# Patient Record
Sex: Female | Born: 1995 | Race: White | Hispanic: No | Marital: Single | Smoking: Former smoker
Health system: Southern US, Community
[De-identification: ages and names within clinical notes are randomized; demographics above are authoritative.]

## PROBLEM LIST (undated history)

## (undated) DIAGNOSIS — Z8679 Personal history of other diseases of the circulatory system: Secondary | ICD-10-CM

## (undated) DIAGNOSIS — T7840XA Allergy, unspecified, initial encounter: Secondary | ICD-10-CM

## (undated) DIAGNOSIS — Z86711 Personal history of pulmonary embolism: Secondary | ICD-10-CM

## (undated) DIAGNOSIS — F909 Attention-deficit hyperactivity disorder, unspecified type: Secondary | ICD-10-CM

## (undated) HISTORY — DX: Attention-deficit hyperactivity disorder, unspecified type: F90.9

## (undated) HISTORY — PX: NO PAST SURGERIES: SHX2092

## (undated) HISTORY — DX: Personal history of other diseases of the circulatory system: Z86.79

## (undated) HISTORY — DX: Allergy, unspecified, initial encounter: T78.40XA

## (undated) HISTORY — DX: Personal history of pulmonary embolism: Z86.711

---
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
## 2013-02-05 ENCOUNTER — Institutional Professional Consult (permissible substitution): Payer: BC Managed Care – PPO | Admitting: Pediatrics

## 2013-04-21 ENCOUNTER — Institutional Professional Consult (permissible substitution) (INDEPENDENT_AMBULATORY_CARE_PROVIDER_SITE_OTHER): Payer: BC Managed Care – PPO | Admitting: Family

## 2013-04-21 DIAGNOSIS — F909 Attention-deficit hyperactivity disorder, unspecified type: Secondary | ICD-10-CM

## 2013-09-01 ENCOUNTER — Institutional Professional Consult (permissible substitution) (INDEPENDENT_AMBULATORY_CARE_PROVIDER_SITE_OTHER): Payer: BC Managed Care – PPO | Admitting: Family

## 2013-09-01 DIAGNOSIS — F909 Attention-deficit hyperactivity disorder, unspecified type: Secondary | ICD-10-CM

## 2014-01-20 ENCOUNTER — Institutional Professional Consult (permissible substitution) (INDEPENDENT_AMBULATORY_CARE_PROVIDER_SITE_OTHER): Payer: BC Managed Care – PPO | Admitting: Family

## 2014-01-20 DIAGNOSIS — F902 Attention-deficit hyperactivity disorder, combined type: Secondary | ICD-10-CM

## 2014-01-20 DIAGNOSIS — F8181 Disorder of written expression: Secondary | ICD-10-CM

## 2014-04-16 ENCOUNTER — Institutional Professional Consult (permissible substitution) (INDEPENDENT_AMBULATORY_CARE_PROVIDER_SITE_OTHER): Payer: BLUE CROSS/BLUE SHIELD | Admitting: Family

## 2014-04-16 DIAGNOSIS — F8181 Disorder of written expression: Secondary | ICD-10-CM | POA: Diagnosis not present

## 2014-04-16 DIAGNOSIS — F902 Attention-deficit hyperactivity disorder, combined type: Secondary | ICD-10-CM | POA: Diagnosis not present

## 2014-08-20 ENCOUNTER — Institutional Professional Consult (permissible substitution) (INDEPENDENT_AMBULATORY_CARE_PROVIDER_SITE_OTHER): Payer: BLUE CROSS/BLUE SHIELD | Admitting: Family

## 2014-08-20 DIAGNOSIS — F900 Attention-deficit hyperactivity disorder, predominantly inattentive type: Secondary | ICD-10-CM | POA: Diagnosis not present

## 2014-08-20 DIAGNOSIS — F8181 Disorder of written expression: Secondary | ICD-10-CM | POA: Diagnosis not present

## 2014-12-08 ENCOUNTER — Institutional Professional Consult (permissible substitution) (INDEPENDENT_AMBULATORY_CARE_PROVIDER_SITE_OTHER): Payer: BLUE CROSS/BLUE SHIELD | Admitting: Family

## 2014-12-08 DIAGNOSIS — F8181 Disorder of written expression: Secondary | ICD-10-CM | POA: Diagnosis not present

## 2014-12-08 DIAGNOSIS — F900 Attention-deficit hyperactivity disorder, predominantly inattentive type: Secondary | ICD-10-CM | POA: Diagnosis not present

## 2015-03-09 ENCOUNTER — Institutional Professional Consult (permissible substitution) (INDEPENDENT_AMBULATORY_CARE_PROVIDER_SITE_OTHER): Payer: BLUE CROSS/BLUE SHIELD | Admitting: Family

## 2015-03-09 DIAGNOSIS — F8181 Disorder of written expression: Secondary | ICD-10-CM

## 2015-03-09 DIAGNOSIS — F902 Attention-deficit hyperactivity disorder, combined type: Secondary | ICD-10-CM | POA: Diagnosis not present

## 2015-06-23 ENCOUNTER — Institutional Professional Consult (permissible substitution): Payer: Self-pay | Admitting: Family

## 2015-06-23 ENCOUNTER — Telehealth: Payer: Self-pay | Admitting: Family

## 2015-06-23 NOTE — Telephone Encounter (Signed)
Mom called and canceled due to family  emergency .

## 2015-06-28 ENCOUNTER — Encounter: Payer: Self-pay | Admitting: Family

## 2015-06-28 ENCOUNTER — Ambulatory Visit (INDEPENDENT_AMBULATORY_CARE_PROVIDER_SITE_OTHER): Payer: BLUE CROSS/BLUE SHIELD | Admitting: Family

## 2015-06-28 VITALS — BP 122/70 | HR 78 | Resp 20 | Ht 67.75 in | Wt 151.2 lb

## 2015-06-28 DIAGNOSIS — R278 Other lack of coordination: Secondary | ICD-10-CM | POA: Diagnosis not present

## 2015-06-28 DIAGNOSIS — F902 Attention-deficit hyperactivity disorder, combined type: Secondary | ICD-10-CM

## 2015-06-28 MED ORDER — BUPROPION HCL 75 MG PO TABS: 75.0000 mg | ORAL_TABLET | Freq: Two times a day (BID) | ORAL | Status: DC

## 2015-06-28 MED ORDER — LISDEXAMFETAMINE DIMESYLATE 50 MG PO CAPS: 50.0000 mg | ORAL_CAPSULE | Freq: Every day | ORAL | Status: DC

## 2015-06-28 NOTE — Progress Notes (Signed)
[facility] [address] [place] [address] Dept: [phone] Dept Fax: [phone] Loc: [phone] Loc Fax: [phone]  Medical Follow-up  Patient ID: Phyllis Goodman, female  DOB: 03-23-1995, 20 y.o.  MRN: [id number]  Date of Evaluation: 06/28/15   PCP: Mickie HillierLITTLE,KEVIN LORNE, MD  Accompanied by: Self and mother Patient Lives with: parents  HISTORY/CURRENT STATUS:  HPI  Patient here for routine follow up related to ADHD and medication management. Polite and cooperative at today's visit. Patient taking medication-Vyvanse 50 mg 2 daily most work/school days without side effects for adverse effects.   EDUCATION: School: [employer] Year/Grade: Freshman Homework Time: Increased time depending on class demands Performance/Grades: below average Services: IEP/504 Plan and Other: tutoring Activities/Exercise: intermittently Current Exercise Habits: Home exercise routine, Type of exercise: walking, Time (Minutes): 30, Frequency (Times/Week): 3, Weekly Exercise (Minutes/Week): 90, Intensity: Mild Exercise limited by: None identified  MEDICAL HISTORY: Appetite: Ok MVI/Other: None Fruits/Vegs:Some Calcium: Some Iron:Some  Sleep: Bedtime: 10-Midnight Awakens: 7-8;30 am Sleep Concerns: Initiation/Maintenance/Other: No problems  Individual Medical History/Review of System Changes? No  Allergies: Review of patient's allergies indicates no known allergies.  Current Medications:  Current outpatient prescriptions:  .  lisdexamfetamine (VYVANSE) 50 MG capsule, Take 50 mg by mouth daily. Take 2 tablets by mouth daily, Disp: , Rfl:  Medication Side Effects: None  Family Medical/Social History Changes?: No  MENTAL HEALTH: Mental Health Issues: None reported  Depression screen [facility] 2/9 06/28/2015 06/28/2015  Decreased Interest - 0  Down, Depressed, Hopeless  1 0  PHQ - 2 Score 1 0     PHYSICAL EXAM: Vitals:  Today's Vitals   06/28/15 1103  Height: 5' 7.75" (1.721 m)  Weight: 151 lb 3.2 oz (68.584 kg)  , Facility age limit for growth percentiles is 20 years.  General Exam: Physical Exam  Constitutional: She is oriented to person, place, and time. She appears well-developed and well-nourished.  HENT:  Head: Normocephalic and atraumatic.  Right Ear: External ear normal.  Left Ear: External ear normal.  Nose: Nose normal.  Mouth/Throat: Oropharynx is clear and moist.  Eyes: Conjunctivae and EOM are normal. Pupils are equal, round, and reactive to light.  Neck: Normal range of motion. Neck supple.  Cardiovascular: Normal rate, regular rhythm, normal heart sounds and intact distal pulses.   Pulmonary/Chest: Effort normal and breath sounds normal.  Abdominal: Soft. Bowel sounds are normal.  Musculoskeletal: Normal range of motion.  Neurological: She is alert and oriented to person, place, and time. She has normal reflexes.  Skin: Skin is warm and dry.  Psychiatric: She has a normal mood and affect. Her behavior is normal. Judgment and thought content normal.  Vitals reviewed.   Neurological: oriented to time, place, and person Cranial Nerves: normal  Neuromuscular:  Motor Mass: Normal Tone: Normal Strength: Normal DTRs: 2+ and symmetric Overflow: None Reflexes: no tremors noted Sensory Exam: Vibratory: Intact  Fine Touch: Intact  Testing/Developmental Screens: ASRS-15/10 mins    DIAGNOSES:    ICD-9-CM ICD-10-CM   1. ADHD (attention deficit hyperactivity disorder), combined type 314.01 F90.2   2. Dysgraphia 781.3 R27.8     RECOMMENDATIONS: 3 month follow up visit and continuation with medication.  Patient Instructions  Patient to start with Wellbutrin 75 mg 1 daily in the am for 7-10 days and can increase to 1 tablet twice daily. Reviewed use, dose, and side effects of medication. Will continue with Vyvanse 50 mg  2  capsules daily, as previous. To return in about 8 weeks for medication management of Wellbutrin. To call prior to the appointment if concerns with side effects or adverse effects of medication.    Continuation of daily oral hygiene to include flossing and brushing daily, using antimicrobial toothpaste, as well as routine dental exams and twice yearly cleaning.  Recommend supplementation with a  multivitamin and omega-3 fatty acids daily.  Maintain adequate intake of Calcium and Vitamin D.  NEXT APPOINTMENT: No Follow-up on file.  More than 50% of the appointment was spent counseling and discussing diagnosis and management of symptoms with the patient and family.   Carron Curie, NP Counseling Time: 30 mins Total Contact Time: 40 mins

## 2015-06-28 NOTE — Patient Instructions (Addendum)
Patient to start with Wellbutrin 75 mg 1 daily in the am for 7-10 days and can increase to 1 tablet twice daily. Reviewed use, dose, and side effects of medication. Will continue with Vyvanse 50 mg 2 capsules daily, as previous. To return in about 8 weeks for medication management of Wellbutrin. To call prior to the appointment if concerns with side effects or adverse effects of medication.

## 2015-07-14 ENCOUNTER — Ambulatory Visit: Payer: Self-pay | Admitting: Women's Health

## 2015-07-24 DIAGNOSIS — F4323 Adjustment disorder with mixed anxiety and depressed mood: Secondary | ICD-10-CM | POA: Diagnosis not present

## 2015-07-28 ENCOUNTER — Ambulatory Visit: Payer: Self-pay | Admitting: Women's Health

## 2015-07-28 ENCOUNTER — Encounter: Payer: Self-pay | Admitting: Women's Health

## 2015-07-28 ENCOUNTER — Telehealth: Payer: Self-pay

## 2015-07-28 ENCOUNTER — Ambulatory Visit (INDEPENDENT_AMBULATORY_CARE_PROVIDER_SITE_OTHER): Payer: BLUE CROSS/BLUE SHIELD | Admitting: Women's Health

## 2015-07-28 VITALS — BP 115/69 | Ht 67.0 in | Wt 154.4 lb

## 2015-07-28 DIAGNOSIS — Z01419 Encounter for gynecological examination (general) (routine) without abnormal findings: Secondary | ICD-10-CM | POA: Diagnosis not present

## 2015-07-28 DIAGNOSIS — F1911 Other psychoactive substance abuse, in remission: Secondary | ICD-10-CM | POA: Insufficient documentation

## 2015-07-28 DIAGNOSIS — Z30011 Encounter for initial prescription of contraceptive pills: Secondary | ICD-10-CM | POA: Diagnosis not present

## 2015-07-28 DIAGNOSIS — Z113 Encounter for screening for infections with a predominantly sexual mode of transmission: Secondary | ICD-10-CM

## 2015-07-28 DIAGNOSIS — F1990 Other psychoactive substance use, unspecified, uncomplicated: Secondary | ICD-10-CM

## 2015-07-28 DIAGNOSIS — R87610 Atypical squamous cells of undetermined significance on cytologic smear of cervix (ASC-US): Secondary | ICD-10-CM | POA: Diagnosis not present

## 2015-07-28 LAB — CBC WITH DIFFERENTIAL/PLATELET
BASOS ABS: 0 {cells}/uL (ref 0–200)
Basophils Relative: 0 %
Eosinophils Absolute: 124 cells/uL (ref 15–500)
Eosinophils Relative: 2 %
HEMATOCRIT: 41.8 % (ref 35.0–45.0)
HEMOGLOBIN: 13.9 g/dL (ref 11.7–15.5)
LYMPHS ABS: 1860 {cells}/uL (ref 850–3900)
Lymphocytes Relative: 30 %
MCH: 31.3 pg (ref 27.0–33.0)
MCHC: 33.3 g/dL (ref 32.0–36.0)
MCV: 94.1 fL (ref 80.0–100.0)
MONO ABS: 558 {cells}/uL (ref 200–950)
MPV: 10 fL (ref 7.5–12.5)
Monocytes Relative: 9 %
NEUTROS ABS: 3658 {cells}/uL (ref 1500–7800)
NEUTROS PCT: 59 %
Platelets: 316 10*3/uL (ref 140–400)
RBC: 4.44 MIL/uL (ref 3.80–5.10)
RDW: 13.3 % (ref 11.0–15.0)
WBC: 6.2 10*3/uL (ref 3.8–10.8)

## 2015-07-28 MED ORDER — NORETHIN ACE-ETH ESTRAD-FE 1-20 MG-MCG PO TABS: 1.0000 | ORAL_TABLET | Freq: Every day | ORAL | Status: DC

## 2015-07-28 NOTE — Telephone Encounter (Signed)
Mom called. 20 yo patient has her first gyn visit with you at 2pm today.  Mom wanted to make you aware of some things. She said patient is using drugs. "Drugs like heroin".  We are trying to help her and she is seeing a Veterinary surgeoncounselor.  She said that patient does not tell the truth all the time and is very good at convincing.  She said that patient needs birth control but is very forgetful.  She is on Vyvanse (sp?) every day and cannot remember to take that daily. She hopes that birth control method will be something that will not require her to remember to take it.   You do not have to call Mom. She just wanted you to be aware.

## 2015-07-28 NOTE — Patient Instructions (Signed)
Health Maintenance, Female Adopting a healthy lifestyle and getting preventive care can go a long way to promote health and wellness. Talk with your health care provider about what schedule of regular examinations is right for you. This is a good chance for you to check in with your provider about disease prevention and staying healthy. In between checkups, there are plenty of things you can do on your own. Experts have done a lot of research about which lifestyle changes and preventive measures are most likely to keep you healthy. Ask your health care provider for more information. WEIGHT AND DIET  Eat a healthy diet  Be sure to include plenty of vegetables, fruits, low-fat dairy products, and lean protein.  Do not eat a lot of foods high in solid fats, added sugars, or salt.  Get regular exercise. This is one of the most important things you can do for your health.  Most adults should exercise for at least 150 minutes each week. The exercise should increase your heart rate and make you sweat (moderate-intensity exercise).  Most adults should also do strengthening exercises at least twice a week. This is in addition to the moderate-intensity exercise.  Maintain a healthy weight  Body mass index (BMI) is a measurement that can be used to identify possible weight problems. It estimates body fat based on height and weight. Your health care provider can help determine your BMI and help you achieve or maintain a healthy weight.  For females 20 years of age and older:   A BMI below 18.5 is considered underweight.  A BMI of 18.5 to 24.9 is normal.  A BMI of 25 to 29.9 is considered overweight.  A BMI of 30 and above is considered obese.  Watch levels of cholesterol and blood lipids  You should start having your blood tested for lipids and cholesterol at 22 years of age, then have this test every 5 years.  You may need to have your cholesterol levels checked more often if:  Your lipid  or cholesterol levels are high.  You are older than 22 years of age.  You are at high risk for heart disease.  CANCER SCREENING   Lung Cancer  Lung cancer screening is recommended for adults 55-80 years old who are at high risk for lung cancer because of a history of smoking.  A yearly low-dose CT scan of the lungs is recommended for people who:  Currently smoke.  Have quit within the past 15 years.  Have at least a 30-pack-year history of smoking. A pack year is smoking an average of one pack of cigarettes a day for 1 year.  Yearly screening should continue until it has been 15 years since you quit.  Yearly screening should stop if you develop a health problem that would prevent you from having lung cancer treatment.  Breast Cancer  Practice breast self-awareness. This means understanding how your breasts normally appear and feel.  It also means doing regular breast self-exams. Let your health care provider know about any changes, no matter how small.  If you are in your 20s or 30s, you should have a clinical breast exam (CBE) by a health care provider every 1-3 years as part of a regular health exam.  If you are 40 or older, have a CBE every year. Also consider having a breast X-ray (mammogram) every year.  If you have a family history of breast cancer, talk to your health care provider about genetic screening.  If you   are at high risk for breast cancer, talk to your health care provider about having an MRI and a mammogram every year.  Breast cancer gene (BRCA) assessment is recommended for women who have family members with BRCA-related cancers. BRCA-related cancers include:  Breast.  Ovarian.  Tubal.  Peritoneal cancers.  Results of the assessment will determine the need for genetic counseling and BRCA1 and BRCA2 testing. Cervical Cancer Your health care provider may recommend that you be screened regularly for cancer of the pelvic organs (ovaries, uterus, and  vagina). This screening involves a pelvic examination, including checking for microscopic changes to the surface of your cervix (Pap test). You may be encouraged to have this screening done every 3 years, beginning at age 21.  For women ages 30-65, health care providers may recommend pelvic exams and Pap testing every 3 years, or they may recommend the Pap and pelvic exam, combined with testing for human papilloma virus (HPV), every 5 years. Some types of HPV increase your risk of cervical cancer. Testing for HPV may also be done on women of any age with unclear Pap test results.  Other health care providers may not recommend any screening for nonpregnant women who are considered low risk for pelvic cancer and who do not have symptoms. Ask your health care provider if a screening pelvic exam is right for you.  If you have had past treatment for cervical cancer or a condition that could lead to cancer, you need Pap tests and screening for cancer for at least 20 years after your treatment. If Pap tests have been discontinued, your risk factors (such as having a new sexual partner) need to be reassessed to determine if screening should resume. Some women have medical problems that increase the chance of getting cervical cancer. In these cases, your health care provider may recommend more frequent screening and Pap tests. Colorectal Cancer  This type of cancer can be detected and often prevented.  Routine colorectal cancer screening usually begins at 22 years of age and continues through 22 years of age.  Your health care provider may recommend screening at an earlier age if you have risk factors for colon cancer.  Your health care provider may also recommend using home test kits to check for hidden blood in the stool.  A small camera at the end of a tube can be used to examine your colon directly (sigmoidoscopy or colonoscopy). This is done to check for the earliest forms of colorectal  cancer.  Routine screening usually begins at age 50.  Direct examination of the colon should be repeated every 5-10 years through 22 years of age. However, you may need to be screened more often if early forms of precancerous polyps or small growths are found. Skin Cancer  Check your skin from head to toe regularly.  Tell your health care provider about any new moles or changes in moles, especially if there is a change in a mole's shape or color.  Also tell your health care provider if you have a mole that is larger than the size of a pencil eraser.  Always use sunscreen. Apply sunscreen liberally and repeatedly throughout the day.  Protect yourself by wearing long sleeves, pants, a wide-brimmed hat, and sunglasses whenever you are outside. HEART DISEASE, DIABETES, AND HIGH BLOOD PRESSURE   High blood pressure causes heart disease and increases the risk of stroke. High blood pressure is more likely to develop in:  People who have blood pressure in the high end   of the normal range (130-139/85-89 mm Hg).  People who are overweight or obese.  People who are African American.  If you are 38-23 years of age, have your blood pressure checked every 3-5 years. If you are 61 years of age or older, have your blood pressure checked every year. You should have your blood pressure measured twice--once when you are at a hospital or clinic, and once when you are not at a hospital or clinic. Record the average of the two measurements. To check your blood pressure when you are not at a hospital or clinic, you can use:  An automated blood pressure machine at a pharmacy.  A home blood pressure monitor.  If you are between 45 years and 39 years old, ask your health care provider if you should take aspirin to prevent strokes.  Have regular diabetes screenings. This involves taking a blood sample to check your fasting blood sugar level.  If you are at a normal weight and have a low risk for diabetes,  have this test once every three years after 22 years of age.  If you are overweight and have a high risk for diabetes, consider being tested at a younger age or more often. PREVENTING INFECTION  Hepatitis B  If you have a higher risk for hepatitis B, you should be screened for this virus. You are considered at high risk for hepatitis B if:  You were born in a country where hepatitis B is common. Ask your health care provider which countries are considered high risk.  Your parents were born in a high-risk country, and you have not been immunized against hepatitis B (hepatitis B vaccine).  You have HIV or AIDS.  You use needles to inject street drugs.  You live with someone who has hepatitis B.  You have had sex with someone who has hepatitis B.  You get hemodialysis treatment.  You take certain medicines for conditions, including cancer, organ transplantation, and autoimmune conditions. Hepatitis C  Blood testing is recommended for:  Everyone born from [birth year] through 1967.  Anyone with known risk factors for hepatitis C. Sexually transmitted infections (STIs)  You should be screened for sexually transmitted infections (STIs) including gonorrhea and chlamydia if:  You are sexually active and are younger than 22 years of age.  You are older than 22 years of age and your health care provider tells you that you are at risk for this type of infection.  Your sexual activity has changed since you were last screened and you are at an increased risk for chlamydia or gonorrhea. Ask your health care provider if you are at risk.  If you do not have HIV, but are at risk, it may be recommended that you take a prescription medicine daily to prevent HIV infection. This is called pre-exposure prophylaxis (PrEP). You are considered at risk if:  You are sexually active and do not regularly use condoms or know the HIV status of your partner(s).  You take drugs by injection.  You are sexually  active with a partner who has HIV. Talk with your health care provider about whether you are at high risk of being infected with HIV. If you choose to begin PrEP, you should first be tested for HIV. You should then be tested every 3 months for as long as you are taking PrEP.  PREGNANCY   If you are premenopausal and you may become pregnant, ask your health care provider about preconception counseling.  If you may  become pregnant, take 400 to 800 micrograms (mcg) of folic acid every day.  If you want to prevent pregnancy, talk to your health care provider about birth control (contraception). OSTEOPOROSIS AND MENOPAUSE   Osteoporosis is a disease in which the bones lose minerals and strength with aging. This can result in serious bone fractures. Your risk for osteoporosis can be identified using a bone density scan.  If you are 65 years of age or older, or if you are at risk for osteoporosis and fractures, ask your health care provider if you should be screened.  Ask your health care provider whether you should take a calcium or vitamin D supplement to lower your risk for osteoporosis.  Menopause may have certain physical symptoms and risks.  Hormone replacement therapy may reduce some of these symptoms and risks. Talk to your health care provider about whether hormone replacement therapy is right for you.  HOME CARE INSTRUCTIONS   Schedule regular health, dental, and eye exams.  Stay current with your immunizations.   Do not use any tobacco products including cigarettes, chewing tobacco, or electronic cigarettes.  If you are pregnant, do not drink alcohol.  If you are breastfeeding, limit how much and how often you drink alcohol.  Limit alcohol intake to no more than 1 drink per day for nonpregnant women. One drink equals 12 ounces of beer, 5 ounces of wine, or 1 ounces of hard liquor.  Do not use street drugs.  Do not share needles.  Ask your health care provider for help if  you need support or information about quitting drugs.  Tell your health care provider if you often feel depressed.  Tell your health care provider if you have ever been abused or do not feel safe at home.   This information is not intended to replace advice given to you by your health care provider. Make sure you discuss any questions you have with your health care provider.   Document Released: 07/25/2010 Document Revised: 01/30/2014 Document Reviewed: 12/11/2012 Elsevier Interactive Patient Education 2016 Else[employer] Inc[employer]       Contraception Choices Contraception (birth control) is the use of any methods or devices to prevent pregnancy. Below are some methods to help avoid pregnancy. HORMONAL METHODS   Contraceptive implant. This is a thin, plastic tube containing progesterone hormone. It does not contain estrogen hormone. Your health care provider inserts the tube in the inner part of the upper arm. The tube can remain in place for up to 3 years. After 3 years, the implant must be removed. The implant prevents the ovaries from releasing an egg (ovulation), thickens the cervical mucus to prevent sperm from entering the uterus, and thins the lining of the inside of the uterus.  Progesterone-only injections. These injections are given every 3 months by your health care provider to prevent pregnancy. This synthetic progesterone hormone stops the ovaries from releasing eggs. It also thickens cervical mucus and changes the uterine lining. This makes it harder for sperm to survive in the uterus.  Birth control pills. These pills contain estrogen and progesterone hormone. They work by preventing the ovaries from releasing eggs (ovulation). They also cause the cervical mucus to thicken, preventing the sperm from entering the uterus. Birth control pills are prescribed by a health care provider.Birth control pills can also be used to treat heavy periods.  Minipill. This type of birth control pill  contains only the progesterone hormone. They are taken every day of each month and must be prescribed   by your health care provider.  Birth control patch. The patch contains hormones similar to those in birth control pills. It must be changed once a week and is prescribed by a health care provider.  Vaginal ring. The ring contains hormones similar to those in birth control pills. It is left in the vagina for 3 weeks, removed for 1 week, and then a new one is put back in place. The patient must be comfortable inserting and removing the ring from the vagina.A health care provider's prescription is necessary.  Emergency contraception. Emergency contraceptives prevent pregnancy after unprotected sexual intercourse. This pill can be taken right after sex or up to 5 days after unprotected sex. It is most effective the sooner you take the pills after having sexual intercourse. Most emergency contraceptive pills are available without a prescription. Check with your pharmacist. Do not use emergency contraception as your only form of birth control. BARRIER METHODS   Female condom. This is a thin sheath (latex or rubber) that is worn over the penis during sexual intercourse. It can be used with spermicide to increase effectiveness.  Female condom. This is a soft, loose-fitting sheath that is put into the vagina before sexual intercourse.  Diaphragm. This is a soft, latex, dome-shaped barrier that must be fitted by a health care provider. It is inserted into the vagina, along with a spermicidal jelly. It is inserted before intercourse. The diaphragm should be left in the vagina for 6 to 8 hours after intercourse.  Cervical cap. This is a round, soft, latex or plastic cup that fits over the cervix and must be fitted by a health care provider. The cap can be left in place for up to 48 hours after intercourse.  Sponge. This is a soft, circular piece of polyurethane foam. The sponge has spermicide in it. It is  inserted into the vagina after wetting it and before sexual intercourse.  Spermicides. These are chemicals that kill or block sperm from entering the cervix and uterus. They come in the form of creams, jellies, suppositories, foam, or tablets. They do not require a prescription. They are inserted into the vagina with an applicator before having sexual intercourse. The process must be repeated every time you have sexual intercourse. INTRAUTERINE CONTRACEPTION  Intrauterine device (IUD). This is a T-shaped device that is put in a woman's uterus during a menstrual period to prevent pregnancy. There are 2 types:  Copper IUD. This type of IUD is wrapped in copper wire and is placed inside the uterus. Copper makes the uterus and fallopian tubes produce a fluid that kills sperm. It can stay in place for 10 years.  Hormone IUD. This type of IUD contains the hormone progestin (synthetic progesterone). The hormone thickens the cervical mucus and prevents sperm from entering the uterus, and it also thins the uterine lining to prevent implantation of a fertilized egg. The hormone can weaken or kill the sperm that get into the uterus. It can stay in place for 3-5 years, depending on which type of IUD is used. PERMANENT METHODS OF CONTRACEPTION  Female tubal ligation. This is when the woman's fallopian tubes are surgically sealed, tied, or blocked to prevent the egg from traveling to the uterus.  Hysteroscopic sterilization. This involves placing a small coil or insert into each fallopian tube. Your doctor uses a technique called hysteroscopy to do the procedure. The device causes scar tissue to form. This results in permanent blockage of the fallopian tubes, so the sperm cannot fertilize the   after the procedure for the tubes to become blocked. You must use another form of birth control for these 3 months.  Female sterilization. This is when the female has the tubes that carry sperm tied off  (vasectomy).This blocks sperm from entering the vagina during sexual intercourse. After the procedure, the man can still ejaculate fluid (semen). NATURAL PLANNING METHODS  Natural family planning. This is not having sexual intercourse or using a barrier method (condom, diaphragm, cervical cap) on days the woman could become pregnant.  Calendar method. This is keeping track of the length of each menstrual cycle and identifying when you are fertile.  Ovulation method. This is avoiding sexual intercourse during ovulation.  Symptothermal method. This is avoiding sexual intercourse during ovulation, using a thermometer and ovulation symptoms.  Post-ovulation method. This is timing sexual intercourse after you have ovulated. Regardless of which type or method of contraception you choose, it is important that you use condoms to protect against the transmission of sexually transmitted infections (STIs). Talk with your health care provider about which form of contraception is most appropriate for you.   This information is not intended to replace advice given to you by your health care provider. Make sure you discuss any questions you have with your health care provider.   Document Released: 01/09/2005 Document Revised: 01/14/2013 Document Reviewed: 07/04/2012 Elsevier Interactive Patient Education [employer].

## 2015-07-28 NOTE — Progress Notes (Signed)
Phyllis Goodman 1995-08-04 [id number]    History:    Presents for annual exam.  Monthly cycle/condoms/ same partner for greater than one year, first GYN exam. Gardasil series completed. 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 Denies marijuana, cocaine, meth, other drugs and reports rare alcohol use.  Past medical history, past surgical history, family history and social history were all reviewed and documented in the EPIC chart. [facility] [employer] in [employer]ology doing okay. Lives at home. Father alcoholism.  ROS:  A ROS was performed and pertinent positives and negatives are included.  Exam:  Filed Vitals:   07/28/15 1421  BP: 115/69    General appearance:  Normal Thyroid:  Symmetrical, normal in size, without palpable masses or nodularity. Respiratory  Auscultation:  Clear without wheezing or rhonchi Cardiovascular  Auscultation:  Regular rate, without rubs, murmurs or gallops  Edema/varicosities:  Not grossly evident Abdominal  Soft,nontender, without masses, guarding or rebound.  Liver/spleen:  No organomegaly noted  Hernia:  None appreciated  Skin  Inspection:  Grossly normal   Breasts: Examined lying and sitting.     Right: Without masses, retractions, discharge or axillary adenopathy.     Left: Without masses, retractions, discharge or axillary adenopathy. Gentitourinary   Inguinal/mons:  Normal without inguinal adenopathy  External genitalia:  Normal  BUS/Urethra/Skene's glands:  Normal  Vagina:  Normal  Cervix:  Normal  Uterus:   normal in size, shape and contour.  Midline and mobile  Adnexa/parametria:     Rt: Without masses or tenderness.   Lt: Without masses or tenderness.  Anus and perineum: Normal    Assessment/Plan:  20 y.o. S [place] G0 for annual exam with no complaints  Monthly cycle/condoms STD screen Past drug abuse history ADD-counseling and Vyvanse  Plan: Contraception options reviewed, encouraged long-term contraception, declines at  this time would like to try pills. Loestrin 1/20 prescription, proper use, slight risk for blood clots and strokes. Start up instructions reviewed. Strongly encouraged condoms until permanent partner. Instructed to call if difficulty remembering pills. SBE's, exercise, calcium rich diet, MVI daily encouraged. Campus safety reviewed. Reviewed importance of no drugs or alcohol family history of alcoholism. Reviewed importance of no heroin, cases of heroin laced with other drugs causing death. Encouraged to continue counseling, call if needs other support systems for avoiding drug use. CBC, UA, GC/Chlamydia, HIV, hepatitis A, B, and C, RPR, Pap.  Harrington ChallengerYOUNG,Treyson Axel J [facility] ArlingtonWHNP, 4:42 PM 07/28/2015

## 2015-07-29 ENCOUNTER — Telehealth: Payer: Self-pay

## 2015-07-29 LAB — URINALYSIS W MICROSCOPIC + REFLEX CULTURE
BILIRUBIN URINE: NEGATIVE
Casts: NONE SEEN [LPF]
GLUCOSE, UA: NEGATIVE
Hgb urine dipstick: NEGATIVE
KETONES UR: NEGATIVE
LEUKOCYTES UA: NEGATIVE
Nitrite: NEGATIVE
PROTEIN: NEGATIVE
Specific Gravity, Urine: 1.021 (ref 1.001–1.035)
Yeast: NONE SEEN [HPF]
pH: 8 (ref 5.0–8.0)

## 2015-07-29 LAB — PAP IG W/ RFLX HPV ASCU

## 2015-07-29 LAB — GC/CHLAMYDIA PROBE AMP
CT PROBE, AMP APTIMA: NOT DETECTED
GC PROBE AMP APTIMA: NOT DETECTED

## 2015-07-29 LAB — HEPATITIS C ANTIBODY: HCV AB: NEGATIVE

## 2015-07-29 LAB — HIV ANTIBODY (ROUTINE TESTING W REFLEX): HIV: NONREACTIVE

## 2015-07-29 LAB — RPR

## 2015-07-29 LAB — HEPATITIS B SURFACE ANTIGEN: Hepatitis B Surface Ag: NEGATIVE

## 2015-07-29 NOTE — Telephone Encounter (Signed)
Mom is asking if you will call her.  She wants to ask a couple of questions about daughter being seen yesterday and "follow up on the exam".  There is no DPR access on file and unfortunately no note about it at all. IT does not say patient denied access but it does not state she gave permission to talk to mom either.  I will be happy to tell Mom this.  I know you said yesterday how much you thought of this mother and I just wanted to run it by you first.

## 2015-07-30 LAB — URINE CULTURE
Colony Count: NO GROWTH
Organism ID, Bacteria: NO GROWTH

## 2015-07-30 NOTE — Telephone Encounter (Signed)
Harriett SineNancy, Do I need to call Mom and tell her "Drug screen cannot be done. Mostly done in a controlled lab."?

## 2015-07-30 NOTE — Telephone Encounter (Signed)
Drug screen cannot be done mostly done in a controlled lab

## 2015-07-30 NOTE — Telephone Encounter (Signed)
Yes please call  I tried to leave a message on Debbie cell phone that we cannot do it. Because must becontrolled lab, but let her know there are over-the-counter urine tests for drug testing

## 2015-07-30 NOTE — Telephone Encounter (Signed)
Telephone call questions answered, questions if a drug screen can be done.

## 2015-07-30 NOTE — Telephone Encounter (Signed)
Patient's mom, Eunice BlaseDebbie, informed.

## 2015-08-02 LAB — HUMAN PAPILLOMAVIRUS, HIGH RISK: HPV DNA High Risk: NOT DETECTED

## 2015-08-07 DIAGNOSIS — F4320 Adjustment disorder, unspecified: Secondary | ICD-10-CM | POA: Diagnosis not present

## 2015-08-12 DIAGNOSIS — F4320 Adjustment disorder, unspecified: Secondary | ICD-10-CM | POA: Diagnosis not present

## 2015-08-20 DIAGNOSIS — F4320 Adjustment disorder, unspecified: Secondary | ICD-10-CM | POA: Diagnosis not present

## 2015-08-23 ENCOUNTER — Institutional Professional Consult (permissible substitution): Payer: BLUE CROSS/BLUE SHIELD | Admitting: Family

## 2015-08-24 ENCOUNTER — Other Ambulatory Visit: Payer: Self-pay | Admitting: Family

## 2015-08-24 ENCOUNTER — Telehealth: Payer: Self-pay | Admitting: *Deleted

## 2015-08-24 NOTE — Telephone Encounter (Signed)
Pt mother Eunice Blase called asked if normal to having spotting after starting birth control pill,I explained to mother it is normal as it takes about 3 months for body to adjust to new medication. To call if bleeding heavy changing tampon/pad every 1 hour or pain. Pt mother verbalized she understood.

## 2015-08-25 ENCOUNTER — Encounter: Payer: Self-pay | Admitting: Women's Health

## 2015-10-21 ENCOUNTER — Institutional Professional Consult (permissible substitution): Payer: Self-pay | Admitting: Family

## 2015-10-28 ENCOUNTER — Institutional Professional Consult (permissible substitution): Payer: Self-pay | Admitting: Family

## 2015-12-02 ENCOUNTER — Ambulatory Visit (INDEPENDENT_AMBULATORY_CARE_PROVIDER_SITE_OTHER): Payer: BLUE CROSS/BLUE SHIELD | Admitting: Family

## 2015-12-02 ENCOUNTER — Encounter: Payer: Self-pay | Admitting: Family

## 2015-12-02 VITALS — BP 112/68 | HR 68 | Resp 16 | Wt 168.0 lb

## 2015-12-02 DIAGNOSIS — F902 Attention-deficit hyperactivity disorder, combined type: Secondary | ICD-10-CM

## 2015-12-02 DIAGNOSIS — R278 Other lack of coordination: Secondary | ICD-10-CM

## 2015-12-02 DIAGNOSIS — Z87898 Personal history of other specified conditions: Secondary | ICD-10-CM

## 2015-12-02 DIAGNOSIS — F1911 Other psychoactive substance abuse, in remission: Secondary | ICD-10-CM

## 2015-12-02 MED ORDER — LISDEXAMFETAMINE DIMESYLATE 50 MG PO CAPS: 50.0000 mg | ORAL_CAPSULE | Freq: Every day | ORAL | 0 refills | Status: DC

## 2015-12-02 NOTE — Progress Notes (Signed)
[facility] [address], [address] [place] [id number] Dept: [phone] Dept Fax: [phone] Loc: [phone] Loc Fax: [phone]  Medical Follow-up  Patient ID: Phyllis Goodman, female  DOB: Jan 19, 1996, 20 y.o.  MRN: [id number]  Date of Evaluation: 12/02/15  PCP: Mickie HillierLITTLE,KEVIN LORNE, MD  Accompanied by: Mother Patient Lives with: parents  HISTORY/CURRENT STATUS:  HPI  Patient here for routine follow up related to ADHD and medication management. Patient here with mother for follow up visit and not consistently taking Vyvanse 50 mg 2 daily for the past week. No recent reported side effects per patient.   EDUCATION: Working: [employer] through a [employer]  Hours: Full-time Hours: 7:00-4:00 pm First week on the job. Activities/Exercise: None at this time.   MEDICAL HISTORY: Appetite: Eating mostly later in the day.  MVI/Other: None Fruits/Vegs:Not much Calcium: Good amount  Iron:Some  Sleep: Bedtime: 1-2:00 am most nights Awakens: 5:30 am Sleep Concerns: Initiation/Maintenance/Other: Not sleeping much at this time.   Individual Medical History/Review of System Changes? Yes, not been to primary care and had follow up visit with GYN.   Allergies: Patient has no known allergies.  Current Medications:  Current Outpatient Prescriptions:  .  lisdexamfetamine (VYVANSE) 50 MG capsule, Take 1 capsule (50 mg total) by mouth daily. Take 2 tablets by mouth daily, Disp: 180 capsule, Rfl: 0 Medication Side Effects: None  Family Medical/Social History Changes?: Recently, at home with parents and staying there unless at boyfriend's house.    MENTAL HEALTH: Mental Health Issues: Substance Abuse, Etoh  PHYSICAL EXAM: Vitals:  Today's Vitals   12/02/15 1511  Weight: 168 lb (76.2 kg)  , Facility age limit for growth percentiles is 20  years.  General Exam: Physical Exam  Constitutional: She is oriented to person, place, and time. She appears well-developed and well-nourished.  HENT:  Head: Normocephalic and atraumatic.  Right Ear: External ear normal.  Left Ear: External ear normal.  Mouth/Throat: Oropharynx is clear and moist.  Eyes: Conjunctivae and EOM are normal. Pupils are equal, round, and reactive to light.  Neck: Normal range of motion. Neck supple.  Cardiovascular: Normal rate, regular rhythm, normal heart sounds and intact distal pulses.   Pulmonary/Chest: Effort normal and breath sounds normal.  Abdominal: Soft. Bowel sounds are normal.  Musculoskeletal: Normal range of motion.  Neurological: She is alert and oriented to person, place, and time. She has normal reflexes.  Skin: Skin is warm and dry. Capillary refill takes less than 2 seconds.  Psychiatric: She has a normal mood and affect. Her behavior is normal. Judgment and thought content normal.  Vitals reviewed.   Neurological: oriented to time, place, and person Cranial Nerves: normal  Neuromuscular:  Motor Mass: Normal Tone: Normal Strength: Normal DTRs: 2+ and symmetric Overflow: None Reflexes: no tremors noted Sensory Exam: Vibratory: Intact  Fine Touch: Intact  Testing/Developmental Screens:  ASRS-19/14 scored by patient and reviewed     DIAGNOSES:    ICD-9-CM ICD-10-CM   1. ADHD (attention deficit hyperactivity disorder), combined type 314.01 F90.2   2. Dysgraphia 781.3 R27.8   3. History of drug abuse 305.93 Z87.898     RECOMMENDATIONS: 3 week follow up for medication management. Will restart Vyvanse 50 mg 2 daily, # 180 script printed and given to mother.  Patient has history of drug abuse and took drug test on Friday that was negative. Discussed medication and potential dangers with using  other illegal drugs in combination. To follow up with drug testing next appointment for continuity of care.  Teens need about 9 hours of  sleep a night. Younger children need more sleep (10-11 hours a night) and adults need slightly less (7-9 hours each night).  11 Tips to Follow:  1. No caffeine after 3pm: Avoid beverages with caffeine (soda, tea, energy drinks, etc.) especially after 3pm. 2. Don't go to bed hungry: Have your evening meal at least 3 hrs. before going to sleep. It's fine to have a small bedtime snack such as a glass of milk and a few crackers but don't have a big meal. 3. Have a nightly routine before bed: Plan on "winding down" before you go to sleep. Begin relaxing about 1 hour before you go to bed. Try doing a quiet activity such as listening to calming music, reading a book or meditating. 4. Turn off the TV and ALL electronics including video games, tablets, laptops, etc. 1 hour before sleep, and keep them out of the bedroom. 5. Turn off your cell phone and all notifications (new email and text alerts) or even better, leave your phone outside your room while you sleep. Studies have shown that a part of your brain continues to respond to certain lights and sounds even while you're still asleep. 6. Make your bedroom quiet, dark and cool. If you can't control the noise, try wearing earplugs or using a fan to block out other sounds. 7. Practice relaxation techniques. Try reading a book or meditating or drain your brain by writing a list of what you need to do the next day. 8. Don't nap unless you feel sick: you'll have a better night's sleep. 9. Don't smoke, or quit if you do. Nicotine, alcohol, and marijuana can all keep you awake. Talk to your health care provider if you need help with substance use. 10. Most importantly, wake up at the same time every day (or within 1 hour of your usual wake up time) EVEN on the weekends. A regular wake up time promotes sleep hygiene and prevents sleep problems. 11. Reduce exposure to bright light in the last three hours of the day before going to sleep. Maintaining good sleep hygiene  and having good sleep habits lower your risk of developing sleep problems. Getting better sleep can also improve your concentration and alertness. Try the simple steps in this guide. If you still have trouble getting enough rest, make an appointment with your health care provider.  Encouraged routine exercise at least 3-4 days/week for 20-30 minutes each day. Better food choices and smaller meals encouraged related to recent weight gain.  NEXT APPOINTMENT: Return in about 3 weeks (around 12/23/2015) for Medication follow up.  More than 50% of the appointment was spent counseling and discussing diagnosis and management of symptoms with the patient and family.  Carron Curieawn M Paretta-Leahey, NP Counseling Time: 30 mins Total Contact Time: 40 mins

## 2015-12-30 ENCOUNTER — Encounter: Payer: Self-pay | Admitting: Women's Health

## 2015-12-30 ENCOUNTER — Ambulatory Visit (INDEPENDENT_AMBULATORY_CARE_PROVIDER_SITE_OTHER): Payer: BLUE CROSS/BLUE SHIELD | Admitting: Women's Health

## 2015-12-30 VITALS — BP 118/78 | Ht 67.0 in | Wt 154.0 lb

## 2015-12-30 DIAGNOSIS — N912 Amenorrhea, unspecified: Secondary | ICD-10-CM

## 2015-12-30 LAB — PREGNANCY, URINE: Preg Test, Ur: POSITIVE — AB

## 2015-12-30 NOTE — Progress Notes (Signed)
Presents with positive home UPT. LMP October 4 normal cycle. 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 Denies other drugs, marijuana, Xanax or alcohol. Denies spotting, abdominal pain /cramping, nausea, vaginal discharge, urinary symptoms. Had been on Loestrin but had numerous missed pills.  Exam: Appears well. Mother accompanied.  9 Weeks per dates  Plan: Viability ultrasound, reviewed importance of no drugs, plain Tylenol, plain Robitussin if needed. Encouraged to stop Vyvanse. Prenatal vitamin daily encouraged. Reviewed importance of healthy lifestyle of healthy diet and exercise. Discouraged vaping.

## 2015-12-30 NOTE — Patient Instructions (Signed)

## 2015-12-31 ENCOUNTER — Ambulatory Visit: Payer: Self-pay | Admitting: Women's Health

## 2015-12-31 ENCOUNTER — Telehealth: Payer: Self-pay

## 2015-12-31 ENCOUNTER — Other Ambulatory Visit: Payer: Self-pay | Admitting: Women's Health

## 2015-12-31 MED ORDER — PRENATAL VITAMIN 27-0.8 MG PO TABS: 1.0000 | ORAL_TABLET | Freq: Every day | ORAL | 4 refills | Status: DC

## 2015-12-31 NOTE — Telephone Encounter (Signed)
Yes please call in prenatal vitamins 1 daily #90 with 4 refills. And yes it is okay for the father of the baby to come,

## 2015-12-31 NOTE — Telephone Encounter (Signed)
Mom informed PNV sent to pharmacy and Okay to bring baby's father to U/s.

## 2015-12-31 NOTE — Telephone Encounter (Signed)
Mom said it looks like her insurance will pay for prenatal vitamins and she wondered if you could send Rx so that she could use her insurance card for them.  Mom asked if for u/s next week that Phyllis Goodman should bring the baby's father.  She then reworded it and said if Phyllis Goodman insists on bringing him can she bring him.  She said you would understand this.

## 2016-01-03 ENCOUNTER — Telehealth: Payer: Self-pay | Admitting: *Deleted

## 2016-01-03 NOTE — Telephone Encounter (Signed)
Pt mother Eunice BlaseDebbie called stating they will be leaving for cruise, pt is pregnant asked what she could she take for nausea? Please advise

## 2016-01-03 NOTE — Telephone Encounter (Signed)
She could try OTC B6 with unisom, may make her drousy but should help with nausea. Is she nauseated now or is this just in case?

## 2016-01-03 NOTE — Telephone Encounter (Signed)
Pt mother informed with the below note, pt is not having nausea now, this was just in case for the trip

## 2016-01-04 ENCOUNTER — Telehealth: Payer: Self-pay | Admitting: *Deleted

## 2016-01-04 MED ORDER — METOCLOPRAMIDE HCL 10 MG PO TABS: ORAL_TABLET | ORAL | 0 refills | Status: DC

## 2016-01-04 NOTE — Telephone Encounter (Signed)
Pt mother informed with the below note, Rx sent.

## 2016-01-04 NOTE — Telephone Encounter (Signed)
Eunice BlaseDebbie (mother) called today stating she could not find OTC B6 with unisom at the pharmacy. Mother asked if you could prescribe a short dose of pill for nausea for pt to have while on cruise? She also asked if patient can take stool softener while pregnant, I will inform to increase fiber foods, can pt take colace as well? Please advise

## 2016-01-04 NOTE — Telephone Encounter (Signed)
Okay, Reglan 10 mg to take before meals and at bedtime #20. The B6 is separate pill and Unisom is separate that she can take them together they're both  over-the-counter medications. Colace is okay with pregnancy, increase fiber rich foods in diet.

## 2016-01-06 ENCOUNTER — Ambulatory Visit (INDEPENDENT_AMBULATORY_CARE_PROVIDER_SITE_OTHER): Payer: BLUE CROSS/BLUE SHIELD

## 2016-01-06 ENCOUNTER — Encounter: Payer: Self-pay | Admitting: Women's Health

## 2016-01-06 ENCOUNTER — Other Ambulatory Visit: Payer: Self-pay | Admitting: Women's Health

## 2016-01-06 ENCOUNTER — Ambulatory Visit (INDEPENDENT_AMBULATORY_CARE_PROVIDER_SITE_OTHER): Payer: BLUE CROSS/BLUE SHIELD | Admitting: Women's Health

## 2016-01-06 VITALS — Ht 67.0 in | Wt 154.0 lb

## 2016-01-06 DIAGNOSIS — Z3491 Encounter for supervision of normal pregnancy, unspecified, first trimester: Secondary | ICD-10-CM

## 2016-01-06 DIAGNOSIS — N912 Amenorrhea, unspecified: Secondary | ICD-10-CM | POA: Diagnosis not present

## 2016-01-06 DIAGNOSIS — O3680X0 Pregnancy with inconclusive fetal viability, not applicable or unspecified: Secondary | ICD-10-CM

## 2016-01-06 NOTE — Patient Instructions (Signed)

## 2016-01-06 NOTE — Progress Notes (Signed)
Presents for viability ultrasound.  Had been on OCs with numerous missed pills,  LMP October 4. States last heroin use October 31. Eyes other drug use, alcohol, smoking. Partner currently in rehabilitation trying to get off  HEROIN who did come for ultrasound appointment but left shortly after US.Marland Kitchen. His parents also history of alcohol and drug abuse. Denies abdominal pain, vaginal discharge, urinary symptoms, or bleeding.  Exam: Appears well. Ultrasound: T/V retroverted uterus with living IUP seen in fundus of uterus. Size less than dates by 2-1/2 weeks. CRL 6 weeks 1 day. Fetal heart motion 118 bpm. Cervix long and closed. Right ovary corpus luteal cyst 25 x 20 mm, left ovary normal. Fluid in cul-de-sac 20 x 17 mm.  First trimester pregnancy size less than dates  Plan: A copy of the ultrasound report given instructed to schedule prenatal care. Continue prenatal vitamin daily, cautioned about avoiding all drugs, alcohol. Safe pregnancy behaviors reviewed. Congratulations given.

## 2016-01-31 DIAGNOSIS — J40 Bronchitis, not specified as acute or chronic: Secondary | ICD-10-CM | POA: Diagnosis not present

## 2016-01-31 DIAGNOSIS — J060 Acute laryngopharyngitis: Secondary | ICD-10-CM | POA: Diagnosis not present

## 2016-01-31 DIAGNOSIS — Z3A09 9 weeks gestation of pregnancy: Secondary | ICD-10-CM | POA: Diagnosis not present

## 2016-01-31 DIAGNOSIS — J0141 Acute recurrent pansinusitis: Secondary | ICD-10-CM | POA: Diagnosis not present

## 2016-02-01 ENCOUNTER — Telehealth: Payer: Self-pay | Admitting: *Deleted

## 2016-02-01 NOTE — Telephone Encounter (Signed)
Eunice BlaseDebbie (mother) called she forgot the name of the office where you recommended patient to receive OB care. Please advise

## 2016-02-01 NOTE — Telephone Encounter (Signed)
TC  Questions answered

## 2016-02-14 DIAGNOSIS — R875 Abnormal microbiological findings in specimens from female genital organs: Secondary | ICD-10-CM | POA: Diagnosis not present

## 2016-02-14 DIAGNOSIS — Z3689 Encounter for other specified antenatal screening: Secondary | ICD-10-CM | POA: Diagnosis not present

## 2016-02-14 DIAGNOSIS — Z113 Encounter for screening for infections with a predominantly sexual mode of transmission: Secondary | ICD-10-CM | POA: Diagnosis not present

## 2016-02-14 DIAGNOSIS — Z361 Encounter for antenatal screening for raised alphafetoprotein level: Secondary | ICD-10-CM | POA: Diagnosis not present

## 2016-02-14 DIAGNOSIS — Z87898 Personal history of other specified conditions: Secondary | ICD-10-CM | POA: Diagnosis not present

## 2016-02-14 DIAGNOSIS — Z3483 Encounter for supervision of other normal pregnancy, third trimester: Secondary | ICD-10-CM | POA: Diagnosis not present

## 2016-02-14 DIAGNOSIS — R87610 Atypical squamous cells of undetermined significance on cytologic smear of cervix (ASC-US): Secondary | ICD-10-CM | POA: Diagnosis not present

## 2016-02-14 DIAGNOSIS — Z23 Encounter for immunization: Secondary | ICD-10-CM | POA: Diagnosis not present

## 2016-02-14 DIAGNOSIS — Z3401 Encounter for supervision of normal first pregnancy, first trimester: Secondary | ICD-10-CM | POA: Diagnosis not present

## 2016-03-15 DIAGNOSIS — Z361 Encounter for antenatal screening for raised alphafetoprotein level: Secondary | ICD-10-CM | POA: Diagnosis not present

## 2016-04-17 DIAGNOSIS — Z363 Encounter for antenatal screening for malformations: Secondary | ICD-10-CM | POA: Diagnosis not present

## 2016-04-17 DIAGNOSIS — Z3402 Encounter for supervision of normal first pregnancy, second trimester: Secondary | ICD-10-CM | POA: Diagnosis not present

## 2016-04-21 ENCOUNTER — Encounter (HOSPITAL_COMMUNITY): Payer: Self-pay

## 2016-04-21 ENCOUNTER — Emergency Department (HOSPITAL_COMMUNITY)
Admission: EM | Admit: 2016-04-21 | Discharge: 2016-04-21 | Disposition: A | Discharge status: Home / Self Care | Payer: BLUE CROSS/BLUE SHIELD | Attending: Emergency Medicine | Admitting: Emergency Medicine

## 2016-04-21 DIAGNOSIS — T148XXA Other injury of unspecified body region, initial encounter: Secondary | ICD-10-CM | POA: Diagnosis not present

## 2016-04-21 DIAGNOSIS — M25512 Pain in left shoulder: Secondary | ICD-10-CM | POA: Diagnosis not present

## 2016-04-21 DIAGNOSIS — O26892 Other specified pregnancy related conditions, second trimester: Secondary | ICD-10-CM | POA: Diagnosis not present

## 2016-04-21 DIAGNOSIS — R1030 Lower abdominal pain, unspecified: Secondary | ICD-10-CM | POA: Insufficient documentation

## 2016-04-21 DIAGNOSIS — O9A212 Injury, poisoning and certain other consequences of external causes complicating pregnancy, second trimester: Secondary | ICD-10-CM | POA: Diagnosis not present

## 2016-04-21 DIAGNOSIS — Y999 Unspecified external cause status: Secondary | ICD-10-CM | POA: Diagnosis not present

## 2016-04-21 DIAGNOSIS — Y92410 Unspecified street and highway as the place of occurrence of the external cause: Secondary | ICD-10-CM | POA: Diagnosis not present

## 2016-04-21 DIAGNOSIS — F909 Attention-deficit hyperactivity disorder, unspecified type: Secondary | ICD-10-CM | POA: Diagnosis not present

## 2016-04-21 DIAGNOSIS — Z87891 Personal history of nicotine dependence: Secondary | ICD-10-CM | POA: Diagnosis not present

## 2016-04-21 DIAGNOSIS — Y939 Activity, unspecified: Secondary | ICD-10-CM | POA: Insufficient documentation

## 2016-04-21 DIAGNOSIS — Z3A21 21 weeks gestation of pregnancy: Secondary | ICD-10-CM | POA: Diagnosis not present

## 2016-04-21 DIAGNOSIS — Z3492 Encounter for supervision of normal pregnancy, unspecified, second trimester: Secondary | ICD-10-CM

## 2016-04-21 LAB — ABO/RH: ABO/RH(D): O NEG

## 2016-04-21 LAB — KLEIHAUER-BETKE STAIN
# Vials RhIg: 1
FETAL CELLS %: 0 %
QUANTITATION FETAL HEMOGLOBIN: 0 mL

## 2016-04-21 MED ORDER — RHO D IMMUNE GLOBULIN 1500 UNIT/2ML IJ SOSY
300.0000 ug | PREFILLED_SYRINGE | Freq: Once | INTRAMUSCULAR | Status: AC
  Administered 2016-04-21: 300 ug via INTRAMUSCULAR

## 2016-04-21 NOTE — Progress Notes (Signed)
Dr Ernestina Penna updated on status. Keep appt with office on 05/15/16.  Pt reports being O- blood type.  Will administer Rhogam prior to discharge.  ED MD at bedside for evaluation.

## 2016-04-21 NOTE — ED Notes (Signed)
This nurse called OB rapid response, per Carollee Herter, [place] RN, pt with no specific OB complaint at this time, will continue to monitor pt and obtain fetal heart tones. Carollee Herter, RN to speak with Dr. Algie Coffer, pt's OBGYN.

## 2016-04-21 NOTE — ED Notes (Signed)
This nurse spoke to blood bank at [facility] to obtain an update on pt blood work. Per blood bank, pt blood specimen was received at approx 9 PM tonight and the test takes approx 2 hours to complete and receive results. This nurse was given an estimate of 10:30-11 PM before results will be finished. Will update pt and family.

## 2016-04-21 NOTE — ED Notes (Signed)
Per Dr. Judd Lien, pt able to eat. Pt and family given soda, graham crackers, and PB.

## 2016-04-21 NOTE — ED Notes (Signed)
Per blood bank, pt blood to be tested at [facility] to ensure the right dose of rho gam is administered and ordered. Explained to delay to family and offered snacks/drinks in order to make the wait more comfortable. Will continue to update the family on wait time. Dr. Judd Lien updated on pt status
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
## 2016-04-21 NOTE — ED Notes (Signed)
OB rapid response at the bedside.  

## 2016-04-21 NOTE — ED Notes (Signed)
Per Carollee Herter, OB rapid response, fetal heart tones WDL. Dr. Algie Coffer requesting rho gam administration due to pt being blood type being O neg.

## 2016-04-21 NOTE — ED Notes (Signed)
Pt and family updated regarding delay. Explained blood test takes approx 2 hours to complete and should hopefully be finished at 10:30-11PM per [facility] blood bank.
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
## 2016-04-21 NOTE — Discharge Instructions (Signed)
Follow-up with your obstetrician as scheduled, and return to the emergency department if you develop abdominal pain, vaginal bleeding, or any other new and concerning symptoms.

## 2016-04-21 NOTE — ED Notes (Signed)
ED Provider at bedside. 
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
## 2016-04-21 NOTE — ED Notes (Addendum)
Per blood bank, pt received appropriate amount of RHIG based on Kleihauer-Betke stain results. Dr. Judd Lien notified that results are back.

## 2016-04-22 LAB — RH IG WORKUP (INCLUDES ABO/RH)
ABO/RH(D): O NEG
Antibody Screen: POSITIVE
DAT, IgG: NEGATIVE
GESTATIONAL AGE(WKS): 21
UNIT DIVISION: 0

## 2016-04-25 LAB — URINE DRUGS OF ABUSE SCREEN W ALC, ROUTINE (REF LAB)
AMPHETAMINES, URINE: NEGATIVE ng/mL
Barbiturate, Ur: NEGATIVE ng/mL
Benzodiazepine Quant, Ur: NEGATIVE ng/mL
Cannabinoid Quant, Ur: NEGATIVE ng/mL
Cocaine (Metab.): NEGATIVE ng/mL
Ethanol U, Quan: NEGATIVE %
METHADONE SCREEN, URINE: NEGATIVE ng/mL
Opiate Quant, Ur: NEGATIVE ng/mL
PHENCYCLIDINE, UR: NEGATIVE ng/mL
Propoxyphene, Urine: NEGATIVE ng/mL

## 2016-05-03 DIAGNOSIS — M9902 Segmental and somatic dysfunction of thoracic region: Secondary | ICD-10-CM | POA: Diagnosis not present

## 2016-05-03 DIAGNOSIS — M9903 Segmental and somatic dysfunction of lumbar region: Secondary | ICD-10-CM | POA: Diagnosis not present

## 2016-05-03 DIAGNOSIS — M545 Low back pain: Secondary | ICD-10-CM | POA: Diagnosis not present

## 2016-05-03 DIAGNOSIS — M9905 Segmental and somatic dysfunction of pelvic region: Secondary | ICD-10-CM | POA: Diagnosis not present

## 2016-05-04 DIAGNOSIS — O359XX9 Maternal care for (suspected) fetal abnormality and damage, unspecified, other fetus: Secondary | ICD-10-CM | POA: Diagnosis not present

## 2016-05-05 DIAGNOSIS — M545 Low back pain: Secondary | ICD-10-CM | POA: Diagnosis not present

## 2016-05-05 DIAGNOSIS — M9903 Segmental and somatic dysfunction of lumbar region: Secondary | ICD-10-CM | POA: Diagnosis not present

## 2016-05-05 DIAGNOSIS — M9905 Segmental and somatic dysfunction of pelvic region: Secondary | ICD-10-CM | POA: Diagnosis not present

## 2016-05-05 DIAGNOSIS — M9902 Segmental and somatic dysfunction of thoracic region: Secondary | ICD-10-CM | POA: Diagnosis not present

## 2016-05-08 DIAGNOSIS — M9903 Segmental and somatic dysfunction of lumbar region: Secondary | ICD-10-CM | POA: Diagnosis not present

## 2016-05-08 DIAGNOSIS — M9902 Segmental and somatic dysfunction of thoracic region: Secondary | ICD-10-CM | POA: Diagnosis not present

## 2016-05-08 DIAGNOSIS — M9905 Segmental and somatic dysfunction of pelvic region: Secondary | ICD-10-CM | POA: Diagnosis not present

## 2016-05-08 DIAGNOSIS — M545 Low back pain: Secondary | ICD-10-CM | POA: Diagnosis not present

## 2016-05-12 DIAGNOSIS — H5201 Hypermetropia, right eye: Secondary | ICD-10-CM | POA: Diagnosis not present

## 2016-05-12 DIAGNOSIS — M545 Low back pain: Secondary | ICD-10-CM | POA: Diagnosis not present

## 2016-05-12 DIAGNOSIS — M9905 Segmental and somatic dysfunction of pelvic region: Secondary | ICD-10-CM | POA: Diagnosis not present

## 2016-05-12 DIAGNOSIS — M9903 Segmental and somatic dysfunction of lumbar region: Secondary | ICD-10-CM | POA: Diagnosis not present

## 2016-05-12 DIAGNOSIS — M9902 Segmental and somatic dysfunction of thoracic region: Secondary | ICD-10-CM | POA: Diagnosis not present

## 2016-05-15 DIAGNOSIS — Z3A24 24 weeks gestation of pregnancy: Secondary | ICD-10-CM | POA: Diagnosis not present

## 2016-05-15 DIAGNOSIS — O358XX0 Maternal care for other (suspected) fetal abnormality and damage, not applicable or unspecified: Secondary | ICD-10-CM | POA: Diagnosis not present

## 2016-05-17 DIAGNOSIS — M9905 Segmental and somatic dysfunction of pelvic region: Secondary | ICD-10-CM | POA: Diagnosis not present

## 2016-05-17 DIAGNOSIS — M9902 Segmental and somatic dysfunction of thoracic region: Secondary | ICD-10-CM | POA: Diagnosis not present

## 2016-05-17 DIAGNOSIS — M9903 Segmental and somatic dysfunction of lumbar region: Secondary | ICD-10-CM | POA: Diagnosis not present

## 2016-05-17 DIAGNOSIS — M545 Low back pain: Secondary | ICD-10-CM | POA: Diagnosis not present

## 2016-05-19 DIAGNOSIS — M9905 Segmental and somatic dysfunction of pelvic region: Secondary | ICD-10-CM | POA: Diagnosis not present

## 2016-05-19 DIAGNOSIS — M545 Low back pain: Secondary | ICD-10-CM | POA: Diagnosis not present

## 2016-05-19 DIAGNOSIS — M9903 Segmental and somatic dysfunction of lumbar region: Secondary | ICD-10-CM | POA: Diagnosis not present

## 2016-05-19 DIAGNOSIS — M9902 Segmental and somatic dysfunction of thoracic region: Secondary | ICD-10-CM | POA: Diagnosis not present

## 2016-05-22 DIAGNOSIS — M9903 Segmental and somatic dysfunction of lumbar region: Secondary | ICD-10-CM | POA: Diagnosis not present

## 2016-05-22 DIAGNOSIS — M9902 Segmental and somatic dysfunction of thoracic region: Secondary | ICD-10-CM | POA: Diagnosis not present

## 2016-05-22 DIAGNOSIS — M9905 Segmental and somatic dysfunction of pelvic region: Secondary | ICD-10-CM | POA: Diagnosis not present

## 2016-05-22 DIAGNOSIS — M545 Low back pain: Secondary | ICD-10-CM | POA: Diagnosis not present

## 2016-05-24 DIAGNOSIS — M545 Low back pain: Secondary | ICD-10-CM | POA: Diagnosis not present

## 2016-05-24 DIAGNOSIS — M9902 Segmental and somatic dysfunction of thoracic region: Secondary | ICD-10-CM | POA: Diagnosis not present

## 2016-05-24 DIAGNOSIS — M9905 Segmental and somatic dysfunction of pelvic region: Secondary | ICD-10-CM | POA: Diagnosis not present

## 2016-05-24 DIAGNOSIS — M9903 Segmental and somatic dysfunction of lumbar region: Secondary | ICD-10-CM | POA: Diagnosis not present

## 2016-05-31 DIAGNOSIS — M9905 Segmental and somatic dysfunction of pelvic region: Secondary | ICD-10-CM | POA: Diagnosis not present

## 2016-05-31 DIAGNOSIS — M9902 Segmental and somatic dysfunction of thoracic region: Secondary | ICD-10-CM | POA: Diagnosis not present

## 2016-05-31 DIAGNOSIS — M545 Low back pain: Secondary | ICD-10-CM | POA: Diagnosis not present

## 2016-05-31 DIAGNOSIS — M9903 Segmental and somatic dysfunction of lumbar region: Secondary | ICD-10-CM | POA: Diagnosis not present

## 2016-06-07 ENCOUNTER — Encounter: Payer: Self-pay | Admitting: Gynecology

## 2016-06-07 DIAGNOSIS — M9903 Segmental and somatic dysfunction of lumbar region: Secondary | ICD-10-CM | POA: Diagnosis not present

## 2016-06-07 DIAGNOSIS — M9902 Segmental and somatic dysfunction of thoracic region: Secondary | ICD-10-CM | POA: Diagnosis not present

## 2016-06-07 DIAGNOSIS — M9905 Segmental and somatic dysfunction of pelvic region: Secondary | ICD-10-CM | POA: Diagnosis not present

## 2016-06-07 DIAGNOSIS — M545 Low back pain: Secondary | ICD-10-CM | POA: Diagnosis not present

## 2016-06-09 DIAGNOSIS — M9905 Segmental and somatic dysfunction of pelvic region: Secondary | ICD-10-CM | POA: Diagnosis not present

## 2016-06-09 DIAGNOSIS — M9902 Segmental and somatic dysfunction of thoracic region: Secondary | ICD-10-CM | POA: Diagnosis not present

## 2016-06-09 DIAGNOSIS — M545 Low back pain: Secondary | ICD-10-CM | POA: Diagnosis not present

## 2016-06-09 DIAGNOSIS — M9903 Segmental and somatic dysfunction of lumbar region: Secondary | ICD-10-CM | POA: Diagnosis not present

## 2016-06-12 DIAGNOSIS — Z3A28 28 weeks gestation of pregnancy: Secondary | ICD-10-CM | POA: Diagnosis not present

## 2016-06-12 DIAGNOSIS — O360130 Maternal care for anti-D [Rh] antibodies, third trimester, not applicable or unspecified: Secondary | ICD-10-CM | POA: Diagnosis not present

## 2016-06-12 DIAGNOSIS — Z3689 Encounter for other specified antenatal screening: Secondary | ICD-10-CM | POA: Diagnosis not present

## 2016-06-12 DIAGNOSIS — Z23 Encounter for immunization: Secondary | ICD-10-CM | POA: Diagnosis not present

## 2016-06-12 DIAGNOSIS — O358XX0 Maternal care for other (suspected) fetal abnormality and damage, not applicable or unspecified: Secondary | ICD-10-CM | POA: Diagnosis not present

## 2016-06-12 DIAGNOSIS — Z87898 Personal history of other specified conditions: Secondary | ICD-10-CM | POA: Diagnosis not present

## 2016-06-14 DIAGNOSIS — M9905 Segmental and somatic dysfunction of pelvic region: Secondary | ICD-10-CM | POA: Diagnosis not present

## 2016-06-14 DIAGNOSIS — M9903 Segmental and somatic dysfunction of lumbar region: Secondary | ICD-10-CM | POA: Diagnosis not present

## 2016-06-14 DIAGNOSIS — M9902 Segmental and somatic dysfunction of thoracic region: Secondary | ICD-10-CM | POA: Diagnosis not present

## 2016-06-14 DIAGNOSIS — M545 Low back pain: Secondary | ICD-10-CM | POA: Diagnosis not present

## 2016-06-16 DIAGNOSIS — M9902 Segmental and somatic dysfunction of thoracic region: Secondary | ICD-10-CM | POA: Diagnosis not present

## 2016-06-16 DIAGNOSIS — M9905 Segmental and somatic dysfunction of pelvic region: Secondary | ICD-10-CM | POA: Diagnosis not present

## 2016-06-16 DIAGNOSIS — M9903 Segmental and somatic dysfunction of lumbar region: Secondary | ICD-10-CM | POA: Diagnosis not present

## 2016-06-16 DIAGNOSIS — M545 Low back pain: Secondary | ICD-10-CM | POA: Diagnosis not present

## 2016-06-28 DIAGNOSIS — O3663X0 Maternal care for excessive fetal growth, third trimester, not applicable or unspecified: Secondary | ICD-10-CM | POA: Diagnosis not present

## 2016-06-28 DIAGNOSIS — Z3A31 31 weeks gestation of pregnancy: Secondary | ICD-10-CM | POA: Diagnosis not present

## 2016-07-03 DIAGNOSIS — Q249 Congenital malformation of heart, unspecified: Secondary | ICD-10-CM | POA: Diagnosis not present

## 2016-07-04 ENCOUNTER — Other Ambulatory Visit (HOSPITAL_COMMUNITY): Payer: Self-pay | Admitting: Obstetrics

## 2016-07-04 DIAGNOSIS — Z3689 Encounter for other specified antenatal screening: Secondary | ICD-10-CM

## 2016-07-14 ENCOUNTER — Other Ambulatory Visit (HOSPITAL_COMMUNITY): Payer: Self-pay | Admitting: Obstetrics

## 2016-07-14 ENCOUNTER — Ambulatory Visit (HOSPITAL_COMMUNITY)
Admission: RE | Admit: 2016-07-14 | Discharge: 2016-07-14 | Disposition: A | Discharge status: Home / Self Care | Payer: BLUE CROSS/BLUE SHIELD | Source: Ambulatory Visit | Attending: Obstetrics | Admitting: Obstetrics

## 2016-07-14 ENCOUNTER — Encounter (HOSPITAL_COMMUNITY): Payer: Self-pay

## 2016-07-14 ENCOUNTER — Ambulatory Visit (HOSPITAL_COMMUNITY): Admission: RE | Admit: 2016-07-14 | Payer: BLUE CROSS/BLUE SHIELD | Source: Ambulatory Visit

## 2016-07-14 DIAGNOSIS — O359XX0 Maternal care for (suspected) fetal abnormality and damage, unspecified, not applicable or unspecified: Secondary | ICD-10-CM

## 2016-07-14 DIAGNOSIS — Z3A33 33 weeks gestation of pregnancy: Secondary | ICD-10-CM | POA: Diagnosis not present

## 2016-07-14 DIAGNOSIS — O321XX0 Maternal care for breech presentation, not applicable or unspecified: Secondary | ICD-10-CM | POA: Insufficient documentation

## 2016-07-14 DIAGNOSIS — O358XX0 Maternal care for other (suspected) fetal abnormality and damage, not applicable or unspecified: Secondary | ICD-10-CM | POA: Insufficient documentation

## 2016-07-14 DIAGNOSIS — Z3683 Encounter for fetal screening for congenital cardiac abnormalities: Secondary | ICD-10-CM

## 2016-07-14 DIAGNOSIS — Z3689 Encounter for other specified antenatal screening: Secondary | ICD-10-CM

## 2016-07-14 DIAGNOSIS — O99323 Drug use complicating pregnancy, third trimester: Secondary | ICD-10-CM

## 2016-07-19 DIAGNOSIS — O358XX0 Maternal care for other (suspected) fetal abnormality and damage, not applicable or unspecified: Secondary | ICD-10-CM | POA: Diagnosis not present

## 2016-07-19 DIAGNOSIS — Z3A34 34 weeks gestation of pregnancy: Secondary | ICD-10-CM | POA: Diagnosis not present

## 2016-07-19 DIAGNOSIS — O3663X0 Maternal care for excessive fetal growth, third trimester, not applicable or unspecified: Secondary | ICD-10-CM | POA: Diagnosis not present

## 2016-07-28 DIAGNOSIS — Q203 Discordant ventriculoarterial connection: Secondary | ICD-10-CM | POA: Diagnosis not present

## 2016-07-28 DIAGNOSIS — Z363 Encounter for antenatal screening for malformations: Secondary | ICD-10-CM | POA: Diagnosis not present

## 2016-07-28 DIAGNOSIS — Z3A35 35 weeks gestation of pregnancy: Secondary | ICD-10-CM | POA: Diagnosis not present

## 2016-07-28 DIAGNOSIS — O358XX0 Maternal care for other (suspected) fetal abnormality and damage, not applicable or unspecified: Secondary | ICD-10-CM | POA: Diagnosis not present

## 2016-07-28 DIAGNOSIS — O321XX0 Maternal care for breech presentation, not applicable or unspecified: Secondary | ICD-10-CM | POA: Diagnosis not present

## 2016-08-03 DIAGNOSIS — O358XX0 Maternal care for other (suspected) fetal abnormality and damage, not applicable or unspecified: Secondary | ICD-10-CM | POA: Diagnosis not present

## 2016-08-03 DIAGNOSIS — Z3A36 36 weeks gestation of pregnancy: Secondary | ICD-10-CM | POA: Diagnosis not present

## 2016-08-03 DIAGNOSIS — Z3685 Encounter for antenatal screening for Streptococcus B: Secondary | ICD-10-CM | POA: Diagnosis not present

## 2016-08-07 DIAGNOSIS — O358XX0 Maternal care for other (suspected) fetal abnormality and damage, not applicable or unspecified: Secondary | ICD-10-CM | POA: Diagnosis not present

## 2016-08-07 DIAGNOSIS — Z87898 Personal history of other specified conditions: Secondary | ICD-10-CM | POA: Diagnosis not present

## 2016-08-07 DIAGNOSIS — O09899 Supervision of other high risk pregnancies, unspecified trimester: Secondary | ICD-10-CM | POA: Diagnosis not present

## 2016-08-07 DIAGNOSIS — O0991 Supervision of high risk pregnancy, unspecified, first trimester: Secondary | ICD-10-CM | POA: Diagnosis not present

## 2016-08-09 ENCOUNTER — Encounter (HOSPITAL_COMMUNITY): Payer: Self-pay

## 2016-08-09 ENCOUNTER — Other Ambulatory Visit (HOSPITAL_COMMUNITY): Payer: Self-pay

## 2016-08-09 DIAGNOSIS — B078 Other viral warts: Secondary | ICD-10-CM | POA: Diagnosis not present

## 2016-08-10 DIAGNOSIS — O358XX0 Maternal care for other (suspected) fetal abnormality and damage, not applicable or unspecified: Secondary | ICD-10-CM | POA: Diagnosis not present

## 2016-08-10 DIAGNOSIS — Z3A37 37 weeks gestation of pregnancy: Secondary | ICD-10-CM | POA: Diagnosis not present

## 2016-08-18 DIAGNOSIS — Z3A38 38 weeks gestation of pregnancy: Secondary | ICD-10-CM | POA: Diagnosis not present

## 2016-08-18 DIAGNOSIS — O358XX0 Maternal care for other (suspected) fetal abnormality and damage, not applicable or unspecified: Secondary | ICD-10-CM | POA: Diagnosis not present

## 2016-08-25 DIAGNOSIS — O358XX0 Maternal care for other (suspected) fetal abnormality and damage, not applicable or unspecified: Secondary | ICD-10-CM | POA: Diagnosis not present

## 2016-08-25 DIAGNOSIS — Z3A39 39 weeks gestation of pregnancy: Secondary | ICD-10-CM | POA: Diagnosis not present

## 2016-08-28 DIAGNOSIS — O99343 Other mental disorders complicating pregnancy, third trimester: Secondary | ICD-10-CM | POA: Diagnosis not present

## 2016-08-28 DIAGNOSIS — Z87891 Personal history of nicotine dependence: Secondary | ICD-10-CM | POA: Diagnosis not present

## 2016-08-28 DIAGNOSIS — F900 Attention-deficit hyperactivity disorder, predominantly inattentive type: Secondary | ICD-10-CM | POA: Diagnosis not present

## 2016-08-28 DIAGNOSIS — Z87898 Personal history of other specified conditions: Secondary | ICD-10-CM | POA: Diagnosis not present

## 2016-08-28 DIAGNOSIS — O26893 Other specified pregnancy related conditions, third trimester: Secondary | ICD-10-CM | POA: Diagnosis not present

## 2016-08-28 DIAGNOSIS — R768 Other specified abnormal immunological findings in serum: Secondary | ICD-10-CM | POA: Diagnosis not present

## 2016-08-28 DIAGNOSIS — O321XX0 Maternal care for breech presentation, not applicable or unspecified: Secondary | ICD-10-CM | POA: Diagnosis not present

## 2016-08-28 DIAGNOSIS — Z6791 Unspecified blood type, Rh negative: Secondary | ICD-10-CM | POA: Diagnosis not present

## 2016-08-28 DIAGNOSIS — O358XX0 Maternal care for other (suspected) fetal abnormality and damage, not applicable or unspecified: Secondary | ICD-10-CM | POA: Diagnosis not present

## 2016-08-28 DIAGNOSIS — Z01818 Encounter for other preprocedural examination: Secondary | ICD-10-CM | POA: Diagnosis not present

## 2016-08-28 DIAGNOSIS — Z3A39 39 weeks gestation of pregnancy: Secondary | ICD-10-CM | POA: Diagnosis not present

## 2016-08-30 DIAGNOSIS — Z87891 Personal history of nicotine dependence: Secondary | ICD-10-CM | POA: Diagnosis not present

## 2016-08-30 DIAGNOSIS — Z3A40 40 weeks gestation of pregnancy: Secondary | ICD-10-CM | POA: Diagnosis not present

## 2016-08-30 DIAGNOSIS — Z9889 Other specified postprocedural states: Secondary | ICD-10-CM | POA: Diagnosis not present

## 2016-08-30 DIAGNOSIS — Z30017 Encounter for initial prescription of implantable subdermal contraceptive: Secondary | ICD-10-CM | POA: Diagnosis not present

## 2016-08-30 DIAGNOSIS — O99343 Other mental disorders complicating pregnancy, third trimester: Secondary | ICD-10-CM | POA: Diagnosis not present

## 2016-08-30 DIAGNOSIS — O26893 Other specified pregnancy related conditions, third trimester: Secondary | ICD-10-CM | POA: Diagnosis not present

## 2016-08-30 DIAGNOSIS — Z01818 Encounter for other preprocedural examination: Secondary | ICD-10-CM | POA: Diagnosis not present

## 2016-08-30 DIAGNOSIS — F909 Attention-deficit hyperactivity disorder, unspecified type: Secondary | ICD-10-CM | POA: Diagnosis not present

## 2016-08-30 DIAGNOSIS — O321XX0 Maternal care for breech presentation, not applicable or unspecified: Secondary | ICD-10-CM | POA: Diagnosis not present

## 2016-08-30 DIAGNOSIS — F900 Attention-deficit hyperactivity disorder, predominantly inattentive type: Secondary | ICD-10-CM | POA: Diagnosis not present

## 2016-08-30 DIAGNOSIS — O99344 Other mental disorders complicating childbirth: Secondary | ICD-10-CM | POA: Diagnosis not present

## 2016-08-30 DIAGNOSIS — Z87898 Personal history of other specified conditions: Secondary | ICD-10-CM | POA: Diagnosis not present

## 2016-08-30 DIAGNOSIS — Z3A39 39 weeks gestation of pregnancy: Secondary | ICD-10-CM | POA: Diagnosis not present

## 2016-08-30 DIAGNOSIS — Z6791 Unspecified blood type, Rh negative: Secondary | ICD-10-CM | POA: Diagnosis not present

## 2016-08-30 DIAGNOSIS — O358XX0 Maternal care for other (suspected) fetal abnormality and damage, not applicable or unspecified: Secondary | ICD-10-CM | POA: Diagnosis not present

## 2016-08-31 DIAGNOSIS — Z9889 Other specified postprocedural states: Secondary | ICD-10-CM | POA: Diagnosis not present

## 2016-09-01 DIAGNOSIS — Z9889 Other specified postprocedural states: Secondary | ICD-10-CM | POA: Diagnosis not present

## 2016-09-02 DIAGNOSIS — Z9889 Other specified postprocedural states: Secondary | ICD-10-CM | POA: Diagnosis not present

## 2016-09-02 DIAGNOSIS — Z30017 Encounter for initial prescription of implantable subdermal contraceptive: Secondary | ICD-10-CM | POA: Diagnosis not present

## 2016-09-03 DIAGNOSIS — Z87898 Personal history of other specified conditions: Secondary | ICD-10-CM | POA: Diagnosis not present

## 2016-09-03 DIAGNOSIS — F909 Attention-deficit hyperactivity disorder, unspecified type: Secondary | ICD-10-CM | POA: Diagnosis not present

## 2016-09-03 DIAGNOSIS — Z9889 Other specified postprocedural states: Secondary | ICD-10-CM | POA: Diagnosis not present

## 2016-09-19 DIAGNOSIS — Z833 Family history of diabetes mellitus: Secondary | ICD-10-CM | POA: Diagnosis not present

## 2016-09-19 DIAGNOSIS — Z4889 Encounter for other specified surgical aftercare: Secondary | ICD-10-CM | POA: Diagnosis not present

## 2016-09-19 DIAGNOSIS — Z87891 Personal history of nicotine dependence: Secondary | ICD-10-CM | POA: Diagnosis not present

## 2016-09-19 DIAGNOSIS — Z8659 Personal history of other mental and behavioral disorders: Secondary | ICD-10-CM | POA: Diagnosis not present

## 2016-09-19 DIAGNOSIS — O9089 Other complications of the puerperium, not elsewhere classified: Secondary | ICD-10-CM | POA: Diagnosis not present

## 2016-09-19 DIAGNOSIS — Z87898 Personal history of other specified conditions: Secondary | ICD-10-CM | POA: Diagnosis not present

## 2016-09-19 DIAGNOSIS — Z8759 Personal history of other complications of pregnancy, childbirth and the puerperium: Secondary | ICD-10-CM | POA: Diagnosis not present

## 2016-09-20 DIAGNOSIS — Z09 Encounter for follow-up examination after completed treatment for conditions other than malignant neoplasm: Secondary | ICD-10-CM | POA: Diagnosis not present

## 2016-10-13 DIAGNOSIS — Z09 Encounter for follow-up examination after completed treatment for conditions other than malignant neoplasm: Secondary | ICD-10-CM | POA: Diagnosis not present

## 2016-11-27 ENCOUNTER — Encounter (HOSPITAL_COMMUNITY): Payer: Self-pay

## 2016-11-29 DIAGNOSIS — N921 Excessive and frequent menstruation with irregular cycle: Secondary | ICD-10-CM | POA: Diagnosis not present

## 2016-11-29 DIAGNOSIS — Z118 Encounter for screening for other infectious and parasitic diseases: Secondary | ICD-10-CM | POA: Diagnosis not present

## 2016-11-29 DIAGNOSIS — N760 Acute vaginitis: Secondary | ICD-10-CM | POA: Diagnosis not present

## 2016-11-29 DIAGNOSIS — Z3200 Encounter for pregnancy test, result unknown: Secondary | ICD-10-CM | POA: Diagnosis not present

## 2017-02-09 DIAGNOSIS — Z3200 Encounter for pregnancy test, result unknown: Secondary | ICD-10-CM | POA: Diagnosis not present

## 2017-02-09 DIAGNOSIS — N939 Abnormal uterine and vaginal bleeding, unspecified: Secondary | ICD-10-CM | POA: Diagnosis not present

## 2017-02-09 DIAGNOSIS — R5383 Other fatigue: Secondary | ICD-10-CM | POA: Diagnosis not present

## 2017-04-23 DIAGNOSIS — H5201 Hypermetropia, right eye: Secondary | ICD-10-CM | POA: Diagnosis not present

## 2017-06-25 DIAGNOSIS — N921 Excessive and frequent menstruation with irregular cycle: Secondary | ICD-10-CM | POA: Diagnosis not present

## 2017-06-25 DIAGNOSIS — Z118 Encounter for screening for other infectious and parasitic diseases: Secondary | ICD-10-CM | POA: Diagnosis not present

## 2017-06-25 DIAGNOSIS — B078 Other viral warts: Secondary | ICD-10-CM | POA: Diagnosis not present

## 2017-06-25 DIAGNOSIS — Z309 Encounter for contraceptive management, unspecified: Secondary | ICD-10-CM | POA: Diagnosis not present

## 2017-06-25 DIAGNOSIS — N760 Acute vaginitis: Secondary | ICD-10-CM | POA: Diagnosis not present

## 2017-07-20 DIAGNOSIS — B078 Other viral warts: Secondary | ICD-10-CM | POA: Diagnosis not present

## 2017-07-31 DIAGNOSIS — Z3046 Encounter for surveillance of implantable subdermal contraceptive: Secondary | ICD-10-CM | POA: Diagnosis not present

## 2017-07-31 DIAGNOSIS — Z309 Encounter for contraceptive management, unspecified: Secondary | ICD-10-CM | POA: Diagnosis not present

## 2018-08-10 DIAGNOSIS — Z20828 Contact with and (suspected) exposure to other viral communicable diseases: Secondary | ICD-10-CM | POA: Diagnosis not present

## 2018-12-31 ENCOUNTER — Other Ambulatory Visit: Payer: Self-pay

## 2018-12-31 ENCOUNTER — Ambulatory Visit: Payer: Medicaid Other | Attending: Nurse Practitioner | Admitting: Nurse Practitioner

## 2019-01-24 NOTE — L&D Delivery Note (Addendum)
OB/GYN Faculty Practice Delivery Note  Phyllis Goodman is a 24 y.o. [id number] s/p SVD at [redacted]w[redacted]d. She was admitted for spontaneous onset of labor.   ROM: 3h 80m with clear fluid GBS Status: GBS+ on PCN Maximum Maternal Temperature: 99.43F  Labor Progress: . Patient presented with spontaneous onset of labor, early labor, desired TOLAC. Augmentation with foley bulb and Pitocin.   Delivery Date/Time: 09/20/19 at 2:35AM Delivery: Called to room and patient was complete and pushing. Head delivered LOA. No nuchal cord present. Shoulder and body delivered in usual fashion. Infant with spontaneous cry, placed on mother's abdomen, dried and stimulated. Cord clamped x 2 after 1-minute delay, and cut by mother of patient under my direct supervision. Cord blood drawn. Placenta delivered spontaneously with gentle cord traction. Fundus firm with massage and Pitocin. Labia, perineum, vagina, and cervix were inspected. Right and left sulcus lacerations that required repair with 2.0 and 3.0 vicryl x4. Delivery complicated by PPH requiring rectal cytotec, methergine, hemabate and TXA.   Placenta: Intact, 3-vessel cord Complications: PPH from lacerations requiring rectal cytotec, Methergine, Hemabate and TXA. Lacerations: Right and left sulcus repaired with 2.0 and 3.0 vicryl x4. Right and left labial hemostatic EBL: 1079cc Analgesia: Epidural  Infant: boy  APGARs 9,9  weight pending  Sabino Dick PGY-1 Family Medicine   GME ATTESTATION:  I saw and evaluated the patient. I agree with the findings and the plan of care as documented in the resident's note.  Alric Seton, MD OB Fellow, Faculty [facility], Center for [facility] 09/20/2019 6:33 AM

## 2019-04-15 ENCOUNTER — Ambulatory Visit: Payer: Medicaid Other | Admitting: Nurse Practitioner

## 2019-04-17 ENCOUNTER — Encounter: Payer: Self-pay | Admitting: Family Medicine

## 2019-04-17 ENCOUNTER — Other Ambulatory Visit: Payer: Self-pay

## 2019-04-17 ENCOUNTER — Ambulatory Visit: Payer: Medicaid Other | Attending: Family Medicine | Admitting: Family Medicine

## 2019-04-17 VITALS — BP 95/61 | HR 80 | Temp 97.8°F | Ht 67.0 in | Wt 183.4 lb

## 2019-04-17 DIAGNOSIS — Z3201 Encounter for pregnancy test, result positive: Secondary | ICD-10-CM

## 2019-04-17 DIAGNOSIS — N912 Amenorrhea, unspecified: Secondary | ICD-10-CM

## 2019-04-17 LAB — POCT URINE PREGNANCY: Preg Test, Ur: POSITIVE — AB

## 2019-04-17 MED ORDER — PRENATAL VITAMIN 27-0.8 MG PO TABS: 1.0000 | ORAL_TABLET | Freq: Every day | ORAL | 4 refills | Status: DC

## 2019-04-17 NOTE — Patient Instructions (Signed)
Eating Plan for Pregnant Women While you are pregnant, your body requires additional nutrition to help support your growing baby. You also have a higher need for some vitamins and minerals, such as folic acid, calcium, iron, and vitamin D. Eating a healthy, well-balanced diet is very important for your health and your baby's health. Your need for extra calories varies for the three 3-month segments of your pregnancy (trimesters). For most women, it is recommended to consume:  150 extra calories a day during the first trimester.  300 extra calories a day during the second trimester.  300 extra calories a day during the third trimester. What are tips for following this plan?   Do not try to lose weight or go on a diet during pregnancy.  Limit your overall intake of foods that have "empty calories." These are foods that have little nutritional value, such as sweets, desserts, candies, and sugar-sweetened beverages.  Eat a variety of foods (especially fruits and vegetables) to get a full range of vitamins and minerals.  Take a prenatal vitamin to help meet your additional vitamin and mineral needs during pregnancy, specifically for folic acid, iron, calcium, and vitamin D.  Remember to stay active. Ask your health care provider what types of exercise and activities are safe for you.  Practice good food safety and cleanliness. Wash your hands before you eat and after you prepare raw meat. Wash all fruits and vegetables well before peeling or eating. Taking these actions can help to prevent food-borne illnesses that can be very dangerous to your baby, such as listeriosis. Ask your health care provider for more information about listeriosis. What does 150 extra calories look like? Healthy options that provide 150 extra calories each day could be any of the following:  6-8 oz (170-230 g) of plain low-fat yogurt with  cup of berries.  1 apple with 2 teaspoons (11 g) of peanut butter.  Cut-up  vegetables with  cup (60 g) of hummus.  8 oz (230 mL) or 1 cup of low-fat chocolate milk.  1 stick of string cheese with 1 medium orange.  1 peanut butter and jelly sandwich that is made with one slice of whole-wheat bread and 1 tsp (5 g) of peanut butter. For 300 extra calories, you could eat two of those healthy options each day. What is a healthy amount of weight to gain? The right amount of weight gain for you is based on your BMI before you became pregnant. If your BMI:  Was less than 18 (underweight), you should gain 28-40 lb (13-18 kg).  Was 18-24.9 (normal), you should gain 25-35 lb (11-16 kg).  Was 25-29.9 (overweight), you should gain 15-25 lb (7-11 kg).  Was 30 or greater (obese), you should gain 11-20 lb (5-9 kg). What if I am having twins or multiples? Generally, if you are carrying twins or multiples:  You may need to eat 300-600 extra calories a day.  The recommended range for total weight gain is 25-54 lb (11-25 kg), depending on your BMI before pregnancy.  Talk with your health care provider to find out about nutritional needs, weight gain, and exercise that is right for you. What foods can I eat?  Fruits All fruits. Eat a variety of colors and types of fruit. Remember to wash your fruits well before peeling or eating. Vegetables All vegetables. Eat a variety of colors and types of vegetables. Remember to wash your vegetables well before peeling or eating. Grains All grains. Choose whole grains, such   as whole-wheat bread, oatmeal, or brown rice. Meats and other protein foods Lean meats, including chicken, turkey, fish, and lean cuts of beef, veal, or pork. If you eat fish or seafood, choose options that are higher in omega-3 fatty acids and lower in mercury, such as salmon, herring, mussels, trout, sardines, pollock, shrimp, crab, and lobster. Tofu. Tempeh. Beans. Eggs. Peanut butter and other nut butters. Make sure that all meats, poultry, and eggs are cooked to  food-safe temperatures or "well-done." Two or more servings of fish are recommended each week in order to get the most benefits from omega-3 fatty acids that are found in seafood. Choose fish that are lower in mercury. You can find more information online:  www.fda.gov Dairy Pasteurized milk and milk alternatives (such as almond milk). Pasteurized yogurt and pasteurized cheese. Cottage cheese. Sour cream. Beverages Water. Juices that contain 100% fruit juice or vegetable juice. Caffeine-free teas and decaffeinated coffee. Drinks that contain caffeine are okay to drink, but it is better to avoid caffeine. Keep your total caffeine intake to less than 200 mg each day (which is 12 oz or 355 mL of coffee, tea, or soda) or the limit as told by your health care provider. Fats and oils Fats and oils are okay to include in moderation. Sweets and desserts Sweets and desserts are okay to include in moderation. Seasoning and other foods All pasteurized condiments. The items listed above may not be a complete list of foods and beverages you can eat. Contact a dietitian for more information. What foods are not recommended? Fruits Unpasteurized fruit juices. Vegetables Raw (unpasteurized) vegetable juices. Meats and other protein foods Lunch meats, bologna, hot dogs, or other deli meats. (If you must eat those meats, reheat them until they are steaming hot.) Refrigerated pat, meat spreads from a meat counter, smoked seafood that is found in the refrigerated section of a store. Raw or undercooked meats, poultry, and eggs. Raw fish, such as sushi or sashimi. Fish that have high mercury content, such as tilefish, shark, swordfish, and king mackerel. To learn more about mercury in fish, talk with your health care provider or look for online resources, such as:  www.fda.gov Dairy Raw (unpasteurized) milk and any foods that have raw milk in them. Soft cheeses, such as feta, queso blanco, queso fresco, Brie,  Camembert cheeses, blue-veined cheeses, and Panela cheese (unless it is made with pasteurized milk, which must be stated on the label). Beverages Alcohol. Sugar-sweetened beverages, such as sodas, teas, or energy drinks. Seasoning and other foods Homemade fermented foods and drinks, such as pickles, sauerkraut, or kombucha drinks. (Store-bought pasteurized versions of these are okay.) Salads that are made in a store or deli, such as ham salad, chicken salad, egg salad, tuna salad, and seafood salad. The items listed above may not be a complete list of foods and beverages you should avoid. Contact a dietitian for more information. Where to find more information To calculate the number of calories you need based on your height, weight, and activity level, you can use an online calculator such as:  www.choosemyplate.gov/MyPlatePlan To calculate how much weight you should gain during pregnancy, you can use an online pregnancy weight gain calculator such as:  www.choosemyplate.gov/pregnancy-weight-gain-calculator Summary  While you are pregnant, your body requires additional nutrition to help support your growing baby.  Eat a variety of foods, especially fruits and vegetables to get a full range of vitamins and minerals.  Practice good food safety and cleanliness. Wash your hands before you eat   and after you prepare raw meat. Wash all fruits and vegetables well before peeling or eating. Taking these actions can help to prevent food-borne illnesses, such as listeriosis, that can be very dangerous to your baby.  Do not eat raw meat or fish. Do not eat fish that have high mercury content, such as tilefish, shark, swordfish, and king mackerel. Do not eat unpasteurized (raw) dairy.  Take a prenatal vitamin to help meet your additional vitamin and mineral needs during pregnancy, specifically for folic acid, iron, calcium, and vitamin D. This information is not intended to replace advice given to you by  your health care provider. Make sure you discuss any questions you have with your health care provider. Document Revised: 05/30/2018 Document Reviewed: 10/06/2016 Elsevier Patient Education  2020 Elsevier Inc.  

## 2019-04-17 NOTE — Progress Notes (Signed)
Subjective:  Patient ID: Rogers Seeds, female    DOB: 07-08-1995  Age: 24 y.o. MRN: [id number]  CC: No chief complaint on file.   HPI NYANNA HEIDEMAN, 24 year old female new to the practice who presents secondary to complaint of absent menses since December of this year.  She reports that she recently had onset of breast tenderness and felt as if her breasts were larger.  She has been pregnant in the past.  She has not taken a home pregnancy test.  She would like to have pregnancy test at today's visit and OB/GYN referral if test is positive.  On questioning, she has had some fatigue, urinary frequency but no dysuria.  She denies any abdominal pain.  She has had no spotting since December when she had her last menses.  Past Medical History:  Diagnosis Date  . ADHD (attention deficit hyperactivity disorder)   . Allergy     Past Surgical History:  Procedure Laterality Date  . NO PAST SURGERIES      Family History  Problem Relation Age of Onset  . Seizures Father     Social History   Tobacco Use  . Smoking status: Former Smoker    Packs/day: 0.10    Types: Cigarettes    Quit date: 12/2015    Years since quitting: 3.3  . Smokeless tobacco: Never Used  Substance Use Topics  . Alcohol use: Yes    Comment: SOCIAL     ROS Review of Systems  Constitutional: Positive for fatigue. Negative for chills and fever.  HENT: Negative for sore throat and trouble swallowing.   Eyes: Negative for photophobia and visual disturbance.  Respiratory: Negative for cough and shortness of breath (Occasional shortness of breath with stair climbing).   Cardiovascular: Negative for chest pain, palpitations and leg swelling.  Gastrointestinal: Negative for abdominal pain, blood in stool, constipation, diarrhea and nausea.  Endocrine: Negative for polydipsia, polyphagia and polyuria.  Genitourinary: Positive for frequency (Usually at night) and menstrual problem. Negative for dysuria, vaginal  bleeding, vaginal discharge and vaginal pain.  Musculoskeletal: Negative for arthralgias and back pain.  Neurological: Negative for dizziness and headaches.  Hematological: Negative for adenopathy. Does not bruise/bleed easily.    Objective:   Today's Vitals: BP 95/61   Pulse 80   Temp 97.8 F (36.6 C) (Oral)   Ht 5\' 7"  (1.702 m)   Wt 183 lb 6.4 oz (83.2 kg)   LMP 12/26/2018 (Approximate)   SpO2 98%   Breastfeeding No   BMI 28.72 kg/m   Physical Exam Vitals and nursing note reviewed.  Constitutional:      Appearance: Normal appearance.  Cardiovascular:     Rate and Rhythm: Normal rate and regular rhythm.  Pulmonary:     Effort: Pulmonary effort is normal.     Breath sounds: Normal breath sounds.  Abdominal:     Palpations: Abdomen is soft.     Tenderness: There is no abdominal tenderness. There is no right CVA tenderness, left CVA tenderness, guarding or rebound.  Musculoskeletal:        General: No tenderness.     Right lower leg: No edema.     Left lower leg: No edema.  Skin:    General: Skin is warm and dry.  Neurological:     General: No focal deficit present.     Mental Status: She is alert and oriented to person, place, and time.  Psychiatric:        Mood and Affect:  Mood normal.        Behavior: Behavior normal.     Assessment & Plan:   1. Absent menses; 2.  Positive pregnancy test Patient reports no menses since December and she was asked to give urine sample for pregnancy test which was positive at today's visit.  She was provided with refill of prenatal multivitamins and will be referred to OB/GYN for further evaluation and treatment/management of pregnancy - POCT urine pregnancy -Ambulatory referral OB/GYN -Prenatal vitamin tabs #90, refill #3  Outpatient Encounter Medications as of 04/17/2019  Medication Sig  . lisdexamfetamine (VYVANSE) 50 MG capsule Take 1 capsule (50 mg total) by mouth daily. Take 2 tablets by mouth daily (Patient not taking:  Reported on 04/21/2016)  . metoCLOPramide (REGLAN) 10 MG tablet Take one tablet before meals and at bedtimes for nausea (Patient not taking: Reported on 04/21/2016)  . Prenatal Vit-Fe Fumarate-FA (PRENATAL VITAMIN) 27-0.8 MG TABS Take 1 tablet by mouth daily. (Patient taking differently: Take 1 tablet by mouth at bedtime. )   No facility-administered encounter medications on file as of 04/17/2019.    An After Visit Summary was printed and given to the patient.   Follow-up: Return for Routine health care as needed.   Antony Blackbird MD

## 2019-04-17 NOTE — Progress Notes (Signed)
New Patient  Patient have not had her period since December and needs referral to OBGYN or a GYN doctor

## 2019-04-21 ENCOUNTER — Ambulatory Visit: Payer: Medicaid Other | Admitting: Nurse Practitioner

## 2019-05-05 ENCOUNTER — Encounter: Payer: Medicaid Other | Admitting: Obstetrics

## 2019-05-14 ENCOUNTER — Other Ambulatory Visit: Payer: Self-pay

## 2019-05-14 ENCOUNTER — Ambulatory Visit (INDEPENDENT_AMBULATORY_CARE_PROVIDER_SITE_OTHER): Payer: Medicaid Other | Admitting: Obstetrics

## 2019-05-14 ENCOUNTER — Other Ambulatory Visit (HOSPITAL_COMMUNITY)
Admission: RE | Admit: 2019-05-14 | Discharge: 2019-05-14 | Disposition: A | Discharge status: Home / Self Care | Payer: Medicaid Other | Source: Ambulatory Visit | Attending: Obstetrics | Admitting: Obstetrics

## 2019-05-14 ENCOUNTER — Encounter: Payer: Self-pay | Admitting: Obstetrics

## 2019-05-14 VITALS — BP 98/63 | HR 76 | Wt 192.2 lb

## 2019-05-14 DIAGNOSIS — Z3482 Encounter for supervision of other normal pregnancy, second trimester: Secondary | ICD-10-CM | POA: Diagnosis not present

## 2019-05-14 DIAGNOSIS — Z3400 Encounter for supervision of normal first pregnancy, unspecified trimester: Secondary | ICD-10-CM | POA: Diagnosis not present

## 2019-05-14 DIAGNOSIS — Z3480 Encounter for supervision of other normal pregnancy, unspecified trimester: Secondary | ICD-10-CM | POA: Diagnosis not present

## 2019-05-14 DIAGNOSIS — Z3A19 19 weeks gestation of pregnancy: Secondary | ICD-10-CM | POA: Diagnosis not present

## 2019-05-14 DIAGNOSIS — O34219 Maternal care for unspecified type scar from previous cesarean delivery: Secondary | ICD-10-CM

## 2019-05-14 MED ORDER — PRENATE MINI 29-0.6-0.4-350 MG PO CAPS: 1.0000 | ORAL_CAPSULE | Freq: Every day | ORAL | 3 refills | Status: DC

## 2019-05-14 MED ORDER — BLOOD PRESSURE KIT DEVI: 1.0000 | 0 refills | Status: DC | PRN

## 2019-05-14 NOTE — Progress Notes (Signed)
Subjective:    Phyllis Goodman is being seen today for her first obstetrical visit.  This is not a planned pregnancy. She is at 75w6dgestation. Her obstetrical history is significant for none. Relationship with FOB: significant other, not living together. Patient does intend to breast feed. Pregnancy history fully reviewed.  The information documented in the HPI was reviewed and verified.  Menstrual History: OB History    Gravida  2   Para  1   Term  1   Preterm  0   AB  0   Living  1     SAB  0   TAB  0   Ectopic  0   Multiple  0   Live Births  1            Patient's last menstrual period was 12/26/2018.    Past Medical History:  Diagnosis Date  . ADHD (attention deficit hyperactivity disorder)   . Allergy     Past Surgical History:  Procedure Laterality Date  . NO PAST SURGERIES      (Not in a hospital admission)  No Known Allergies  Social History   Tobacco Use  . Smoking status: Former Smoker    Packs/day: 0.10    Types: Cigarettes    Quit date: 12/2015    Years since quitting: 3.3  . Smokeless tobacco: Never Used  Substance Use Topics  . Alcohol use: Not Currently    Comment: SOCIAL     Family History  Problem Relation Age of Onset  . Seizures Father      Review of Systems Constitutional: negative for weight loss Gastrointestinal: negative for vomiting Genitourinary:negative for genital lesions and vaginal discharge and dysuria Musculoskeletal:negative for back pain Behavioral/Psych: negative for abusive relationship, depression, illegal drug usage and tobacco use    Objective:    BP 98/63   Pulse 76   Wt 192 lb 3.2 oz (87.2 kg)   LMP 12/26/2018   BMI 30.10 kg/m  General Appearance:    Alert, cooperative, no distress, appears stated age  Head:    Normocephalic, without obvious abnormality, atraumatic  Eyes:    PERRL, conjunctiva/corneas clear, EOM's intact, fundi    benign, both eyes  Ears:    Normal TM's and external ear  canals, both ears  Nose:   Nares normal, septum midline, mucosa normal, no drainage    or sinus tenderness  Throat:   Lips, mucosa, and tongue normal; teeth and gums normal  Neck:   Supple, symmetrical, trachea midline, no adenopathy;    thyroid:  no enlargement/tenderness/nodules; no carotid   bruit or JVD  Back:     Symmetric, no curvature, ROM normal, no CVA tenderness  Lungs:     Clear to auscultation bilaterally, respirations unlabored  Chest Wall:    No tenderness or deformity   Heart:    Regular rate and rhythm, S1 and S2 normal, no murmur, rub   or gallop  Breast Exam:    No tenderness, masses, or nipple abnormality  Abdomen:     Soft, non-tender, bowel sounds active all four quadrants,    no masses, no organomegaly  Genitalia:    Normal female without lesion, discharge or tenderness  Extremities:   Extremities normal, atraumatic, no cyanosis or edema  Pulses:   2+ and symmetric all extremities  Skin:   Skin color, texture, turgor normal, no rashes or lesions  Lymph nodes:   Cervical, supraclavicular, and axillary nodes normal  Neurologic:  CNII-XII intact, normal strength, sensation and reflexes    throughout      Lab Review Urine pregnancy test Labs reviewed yes Radiologic studies reviewed no  Assessment:    Pregnancy at 78w6dweeks    Plan:     1. Supervision of other normal pregnancy, antepartum Rx: - Cytology - PAP( [facility]) - Cervicovaginal ancillary only( [place]) - Culture, OB Urine - Obstetric Panel, Including HIV - Genetic Screening - Hepatitis C Antibody - Babyscripts Schedule Optimization  2. Previous cesarean delivery affecting pregnancy, antepartum - history of C/S for Breech Presentation  3. Patient desires vaginal birth after cesarean section (VBAC) - OK for VBAC  Prenatal vitamins.  Counseling provided regarding continued use of seat belts, cessation of alcohol consumption, smoking or use of illicit drugs; infection precautions i.e.,  influenza/TDAP immunizations, toxoplasmosis,CMV, parvovirus, listeria and varicella; workplace safety, exercise during pregnancy; routine dental care, safe medications, sexual activity, hot tubs, saunas, pools, travel, caffeine use, fish and methlymercury, potential toxins, hair treatments, varicose veins Weight gain recommendations per IOM guidelines reviewed: underweight/BMI< 18.5--> gain 28 - 40 lbs; normal weight/BMI 18.5 - 24.9--> gain 25 - 35 lbs; overweight/BMI 25 - 29.9--> gain 15 - 25 lbs; obese/BMI >30->gain  11 - 20 lbs Problem list reviewed and updated. FIRST/CF mutation testing/NIPT/QUAD SCREEN/fragile X/Ashkenazi Jewish population testing/Spinal muscular atrophy discussed: requested. Role of ultrasound in pregnancy discussed; fetal survey: requested. Amniocentesis discussed: not indicated. VBAC calculator score: VBAC consent form provided  Meds ordered this encounter  Medications  . Blood Pressure Monitoring (BLOOD PRESSURE KIT) DEVI    Sig: 1 kit by Does not apply route as needed.    Dispense:  1 each    Refill:  0  . Prenat w/o A-FeCbn-Meth-FA-DHA (PRENATE MINI) 29-0.6-0.4-350 MG CAPS    Sig: Take 1 capsule by mouth daily before breakfast.    Dispense:  90 capsule    Refill:  3   Orders Placed This Encounter  Procedures  . Culture, OB Urine  . [place] OB DETAIL +14 WK    Standing Status:   Future    Standing Expiration Date:   07/13/2020    Order Specific Question:   Reason for Exam (SYMPTOM  OR DIAGNOSIS REQUIRED)    Answer:   Anatomy    Order Specific Question:   Preferred Location    Answer:   [facility] . Obstetric Panel, Including HIV  . Genetic Screening  . Hepatitis C Antibody    Follow up in 4 weeks. 50% of 20 min visit spent on counseling and coordination of care.     HShelly Bombard MD 05/14/2019 2:29 PM

## 2019-05-15 ENCOUNTER — Other Ambulatory Visit: Payer: Self-pay | Admitting: Obstetrics

## 2019-05-15 DIAGNOSIS — B9689 Other specified bacterial agents as the cause of diseases classified elsewhere: Secondary | ICD-10-CM

## 2019-05-15 LAB — OBSTETRIC PANEL, INCLUDING HIV
Antibody Screen: NEGATIVE
Basophils Absolute: 0 10*3/uL (ref 0.0–0.2)
Basos: 0 %
EOS (ABSOLUTE): 0.1 10*3/uL (ref 0.0–0.4)
Eos: 1 %
HIV Screen 4th Generation wRfx: NONREACTIVE
Hematocrit: 37.5 % (ref 34.0–46.6)
Hemoglobin: 12.8 g/dL (ref 11.1–15.9)
Hepatitis B Surface Ag: NEGATIVE
Immature Grans (Abs): 0 10*3/uL (ref 0.0–0.1)
Immature Granulocytes: 1 %
Lymphocytes Absolute: 1.8 10*3/uL (ref 0.7–3.1)
Lymphs: 22 %
MCH: 32.5 pg (ref 26.6–33.0)
MCHC: 34.1 g/dL (ref 31.5–35.7)
MCV: 95 fL (ref 79–97)
Monocytes Absolute: 0.5 10*3/uL (ref 0.1–0.9)
Monocytes: 6 %
Neutrophils Absolute: 5.8 10*3/uL (ref 1.4–7.0)
Neutrophils: 70 %
Platelets: 290 10*3/uL (ref 150–450)
RBC: 3.94 x10E6/uL (ref 3.77–5.28)
RDW: 13 % (ref 11.7–15.4)
RPR Ser Ql: NONREACTIVE
Rh Factor: NEGATIVE
Rubella Antibodies, IGG: 1.8 index (ref >0.99)
WBC: 8.2 10*3/uL (ref 3.4–10.8)

## 2019-05-15 LAB — CERVICOVAGINAL ANCILLARY ONLY
Bacterial Vaginitis (gardnerella): POSITIVE — AB
Candida Glabrata: NEGATIVE
Candida Vaginitis: NEGATIVE
Chlamydia: NEGATIVE
Comment: NEGATIVE
Comment: NEGATIVE
Comment: NEGATIVE
Comment: NEGATIVE
Comment: NEGATIVE
Comment: NORMAL
Neisseria Gonorrhea: NEGATIVE
Trichomonas: NEGATIVE

## 2019-05-15 LAB — HEPATITIS C ANTIBODY: Hep C Virus Ab: 0.1 s/co ratio (ref 0.0–0.9)

## 2019-05-15 MED ORDER — METRONIDAZOLE 500 MG PO TABS: 500.0000 mg | ORAL_TABLET | Freq: Two times a day (BID) | ORAL | 2 refills | Status: DC

## 2019-05-16 LAB — URINE CULTURE, OB REFLEX

## 2019-05-16 LAB — CYTOLOGY - PAP

## 2019-05-16 LAB — CULTURE, OB URINE

## 2019-05-22 DIAGNOSIS — Z20828 Contact with and (suspected) exposure to other viral communicable diseases: Secondary | ICD-10-CM | POA: Diagnosis not present

## 2019-05-22 DIAGNOSIS — Z03818 Encounter for observation for suspected exposure to other biological agents ruled out: Secondary | ICD-10-CM | POA: Diagnosis not present

## 2019-05-23 ENCOUNTER — Encounter: Payer: Self-pay | Admitting: Obstetrics

## 2019-06-04 ENCOUNTER — Other Ambulatory Visit: Payer: Self-pay

## 2019-06-04 ENCOUNTER — Ambulatory Visit: Payer: Medicaid Other | Attending: Obstetrics

## 2019-06-04 ENCOUNTER — Other Ambulatory Visit: Payer: Self-pay | Admitting: *Deleted

## 2019-06-04 ENCOUNTER — Ambulatory Visit: Payer: Medicaid Other | Admitting: *Deleted

## 2019-06-04 VITALS — BP 104/64 | HR 94 | Temp 97.4°F

## 2019-06-04 DIAGNOSIS — O09292 Supervision of pregnancy with other poor reproductive or obstetric history, second trimester: Secondary | ICD-10-CM

## 2019-06-04 DIAGNOSIS — O352XX0 Maternal care for (suspected) hereditary disease in fetus, not applicable or unspecified: Secondary | ICD-10-CM | POA: Diagnosis not present

## 2019-06-04 DIAGNOSIS — Z3480 Encounter for supervision of other normal pregnancy, unspecified trimester: Secondary | ICD-10-CM | POA: Diagnosis not present

## 2019-06-04 DIAGNOSIS — O34219 Maternal care for unspecified type scar from previous cesarean delivery: Secondary | ICD-10-CM

## 2019-06-04 DIAGNOSIS — O0990 Supervision of high risk pregnancy, unspecified, unspecified trimester: Secondary | ICD-10-CM | POA: Insufficient documentation

## 2019-06-04 DIAGNOSIS — Z3A25 25 weeks gestation of pregnancy: Secondary | ICD-10-CM

## 2019-06-04 DIAGNOSIS — Z363 Encounter for antenatal screening for malformations: Secondary | ICD-10-CM | POA: Diagnosis not present

## 2019-06-04 DIAGNOSIS — Z3687 Encounter for antenatal screening for uncertain dates: Secondary | ICD-10-CM | POA: Diagnosis not present

## 2019-06-11 ENCOUNTER — Encounter (INDEPENDENT_AMBULATORY_CARE_PROVIDER_SITE_OTHER): Payer: Medicaid Other | Admitting: Women's Health

## 2019-06-11 NOTE — Progress Notes (Signed)
RN attempted to call patient x2, no answer. Patient signed on to MyChart spontaneously. Provider signed on, but connection was bad and video and audio froze repeatedly. Patient advised provider would call her. Provider called phone, rang, no answer, no voicemail. RN attempted to call again, no answer. Patient will need to reschedule.  Marylen Ponto, NP  10:28 AM 06/12/2019

## 2019-06-11 NOTE — Patient Instructions (Signed)
Maternity Assessment Unit (MAU)  The Maternity Assessment Unit (MAU) is located at the [facility] at [facility]. The address is: [address], [address], [place], [place] [address]. Please see map below for additional directions.    The Maternity Assessment Unit is designed to help you during your pregnancy, and for up to 6 weeks after delivery, with any pregnancy- or postpartum-related emergencies, if you think you are in labor, or if your water has broken. For example, if you experience nausea and vomiting, vaginal bleeding, severe abdominal or pelvic pain, elevated blood pressure or other problems related to your pregnancy or postpartum time, please come to the Maternity Assessment Unit for assistance.        Preterm Labor and Birth Information  The normal length of a pregnancy is 39-41 weeks. Preterm labor is when labor starts before 37 completed weeks of pregnancy. What are the risk factors for preterm labor? Preterm labor is more likely to occur in women who:  Have certain infections during pregnancy such as a bladder infection, sexually transmitted infection, or infection inside the uterus (chorioamnionitis).  Have a shorter-than-normal cervix.  Have gone into preterm labor before.  Have had surgery on their cervix.  Are younger than age 17 or older than age 35.  Are African American.  Are pregnant with twins or multiple babies (multiple gestation).  Take street drugs or smoke while pregnant.  Do not gain enough weight while pregnant.  Became pregnant shortly after having been pregnant. What are the symptoms of preterm labor? Symptoms of preterm labor include:  Cramps similar to those that can happen during a menstrual period. The cramps may happen with diarrhea.  Pain in the abdomen or lower back.  Regular uterine contractions that may feel like tightening of the abdomen.  A feeling of increased pressure in the  pelvis.  Increased watery or bloody mucus discharge from the vagina.  Water breaking (ruptured amniotic sac). Why is it important to recognize signs of preterm labor? It is important to recognize signs of preterm labor because babies who are born prematurely may not be fully developed. This can put them at an increased risk for:  Long-term (chronic) heart and lung problems.  Difficulty immediately after birth with regulating body systems, including blood sugar, body temperature, heart rate, and breathing rate.  Bleeding in the brain.  Cerebral palsy.  Learning difficulties.  Death. These risks are highest for babies who are born before 34 weeks of pregnancy. How is preterm labor treated? Treatment depends on the length of your pregnancy, your condition, and the health of your baby. It may involve:  Having a stitch (suture) placed in your cervix to prevent your cervix from opening too early (cerclage).  Taking or being given medicines, such as: ? Hormone medicines. These may be given early in pregnancy to help support the pregnancy. ? Medicine to stop contractions. ? Medicines to help mature the baby's lungs. These may be prescribed if the risk of delivery is high. ? Medicines to prevent your baby from developing cerebral palsy. If the labor happens before 34 weeks of pregnancy, you may need to stay in the hospital. What should I do if I think I am in preterm labor? If you think that you are going into preterm labor, call your health care provider right away. How can I prevent preterm labor in future pregnancies? To increase your chance of having a full-term pregnancy:  Do not use any tobacco products, such as   cigarettes, chewing tobacco, and e-cigarettes. If you need help quitting, ask your health care provider.  Do not use street drugs or medicines that have not been prescribed to you during your pregnancy.  Talk with your health care provider before taking any herbal  supplements, even if you have been taking them regularly.  Make sure you gain a healthy amount of weight during your pregnancy.  Watch for infection. If you think that you might have an infection, get it checked right away.  Make sure to tell your health care provider if you have gone into preterm labor before. This information is not intended to replace advice given to you by your health care provider. Make sure you discuss any questions you have with your health care provider. Document Revised: 05/03/2018 Document Reviewed: 06/02/2015 Elsevier Patient Education  2020 Elsevier Inc.  

## 2019-06-12 ENCOUNTER — Encounter: Payer: Self-pay | Admitting: Family Medicine

## 2019-06-17 ENCOUNTER — Telehealth (INDEPENDENT_AMBULATORY_CARE_PROVIDER_SITE_OTHER): Payer: Medicaid Other | Admitting: Advanced Practice Midwife

## 2019-06-17 DIAGNOSIS — O99891 Other specified diseases and conditions complicating pregnancy: Secondary | ICD-10-CM

## 2019-06-17 DIAGNOSIS — Z3A27 27 weeks gestation of pregnancy: Secondary | ICD-10-CM

## 2019-06-17 DIAGNOSIS — Z3480 Encounter for supervision of other normal pregnancy, unspecified trimester: Secondary | ICD-10-CM

## 2019-06-17 DIAGNOSIS — M549 Dorsalgia, unspecified: Secondary | ICD-10-CM

## 2019-06-17 DIAGNOSIS — O34219 Maternal care for unspecified type scar from previous cesarean delivery: Secondary | ICD-10-CM

## 2019-06-17 DIAGNOSIS — Z6791 Unspecified blood type, Rh negative: Secondary | ICD-10-CM | POA: Insufficient documentation

## 2019-06-17 DIAGNOSIS — O26899 Other specified pregnancy related conditions, unspecified trimester: Secondary | ICD-10-CM | POA: Insufficient documentation

## 2019-06-17 MED ORDER — COMFORT FIT MATERNITY SUPP MED MISC: 1.0000 | Freq: Every day | 0 refills | Status: DC

## 2019-06-17 NOTE — Progress Notes (Signed)
OBSTETRICS PRENATAL VIRTUAL VISIT ENCOUNTER NOTE  Provider [facility]   I connected with Phyllis Goodman on 06/17/19 at  2:20 PM EDT by MyChart Video Encounter at home and verified that I am speaking with the correct person using two identifiers.   I discussed the limitations, risks, security and privacy concerns of performing an evaluation and management service virtually and the availability of in person appointments. I also discussed with the patient that there may be a patient responsible charge related to this service. The patient expressed understanding and agreed to proceed. Subjective:  Phyllis Goodman is a 24 y.o. G2P1001 at 2269w3d being seen today for ongoing prenatal care.  She is currently monitored for the following issues for this low-risk pregnancy and has History of drug abuse [facility](HCC); ADHD (attention deficit hyperactivity disorder), combined type; Dysgraphia; and Supervision of other normal pregnancy, antepartum on their problem list.  Patient reports backache.  Contractions: Not present. Vag. Bleeding: None.  Movement: Present. Denies any leaking of fluid.   The following portions of the patient's history were reviewed and updated as appropriate: allergies, current medications, past family history, past medical history, past social history, past surgical history and problem list.   Objective:  There were no vitals filed for this visit.  Fetal Status:     Movement: Present     General:  Alert, oriented and cooperative. Patient is in no acute distress.  Respiratory: Normal respiratory effort, no problems with respiration noted  Mental Status: Normal mood and affect. Normal behavior. Normal judgment and thought content.  Rest of physical exam deferred due to type of encounter  Imaging: US MFM OB DETAIL +14 WK  Result Date: 06/04/2019 ----------------------------------------------------------------------  OBSTETRICS REPORT                          (Signed Final 06/04/2019 02:06 pm) ---------------------------------------------------------------------- Patient Info  ID #:        [id number]                          D.O.B.:  1995-11-05 (23 yrs)  Name:        Phyllis Goodman                Visit Date: 06/04/2019 12:22 pm ---------------------------------------------------------------------- Performed By  Attending:         Lin Landsmanorenthian Booker      Ref. Address:      [address]                                                               [place], [place]  [address]  Performed By:      Hurman Horn RDMS     [facility]  Referred By:       Brock Bad MD ---------------------------------------------------------------------- Orders  #   Description                          Code         Ordered By  1   [place] MFM OB DETAIL +14 WK              76811.01     Coral Ceo ----------------------------------------------------------------------  #   Order #                    Accession #                 Episode #  1   [id number]                  [id number]                  [id number] ---------------------------------------------------------------------- Indications  [redacted] weeks gestation of pregnancy                 Z3A.25  Encounter for uncertain dates                   Z36.87  Previous cesarean delivery, antepartum          O34.219  Previous child with congenital anomaly,         O35.2XX0  antepartum (trasnposition great vessels)  Encounter for antenatal screening for           Z36.3  malformations (low risk nips, neg horizon) ---------------------------------------------------------------------- Fetal Evaluation  Num Of Fetuses:          1  Fetal Heart Rate(bpm):   147  Cardiac Activity:        Observed   Presentation:            Breech  Placenta:                Posterior  P. Cord Insertion:       Visualized, central  Amniotic Fluid  AFI FV:      Within normal limits                              Largest Pocket(cm)                              5.4 ---------------------------------------------------------------------- Biometry  BPD:      61.9   mm     G. Age:  25w 1d         26  %    CI:         71.45  %  70 - 86                                                           FL/HC:       19.7  %    18.6 - 20.4  HC:      233.2   mm     G. Age:  25w 3d         20  %    HC/AC:       1.05       1.04 - 1.22  AC:      221.6   mm     G. Age:  26w 4d         73  %    FL/BPD:      74.3  %    71 - 87  FL:         46   mm     G. Age:  25w 2d         27  %    FL/AC:       20.8  %    20 - 24  HUM:      43.2   mm     G. Age:  25w 5d         51  %  CER:        29   mm     G. Age:  26w 0d         55  %  LV:         5.1  mm  CM:         5.4  mm  Est. FW:     869   gm    1 lb 15 oz      55  % ---------------------------------------------------------------------- OB History  Gravidity:     2         Term:  1 ---------------------------------------------------------------------- Gestational Age  LMP:            22w 6d       Date:  12/26/18                   EDD:  10/02/19  U/S Today:      25w 4d                                         EDD:  09/13/19  Best:           25w 4d    Det. By:  U/S (06/04/19)             EDD:  09/13/19 ---------------------------------------------------------------------- Anatomy  Cranium:                Appears normal         Aortic Arch:            Appears normal  Cavum:                  Appears normal         Ductal Arch:            Not well visualized  Ventricles:  Appears normal         Diaphragm:              Appears normal  Choroid Plexus:         Appears normal         Stomach:                Appears normal, left                                                                         sided   Cerebellum:             Appears normal         Abdomen:                Appears normal  Posterior Fossa:        Appears normal         Abdominal Wall:         Appears nml (cord                                                                         insert, abd wall)  Nuchal Fold:            Not applicable (>20    Cord Vessels:           Appears normal ([redacted]                          wks GA)                                        vessel cord)  Face:                   Appears normal         Kidneys:                Appear normal                          (orbits and profile)  Lips:                   Appears normal         Bladder:                Appears normal  Thoracic:               Appears normal         Spine:                  Appears normal  Heart:                  Appears normal         Upper Extremities:      Appears  normal                          (4CH, axis, and situs)  RVOT:                   Appears normal         Lower Extremities:      Appears normal  LVOT:                   Appears normal  Other:   Parents do not wish to know sex of fetus. Heels and 5th digit visualized.           Nasal bone visualized. ---------------------------------------------------------------------- Cervix Uterus Adnexa  Cervix  Not visualized (advanced GA >24wks)  Uterus  No abnormality visualized.  Right Ovary  Within normal limits.  Left Ovary  Within normal limits.  Adnexa  No abnormality visualized. ---------------------------------------------------------------------- Impression  Normal anatomy with measurements not consistent with dates,  therefore we have dated the pregnancy by today's ultrasound  Suboptimal views of the fetal anatomy was obtained secondary  to fetal position  Good fetal movement and amniotic fluid observed.  Lastly, Ms. Friesen has a prior pregnancy with a congenital  heart defect. I discussed with Ms. Barre the increased  recurrence of 2-5% of a congenital heart defect in this  pregnancy. Therefore, I  recommended a fetal echocardiogram  at the first available appointment, a referral was sent. I also  reiterated that we did not observe any anomalies during this  exam. I communicated her new due date of 09/13/19 ---------------------------------------------------------------------- Recommendations  Follow up growth in 4 weeks  Fetal echocardiogram sent. ----------------------------------------------------------------------               Lin Landsman, MD Electronically Signed Final Report   06/04/2019 02:06 pm ----------------------------------------------------------------------   Assessment and Plan:  Pregnancy: G2P1001 at [redacted]w[redacted]d 1. Supervision of other normal pregnancy, antepartum --Anticipatory guidance about next visits/weeks of pregnancy given. --Needs [place] and GTT, next visit in office in 1 week, then virtual visit in 5 weeks  2. Previous cesarean delivery affecting pregnancy, antepartum --Desires TOLAC. Consent signed 05/14/19.  3. Back pain affecting pregnancy in third trimester --Rest/ice/heat/warm bath/Tylenol/pregnancy support belt --Basic pregnancy restrictions letter sent to pt via MyChart. - Elastic Bandages & Supports (COMFORT FIT MATERNITY SUPP MED) MISC; 1 Device by Does not apply route daily.  Dispense: 1 each; Refill: 0  Preterm labor symptoms and general obstetric precautions including but not limited to vaginal bleeding, contractions, leaking of fluid and fetal movement were reviewed in detail with the patient. I discussed the assessment and treatment plan with the patient. The patient was provided an opportunity to ask questions and all were answered. The patient agreed with the plan and demonstrated an understanding of the instructions. The patient was advised to call back or seek an in-person office evaluation/go to [facility] at [facility] for any urgent or concerning symptoms. Please refer to After Visit Summary for other counseling recommendations.   I  provided 10 minutes of face-to-face time during this encounter.  Return in about 1 week (around 06/24/2019).  Future Appointments  Date Time Provider Department Center  07/03/2019  3:00 PM [facility] NURSE [facility]  07/03/2019  3:00 PM [facility] US[facility]    Sharen Counter, CNM [facility] [employer], [facility]

## 2019-06-17 NOTE — Progress Notes (Signed)
ROB

## 2019-06-19 DIAGNOSIS — O339 Maternal care for disproportion, unspecified: Secondary | ICD-10-CM | POA: Diagnosis not present

## 2019-06-26 ENCOUNTER — Encounter: Payer: Self-pay | Admitting: Obstetrics and Gynecology

## 2019-06-26 ENCOUNTER — Ambulatory Visit (INDEPENDENT_AMBULATORY_CARE_PROVIDER_SITE_OTHER): Payer: Medicaid Other | Admitting: Obstetrics and Gynecology

## 2019-06-26 ENCOUNTER — Other Ambulatory Visit: Payer: Medicaid Other

## 2019-06-26 ENCOUNTER — Other Ambulatory Visit: Payer: Self-pay

## 2019-06-26 VITALS — BP 94/59 | HR 77 | Wt 206.7 lb

## 2019-06-26 DIAGNOSIS — Z23 Encounter for immunization: Secondary | ICD-10-CM

## 2019-06-26 DIAGNOSIS — Z6791 Unspecified blood type, Rh negative: Secondary | ICD-10-CM | POA: Diagnosis not present

## 2019-06-26 DIAGNOSIS — Z8759 Personal history of other complications of pregnancy, childbirth and the puerperium: Secondary | ICD-10-CM

## 2019-06-26 DIAGNOSIS — O360130 Maternal care for anti-D [Rh] antibodies, third trimester, not applicable or unspecified: Secondary | ICD-10-CM

## 2019-06-26 DIAGNOSIS — O09299 Supervision of pregnancy with other poor reproductive or obstetric history, unspecified trimester: Secondary | ICD-10-CM

## 2019-06-26 DIAGNOSIS — Z3480 Encounter for supervision of other normal pregnancy, unspecified trimester: Secondary | ICD-10-CM

## 2019-06-26 DIAGNOSIS — Z3A28 28 weeks gestation of pregnancy: Secondary | ICD-10-CM | POA: Diagnosis not present

## 2019-06-26 DIAGNOSIS — O34219 Maternal care for unspecified type scar from previous cesarean delivery: Secondary | ICD-10-CM

## 2019-06-26 DIAGNOSIS — Z98891 History of uterine scar from previous surgery: Secondary | ICD-10-CM

## 2019-06-26 MED ORDER — RHO D IMMUNE GLOBULIN 1500 UNIT/2ML IJ SOSY
300.0000 ug | PREFILLED_SYRINGE | Freq: Once | INTRAMUSCULAR | Status: AC
  Administered 2019-06-26: 300 ug via INTRAMUSCULAR

## 2019-06-26 NOTE — Patient Instructions (Signed)
Third Trimester of Pregnancy The third trimester is from week 28 through week 40 (months 7 through 9). The third trimester is a time when the unborn baby (fetus) is growing rapidly. At the end of the ninth month, the fetus is about 20 inches in length and weighs 6-10 pounds. Body changes during your third trimester Your body will continue to go through many changes during pregnancy. The changes vary from woman to woman. During the third trimester:  Your weight will continue to increase. You can expect to gain 25-35 pounds (11-16 kg) by the end of the pregnancy.  You may begin to get stretch marks on your hips, abdomen, and breasts.  You may urinate more often because the fetus is moving lower into your pelvis and pressing on your bladder.  You may develop or continue to have heartburn. This is caused by increased hormones that slow down muscles in the digestive tract.  You may develop or continue to have constipation because increased hormones slow digestion and cause the muscles that push waste through your intestines to relax.  You may develop hemorrhoids. These are swollen veins (varicose veins) in the rectum that can itch or be painful.  You may develop swollen, bulging veins (varicose veins) in your legs.  You may have increased body aches in the pelvis, back, or thighs. This is due to weight gain and increased hormones that are relaxing your joints.  You may have changes in your hair. These can include thickening of your hair, rapid growth, and changes in texture. Some women also have hair loss during or after pregnancy, or hair that feels dry or thin. Your hair will most likely return to normal after your baby is born.  Your breasts will continue to grow and they will continue to become tender. A yellow fluid (colostrum) may leak from your breasts. This is the first milk you are producing for your baby.  Your belly button may stick out.  You may notice more swelling in your hands,  face, or ankles.  You may have increased tingling or numbness in your hands, arms, and legs. The skin on your belly may also feel numb.  You may feel short of breath because of your expanding uterus.  You may have more problems sleeping. This can be caused by the size of your belly, increased need to urinate, and an increase in your body's metabolism.  You may notice the fetus "dropping," or moving lower in your abdomen (lightening).  You may have increased vaginal discharge.  You may notice your joints feel loose and you may have pain around your pelvic bone. What to expect at prenatal visits You will have prenatal exams every 2 weeks until week 36. Then you will have weekly prenatal exams. During a routine prenatal visit:  You will be weighed to make sure you and the baby are growing normally.  Your blood pressure will be taken.  Your abdomen will be measured to track your baby's growth.  The fetal heartbeat will be listened to.  Any test results from the previous visit will be discussed.  You may have a cervical check near your due date to see if your cervix has softened or thinned (effaced).  You will be tested for Group B streptococcus. This happens between 35 and 37 weeks. Your health care provider may ask you:  What your birth plan is.  How you are feeling.  If you are feeling the baby move.  If you have had any abnormal   symptoms, such as leaking fluid, bleeding, severe headaches, or abdominal cramping.  If you are using any tobacco products, including cigarettes, chewing tobacco, and electronic cigarettes.  If you have any questions. Other tests or screenings that may be performed during your third trimester include:  Blood tests that check for low iron levels (anemia).  Fetal testing to check the health, activity level, and growth of the fetus. Testing is done if you have certain medical conditions or if there are problems during the pregnancy.  Nonstress test  (NST). This test checks the health of your baby to make sure there are no signs of problems, such as the baby not getting enough oxygen. During this test, a belt is placed around your belly. The baby is made to move, and its heart rate is monitored during movement. What is false labor? False labor is a condition in which you feel small, irregular tightenings of the muscles in the womb (contractions) that usually go away with rest, changing position, or drinking water. These are called Braxton Hicks contractions. Contractions may last for hours, days, or even weeks before true labor sets in. If contractions come at regular intervals, become more frequent, increase in intensity, or become painful, you should see your health care provider. What are the signs of labor?  Abdominal cramps.  Regular contractions that start at 10 minutes apart and become stronger and more frequent with time.  Contractions that start on the top of the uterus and spread down to the lower abdomen and back.  Increased pelvic pressure and dull back pain.  A watery or bloody mucus discharge that comes from the vagina.  Leaking of amniotic fluid. This is also known as your "water breaking." It could be a slow trickle or a gush. Let your health care provider know if it has a color or strange odor. If you have any of these signs, call your health care provider right away, even if it is before your due date. Follow these instructions at home: Medicines  Follow your health care provider's instructions regarding medicine use. Specific medicines may be either safe or unsafe to take during pregnancy.  Take a prenatal vitamin that contains at least 600 micrograms (mcg) of folic acid.  If you develop constipation, try taking a stool softener if your health care provider approves. Eating and drinking   Eat a balanced diet that includes fresh fruits and vegetables, whole grains, good sources of protein such as meat, eggs, or tofu,  and low-fat dairy. Your health care provider will help you determine the amount of weight gain that is right for you.  Avoid raw meat and uncooked cheese. These carry germs that can cause birth defects in the baby.  If you have low calcium intake from food, talk to your health care provider about whether you should take a daily calcium supplement.  Eat four or five small meals rather than three large meals a day.  Limit foods that are high in fat and processed sugars, such as fried and sweet foods.  To prevent constipation: ? Drink enough fluid to keep your urine clear or pale yellow. ? Eat foods that are high in fiber, such as fresh fruits and vegetables, whole grains, and beans. Activity  Exercise only as directed by your health care provider. Most women can continue their usual exercise routine during pregnancy. Try to exercise for 30 minutes at least 5 days a week. Stop exercising if you experience uterine contractions.  Avoid heavy lifting.  Do   not exercise in extreme heat or humidity, or at high altitudes.  Wear low-heel, comfortable shoes.  Practice good posture.  You may continue to have sex unless your health care provider tells you otherwise. Relieving pain and discomfort  Take frequent breaks and rest with your legs elevated if you have leg cramps or low back pain.  Take warm sitz baths to soothe any pain or discomfort caused by hemorrhoids. Use hemorrhoid cream if your health care provider approves.  Wear a good support bra to prevent discomfort from breast tenderness.  If you develop varicose veins: ? Wear support pantyhose or compression stockings as told by your healthcare provider. ? Elevate your feet for 15 minutes, 3-4 times a day. Prenatal care  Write down your questions. Take them to your prenatal visits.  Keep all your prenatal visits as told by your health care provider. This is important. Safety  Wear your seat belt at all times when driving.  Make  a list of emergency phone numbers, including numbers for family, friends, the hospital, and police and fire departments. General instructions  Avoid cat litter boxes and soil used by cats. These carry germs that can cause birth defects in the baby. If you have a cat, ask someone to clean the litter box for you.  Do not travel far distances unless it is absolutely necessary and only with the approval of your health care provider.  Do not use hot tubs, steam rooms, or saunas.  Do not drink alcohol.  Do not use any products that contain nicotine or tobacco, such as cigarettes and e-cigarettes. If you need help quitting, ask your health care provider.  Do not use any medicinal herbs or unprescribed drugs. These chemicals affect the formation and growth of the baby.  Do not douche or use tampons or scented sanitary pads.  Do not cross your legs for long periods of time.  To prepare for the arrival of your baby: ? Take prenatal classes to understand, practice, and ask questions about labor and delivery. ? Make a trial run to the hospital. ? Visit the hospital and tour the maternity area. ? Arrange for maternity or paternity leave through employers. ? Arrange for family and friends to take care of pets while you are in the hospital. ? Purchase a rear-facing car seat and make sure you know how to install it in your car. ? Pack your hospital bag. ? Prepare the baby's nursery. Make sure to remove all pillows and stuffed animals from the baby's crib to prevent suffocation.  Visit your dentist if you have not gone during your pregnancy. Use a soft toothbrush to brush your teeth and be gentle when you floss. Contact a health care provider if:  You are unsure if you are in labor or if your water has broken.  You become dizzy.  You have mild pelvic cramps, pelvic pressure, or nagging pain in your abdominal area.  You have lower back pain.  You have persistent nausea, vomiting, or  diarrhea.  You have an unusual or bad smelling vaginal discharge.  You have pain when you urinate. Get help right away if:  Your water breaks before 37 weeks.  You have regular contractions less than 5 minutes apart before 37 weeks.  You have a fever.  You are leaking fluid from your vagina.  You have spotting or bleeding from your vagina.  You have severe abdominal pain or cramping.  You have rapid weight loss or weight gain.  You have   shortness of breath with chest pain.  You notice sudden or extreme swelling of your face, hands, ankles, feet, or legs.  Your baby makes fewer than 10 movements in 2 hours.  You have severe headaches that do not go away when you take medicine.  You have vision changes. Summary  The third trimester is from week 28 through week 40, months 7 through 9. The third trimester is a time when the unborn baby (fetus) is growing rapidly.  During the third trimester, your discomfort may increase as you and your baby continue to gain weight. You may have abdominal, leg, and back pain, sleeping problems, and an increased need to urinate.  During the third trimester your breasts will keep growing and they will continue to become tender. A yellow fluid (colostrum) may leak from your breasts. This is the first milk you are producing for your baby.  False labor is a condition in which you feel small, irregular tightenings of the muscles in the womb (contractions) that eventually go away. These are called Braxton Hicks contractions. Contractions may last for hours, days, or even weeks before true labor sets in.  Signs of labor can include: abdominal cramps; regular contractions that start at 10 minutes apart and become stronger and more frequent with time; watery or bloody mucus discharge that comes from the vagina; increased pelvic pressure and dull back pain; and leaking of amniotic fluid. This information is not intended to replace advice given to you by your  health care provider. Make sure you discuss any questions you have with your health care provider. Document Revised: 05/02/2018 Document Reviewed: 02/15/2016 [employer]vier Patient Education  2020 [employer]  

## 2019-06-26 NOTE — Progress Notes (Signed)
Subjective:  Phyllis Goodman is a 24 y.o. G2P1001 at [redacted]w[redacted]d being seen today for ongoing prenatal care.  She is currently monitored for the following issues for this low-risk pregnancy and has History of drug abuse [facility]); ADHD (attention deficit hyperactivity disorder), combined type; Dysgraphia; Supervision of other normal pregnancy, antepartum; Rh negative state in antepartum period; History of cesarean section; and Prior pregnancy with congenital cardiac defect, antepartum on their problem list.  Patient reports no complaints.  Contractions: Not present. Vag. Bleeding: None.  Movement: Present. Denies leaking of fluid.   The following portions of the patient's history were reviewed and updated as appropriate: allergies, current medications, past family history, past medical history, past social history, past surgical history and problem list. Problem list updated.  Objective:   Vitals:   06/26/19 0855  BP: (!) 94/59  Pulse: 77  Weight: 206 lb 11.2 oz (93.8 kg)    Fetal Status: Fetal Heart Rate (bpm): 148   Movement: Present     General:  Alert, oriented and cooperative. Patient is in no acute distress.  Skin: Skin is warm and dry. No rash noted.   Cardiovascular: Normal heart rate noted  Respiratory: Normal respiratory effort, no problems with respiration noted  Abdomen: Soft, gravid, appropriate for gestational age. Pain/Pressure: Absent     Pelvic:  Cervical exam deferred        Extremities: Normal range of motion.  Edema: None  Mental Status: Normal mood and affect. Normal behavior. Normal judgment and thought content.   Urinalysis:      Assessment and Plan:  Pregnancy: G2P1001 at [redacted]w[redacted]d  1. Supervision of other normal pregnancy, antepartum Stable - Glucose Tolerance, 2 Hours w/1 Hour - CBC - RPR - HIV Antibody (routine testing w rflx) - Tdap vaccine greater than or equal to 7yo IM  2. Rh negative state in antepartum period  - rho (d) immune globulin (RHIG/RHOPHYLAC)  injection 300 mcg  3. History of cesarean section Desires TOLAC, consent signed  4. Prior pregnancy with congenital cardiac defect, antepartum Nl fetal ECHO 06/19/19  Preterm labor symptoms and general obstetric precautions including but not limited to vaginal bleeding, contractions, leaking of fluid and fetal movement were reviewed in detail with the patient. Please refer to After Visit Summary for other counseling recommendations.  Return in about 2 weeks (around 07/10/2019) for OB visit, virtual.   Hermina Staggers, MD

## 2019-06-26 NOTE — Progress Notes (Signed)
Pt is here for ROB and 2 hour GTT, [redacted]w[redacted]d. Rhogam today.

## 2019-06-27 LAB — CBC
Hematocrit: 35.9 % (ref 34.0–46.6)
Hemoglobin: 11.7 g/dL (ref 11.1–15.9)
MCH: 31.9 pg (ref 26.6–33.0)
MCHC: 32.6 g/dL (ref 31.5–35.7)
MCV: 98 fL — ABNORMAL HIGH (ref 79–97)
Platelets: 265 10*3/uL (ref 150–450)
RBC: 3.67 x10E6/uL — ABNORMAL LOW (ref 3.77–5.28)
RDW: 12.7 % (ref 11.7–15.4)
WBC: 7.5 10*3/uL (ref 3.4–10.8)

## 2019-06-27 LAB — GLUCOSE TOLERANCE, 2 HOURS W/ 1HR
Glucose, 1 hour: 106 mg/dL (ref 65–179)
Glucose, 2 hour: 86 mg/dL (ref 65–152)
Glucose, Fasting: 70 mg/dL (ref 65–91)

## 2019-06-27 LAB — RPR: RPR Ser Ql: NONREACTIVE

## 2019-06-27 LAB — HIV ANTIBODY (ROUTINE TESTING W REFLEX): HIV Screen 4th Generation wRfx: NONREACTIVE

## 2019-07-03 ENCOUNTER — Other Ambulatory Visit: Payer: Self-pay

## 2019-07-03 ENCOUNTER — Ambulatory Visit: Payer: Medicaid Other | Attending: Obstetrics and Gynecology

## 2019-07-03 ENCOUNTER — Ambulatory Visit: Payer: Medicaid Other | Admitting: *Deleted

## 2019-07-03 DIAGNOSIS — Z6791 Unspecified blood type, Rh negative: Secondary | ICD-10-CM | POA: Diagnosis not present

## 2019-07-03 DIAGNOSIS — O09292 Supervision of pregnancy with other poor reproductive or obstetric history, second trimester: Secondary | ICD-10-CM | POA: Diagnosis not present

## 2019-07-03 DIAGNOSIS — O26899 Other specified pregnancy related conditions, unspecified trimester: Secondary | ICD-10-CM | POA: Insufficient documentation

## 2019-07-03 DIAGNOSIS — Z3A29 29 weeks gestation of pregnancy: Secondary | ICD-10-CM

## 2019-07-03 DIAGNOSIS — O34219 Maternal care for unspecified type scar from previous cesarean delivery: Secondary | ICD-10-CM

## 2019-07-03 DIAGNOSIS — Z3687 Encounter for antenatal screening for uncertain dates: Secondary | ICD-10-CM | POA: Diagnosis not present

## 2019-07-03 DIAGNOSIS — O352XX0 Maternal care for (suspected) hereditary disease in fetus, not applicable or unspecified: Secondary | ICD-10-CM | POA: Diagnosis not present

## 2019-07-08 ENCOUNTER — Telehealth (INDEPENDENT_AMBULATORY_CARE_PROVIDER_SITE_OTHER): Payer: Medicaid Other | Admitting: Obstetrics & Gynecology

## 2019-07-08 VITALS — BP 116/64

## 2019-07-08 DIAGNOSIS — O34219 Maternal care for unspecified type scar from previous cesarean delivery: Secondary | ICD-10-CM

## 2019-07-08 DIAGNOSIS — O329XX0 Maternal care for malpresentation of fetus, unspecified, not applicable or unspecified: Secondary | ICD-10-CM

## 2019-07-08 DIAGNOSIS — O321XX0 Maternal care for breech presentation, not applicable or unspecified: Secondary | ICD-10-CM | POA: Diagnosis not present

## 2019-07-08 DIAGNOSIS — O26899 Other specified pregnancy related conditions, unspecified trimester: Secondary | ICD-10-CM

## 2019-07-08 DIAGNOSIS — Z3480 Encounter for supervision of other normal pregnancy, unspecified trimester: Secondary | ICD-10-CM

## 2019-07-08 DIAGNOSIS — O360130 Maternal care for anti-D [Rh] antibodies, third trimester, not applicable or unspecified: Secondary | ICD-10-CM

## 2019-07-08 DIAGNOSIS — Z6791 Unspecified blood type, Rh negative: Secondary | ICD-10-CM

## 2019-07-08 DIAGNOSIS — Z3A30 30 weeks gestation of pregnancy: Secondary | ICD-10-CM

## 2019-07-08 NOTE — Progress Notes (Signed)
   TELEHEALTH OBSTETRICS VISIT ENCOUNTER NOTE  I connected with Phyllis Goodman on 26/15/21 at  8:45 AM EDT by telephone at home and verified that I am speaking with the correct person using two identifiers.   I discussed the limitations, risks, security and privacy concerns of performing an evaluation and management service by telephone and the availability of in person appointments. I also discussed with the patient that there may be a patient responsible charge related to this service. The patient expressed understanding and agreed to proceed.  Subjective:  Phyllis Goodman is a 24 y.o. G2P1001 at [redacted]w[redacted]d being followed for ongoing prenatal care.  She is currently monitored for the following issues for this low-risk pregnancy and has History of drug abuse [facility]); ADHD (attention deficit hyperactivity disorder), combined type; Dysgraphia; Supervision of other normal pregnancy, antepartum; Rh negative state in antepartum period; History of cesarean section; and Prior pregnancy with congenital cardiac defect, antepartum on their problem list.  Patient reports no complaints. Reports fetal movement. Denies any contractions, bleeding or leaking of fluid.   The following portions of the patient's history were reviewed and updated as appropriate: allergies, current medications, past family history, past medical history, past social history, past surgical history and problem list.   Objective:   General:  Alert, oriented and cooperative.   Mental Status: Normal mood and affect perceived. Normal judgment and thought content.  Rest of physical exam deferred due to type of encounter  Assessment and Plan:  Pregnancy: G2P1001 at [redacted]w[redacted]d 1. Rh negative state in antepartum period S/p rhogam last visit.   2. Supervision of other normal pregnancy, antepartum Breech presentation on last U/s. Pt desires TOLAC, prior C/S for breech. Plan to repeat U/S for presentation at 36 weeks, schedule repeat C/S if still  breech at that time.   Preterm labor symptoms and general obstetric precautions including but not limited to vaginal bleeding, contractions, leaking of fluid and fetal movement were reviewed in detail with the patient.  I discussed the assessment and treatment plan with the patient. The patient was provided an opportunity to ask questions and all were answered. The patient agreed with the plan and demonstrated an understanding of the instructions. The patient was advised to call back or seek an in-person office evaluation/go to MAU at [facility] for any urgent or concerning symptoms. Please refer to After Visit Summary for other counseling recommendations.   I provided 8 minutes of non-face-to-face time during this encounter.  No follow-ups on file.  No future appointments.  Malachy Chamber, MD [facility] [employer], [facility]

## 2019-07-08 NOTE — Progress Notes (Signed)
S/w pt for virtual visit. Pt reports fetal movement and denies pain.

## 2019-07-10 ENCOUNTER — Telehealth: Payer: Medicaid Other | Admitting: Obstetrics and Gynecology

## 2019-07-22 ENCOUNTER — Encounter: Payer: Medicaid Other | Admitting: Obstetrics

## 2019-07-24 DIAGNOSIS — Z419 Encounter for procedure for purposes other than remedying health state, unspecified: Secondary | ICD-10-CM | POA: Diagnosis not present

## 2019-07-29 ENCOUNTER — Encounter: Payer: Medicaid Other | Admitting: Obstetrics

## 2019-08-19 ENCOUNTER — Other Ambulatory Visit: Payer: Self-pay

## 2019-08-19 ENCOUNTER — Ambulatory Visit: Payer: Medicaid Other | Attending: Obstetrics & Gynecology

## 2019-08-19 DIAGNOSIS — O329XX0 Maternal care for malpresentation of fetus, unspecified, not applicable or unspecified: Secondary | ICD-10-CM | POA: Diagnosis not present

## 2019-08-19 DIAGNOSIS — Z3A36 36 weeks gestation of pregnancy: Secondary | ICD-10-CM | POA: Diagnosis not present

## 2019-08-19 DIAGNOSIS — Z362 Encounter for other antenatal screening follow-up: Secondary | ICD-10-CM

## 2019-08-19 DIAGNOSIS — O352XX0 Maternal care for (suspected) hereditary disease in fetus, not applicable or unspecified: Secondary | ICD-10-CM

## 2019-08-19 DIAGNOSIS — O34219 Maternal care for unspecified type scar from previous cesarean delivery: Secondary | ICD-10-CM

## 2019-08-20 ENCOUNTER — Other Ambulatory Visit (HOSPITAL_COMMUNITY)
Admission: RE | Admit: 2019-08-20 | Discharge: 2019-08-20 | Disposition: A | Discharge status: Home / Self Care | Payer: Medicaid Other | Source: Ambulatory Visit | Attending: Advanced Practice Midwife | Admitting: Advanced Practice Midwife

## 2019-08-20 ENCOUNTER — Ambulatory Visit (INDEPENDENT_AMBULATORY_CARE_PROVIDER_SITE_OTHER): Payer: Medicaid Other | Admitting: Advanced Practice Midwife

## 2019-08-20 VITALS — BP 102/67 | HR 94 | Wt 211.0 lb

## 2019-08-20 DIAGNOSIS — O09299 Supervision of pregnancy with other poor reproductive or obstetric history, unspecified trimester: Secondary | ICD-10-CM

## 2019-08-20 DIAGNOSIS — Z3480 Encounter for supervision of other normal pregnancy, unspecified trimester: Secondary | ICD-10-CM | POA: Insufficient documentation

## 2019-08-20 DIAGNOSIS — R109 Unspecified abdominal pain: Secondary | ICD-10-CM

## 2019-08-20 DIAGNOSIS — O26893 Other specified pregnancy related conditions, third trimester: Secondary | ICD-10-CM

## 2019-08-20 DIAGNOSIS — Z3A36 36 weeks gestation of pregnancy: Secondary | ICD-10-CM

## 2019-08-20 DIAGNOSIS — Z98891 History of uterine scar from previous surgery: Secondary | ICD-10-CM

## 2019-08-20 DIAGNOSIS — Z3483 Encounter for supervision of other normal pregnancy, third trimester: Secondary | ICD-10-CM

## 2019-08-20 DIAGNOSIS — O09293 Supervision of pregnancy with other poor reproductive or obstetric history, third trimester: Secondary | ICD-10-CM

## 2019-08-20 NOTE — Progress Notes (Signed)
ROB/GBS.  C/o cramping 8-9/10 x 2 weeks.

## 2019-08-20 NOTE — Progress Notes (Signed)
   PRENATAL VISIT NOTE  Subjective:  Phyllis Goodman is a 24 y.o. G2P1001 at [redacted]w[redacted]d being seen today for ongoing prenatal care.  She is currently monitored for the following issues for this low-risk pregnancy and has History of drug abuse [facility]); ADHD (attention deficit hyperactivity disorder), combined type; Dysgraphia; Supervision of other normal pregnancy, antepartum; Rh negative state in antepartum period; History of cesarean section; and Prior pregnancy with congenital cardiac defect, antepartum on their problem list.  Patient reports abdominal pain right lower side, especially after working.  Contractions: Not present. Vag. Bleeding: None.  Movement: Present. Denies leaking of fluid.   The following portions of the patient's history were reviewed and updated as appropriate: allergies, current medications, past family history, past medical history, past social history, past surgical history and problem list.   Objective:   Vitals:   08/20/19 1542  BP: 102/67  Pulse: 94  Weight: (!) 211 lb (95.7 kg)    Fetal Status: Fetal Heart Rate (bpm): 134 Fundal Height: 36 cm Movement: Present  Presentation: Vertex  General:  Alert, oriented and cooperative. Patient is in no acute distress.  Skin: Skin is warm and dry. No rash noted.   Cardiovascular: Normal heart rate noted  Respiratory: Normal respiratory effort, no problems with respiration noted  Abdomen: Soft, gravid, appropriate for gestational age.  Pain/Pressure: Present     Pelvic: Cervical exam performed in the presence of a chaperone Dilation: Fingertip Effacement (%): 50 Station: -3  Extremities: Normal range of motion.  Edema: Trace  Mental Status: Normal mood and affect. Normal behavior. Normal judgment and thought content.   Assessment and Plan:  Pregnancy: G2P1001 at [redacted]w[redacted]d 1. Supervision of other normal pregnancy, antepartum --Anticipatory guidance about next visits/weeks of pregnancy given. --Next visit in 2 weeks in  office  - Cervicovaginal ancillary only( [place]) - Strep Gp B NAA  2. History of cesarean section --For breech presentation, desires VBAC, consent in chart  3. Prior pregnancy with congenital cardiac defect, antepartum --Cardiac echo wnl  4. Abdominal pain during pregnancy in third trimester --Pt has pain right lower abdomen, comes and goes x 2 weeks, worse after [employer] job. Is also lifting her son who weights ~ 30 lbs.   --No rebound tenderness or guarding on exam --Rest/ice/heat/warm bath/Tylenol/pregnancy support belt --Cervix 0.5/50/-3 today, no evidence of preterm labor   Preterm labor symptoms and general obstetric precautions including but not limited to vaginal bleeding, contractions, leaking of fluid and fetal movement were reviewed in detail with the patient. Please refer to After Visit Summary for other counseling recommendations.   Return in about 2 weeks (around 09/03/2019).  No future appointments.  Sharen Counter, CNM

## 2019-08-20 NOTE — Patient Instructions (Signed)
Labor Precautions Reasons to come to MAU at [facility]:  1.  Contractions are  5 minutes apart or less, each last 1 minute, these have been going on for 1-2 hours, and you cannot walk or talk during them 2.  You have a large gush of fluid, or a trickle of fluid that will not stop and you have to wear a pad 3.  You have bleeding that is bright red, heavier than spotting--like menstrual bleeding (spotting can be normal in early labor or after a check of your cervix) 4.  You do not feel the baby moving like he/she normally does  

## 2019-08-21 LAB — CERVICOVAGINAL ANCILLARY ONLY
Chlamydia: NEGATIVE
Comment: NEGATIVE
Comment: NEGATIVE
Comment: NORMAL
Neisseria Gonorrhea: NEGATIVE
Trichomonas: NEGATIVE

## 2019-08-22 ENCOUNTER — Encounter: Payer: Self-pay | Admitting: Certified Nurse Midwife

## 2019-08-22 DIAGNOSIS — B951 Streptococcus, group B, as the cause of diseases classified elsewhere: Secondary | ICD-10-CM | POA: Insufficient documentation

## 2019-08-22 LAB — STREP GP B NAA: Strep Gp B NAA: POSITIVE — AB

## 2019-08-24 DIAGNOSIS — Z419 Encounter for procedure for purposes other than remedying health state, unspecified: Secondary | ICD-10-CM | POA: Diagnosis not present

## 2019-09-03 ENCOUNTER — Ambulatory Visit (INDEPENDENT_AMBULATORY_CARE_PROVIDER_SITE_OTHER): Payer: Medicaid Other | Admitting: Obstetrics

## 2019-09-03 ENCOUNTER — Encounter: Payer: Self-pay | Admitting: Obstetrics

## 2019-09-03 ENCOUNTER — Other Ambulatory Visit: Payer: Self-pay

## 2019-09-03 VITALS — BP 107/66 | HR 94 | Wt 213.5 lb

## 2019-09-03 DIAGNOSIS — O99343 Other mental disorders complicating pregnancy, third trimester: Secondary | ICD-10-CM

## 2019-09-03 DIAGNOSIS — O09299 Supervision of pregnancy with other poor reproductive or obstetric history, unspecified trimester: Secondary | ICD-10-CM

## 2019-09-03 DIAGNOSIS — O99820 Streptococcus B carrier state complicating pregnancy: Secondary | ICD-10-CM

## 2019-09-03 DIAGNOSIS — Z6791 Unspecified blood type, Rh negative: Secondary | ICD-10-CM

## 2019-09-03 DIAGNOSIS — Z98891 History of uterine scar from previous surgery: Secondary | ICD-10-CM

## 2019-09-03 DIAGNOSIS — O360130 Maternal care for anti-D [Rh] antibodies, third trimester, not applicable or unspecified: Secondary | ICD-10-CM

## 2019-09-03 DIAGNOSIS — F902 Attention-deficit hyperactivity disorder, combined type: Secondary | ICD-10-CM

## 2019-09-03 DIAGNOSIS — Z3480 Encounter for supervision of other normal pregnancy, unspecified trimester: Secondary | ICD-10-CM

## 2019-09-03 DIAGNOSIS — O09293 Supervision of pregnancy with other poor reproductive or obstetric history, third trimester: Secondary | ICD-10-CM

## 2019-09-03 DIAGNOSIS — Z87898 Personal history of other specified conditions: Secondary | ICD-10-CM

## 2019-09-03 DIAGNOSIS — Z3A38 38 weeks gestation of pregnancy: Secondary | ICD-10-CM

## 2019-09-03 DIAGNOSIS — O34219 Maternal care for unspecified type scar from previous cesarean delivery: Secondary | ICD-10-CM

## 2019-09-03 NOTE — Progress Notes (Signed)
Subjective:  Phyllis Goodman is a 24 y.o. G2P1001 at [redacted]w[redacted]d being seen today for ongoing prenatal care.  She is currently monitored for the following issues for this low-risk pregnancy and has History of drug abuse [facility]); ADHD (attention deficit hyperactivity disorder), combined type; Dysgraphia; Supervision of other normal pregnancy, antepartum; Rh negative state in antepartum period; History of cesarean section; Prior pregnancy with congenital cardiac defect, antepartum; and Group beta Strep positive on their problem list.  Patient reports no complaints.  Contractions: Not present. Vag. Bleeding: None.  Movement: Present. Denies leaking of fluid.   The following portions of the patient's history were reviewed and updated as appropriate: allergies, current medications, past family history, past medical history, past social history, past surgical history and problem list. Problem list updated.  Objective:   Vitals:   09/03/19 1508  BP: 107/66  Pulse: 94  Weight: 213 lb 8 oz (96.8 kg)    Fetal Status:     Movement: Present     General:  Alert, oriented and cooperative. Patient is in no acute distress.  Skin: Skin is warm and dry. No rash noted.   Cardiovascular: Normal heart rate noted  Respiratory: Normal respiratory effort, no problems with respiration noted  Abdomen: Soft, gravid, appropriate for gestational age. Pain/Pressure: Present     Pelvic:  Cervical exam deferred        Extremities: Normal range of motion.  Edema: Trace  Mental Status: Normal mood and affect. Normal behavior. Normal judgment and thought content.   Urinalysis:      Assessment and Plan:  Pregnancy: G2P1001 at [redacted]w[redacted]d  1. Supervision of other normal pregnancy, antepartum   Term labor symptoms and general obstetric precautions including but not limited to vaginal bleeding, contractions, leaking of fluid and fetal movement were reviewed in detail with the patient. Please refer to After Visit Summary for other  counseling recommendations.   Return in about 1 week (around 09/10/2019) for ROB with midwife.   Brock Bad, MD  09/03/19

## 2019-09-03 NOTE — Progress Notes (Signed)
Pt presents for ROB has questions about EDD

## 2019-09-10 ENCOUNTER — Other Ambulatory Visit: Payer: Self-pay

## 2019-09-10 ENCOUNTER — Ambulatory Visit (INDEPENDENT_AMBULATORY_CARE_PROVIDER_SITE_OTHER): Payer: Medicaid Other | Admitting: Obstetrics

## 2019-09-10 ENCOUNTER — Encounter: Payer: Self-pay | Admitting: Obstetrics

## 2019-09-10 VITALS — BP 103/65 | HR 89 | Wt 213.0 lb

## 2019-09-10 DIAGNOSIS — Z3480 Encounter for supervision of other normal pregnancy, unspecified trimester: Secondary | ICD-10-CM

## 2019-09-10 DIAGNOSIS — Z6791 Unspecified blood type, Rh negative: Secondary | ICD-10-CM

## 2019-09-10 DIAGNOSIS — O360130 Maternal care for anti-D [Rh] antibodies, third trimester, not applicable or unspecified: Secondary | ICD-10-CM

## 2019-09-10 DIAGNOSIS — Z3A39 39 weeks gestation of pregnancy: Secondary | ICD-10-CM

## 2019-09-10 DIAGNOSIS — O34219 Maternal care for unspecified type scar from previous cesarean delivery: Secondary | ICD-10-CM

## 2019-09-10 DIAGNOSIS — Z98891 History of uterine scar from previous surgery: Secondary | ICD-10-CM

## 2019-09-10 NOTE — Progress Notes (Signed)
Subjective:  Phyllis Goodman is a 24 y.o. G2P1001 at [redacted]w[redacted]d being seen today for ongoing prenatal care.  She is currently monitored for the following issues for this low-risk pregnancy and has History of drug abuse [facility]); ADHD (attention deficit hyperactivity disorder), combined type; Dysgraphia; Supervision of other normal pregnancy, antepartum; Rh negative state in antepartum period; History of cesarean section; Prior pregnancy with congenital cardiac defect, antepartum; and Group beta Strep positive on their problem list.  Patient reports no complaints.  Contractions: Not present. Vag. Bleeding: None.  Movement: Present. Denies leaking of fluid.   The following portions of the patient's history were reviewed and updated as appropriate: allergies, current medications, past family history, past medical history, past social history, past surgical history and problem list. Problem list updated.  Objective:   Vitals:   09/10/19 1412  BP: 103/65  Pulse: 89  Weight: 213 lb (96.6 kg)    Fetal Status:     Movement: Present     General:  Alert, oriented and cooperative. Patient is in no acute distress.  Skin: Skin is warm and dry. No rash noted.   Cardiovascular: Normal heart rate noted  Respiratory: Normal respiratory effort, no problems with respiration noted  Abdomen: Soft, gravid, appropriate for gestational age. Pain/Pressure: Absent     Pelvic:  Cervical exam deferred        Extremities: Normal range of motion.  Edema: Trace  Mental Status: Normal mood and affect. Normal behavior. Normal judgment and thought content.   Urinalysis:      Assessment and Plan:  Pregnancy: G2P1001 at [redacted]w[redacted]d  1. Supervision of other normal pregnancy, antepartum  2. History of cesarean section  3. Patient desires vaginal birth after cesarean section (VBAC)  4. Rh negative state in antepartum period - Rhogam postpartum  Term labor symptoms and general obstetric precautions including but not limited to  vaginal bleeding, contractions, leaking of fluid and fetal movement were reviewed in detail with the patient. Please refer to After Visit Summary for other counseling recommendations.   Return in about 1 week (around 09/17/2019) for ROB.   Brock Bad, MD  09/10/19

## 2019-09-10 NOTE — Progress Notes (Signed)
ROB patient reports no complaints today.

## 2019-09-12 ENCOUNTER — Other Ambulatory Visit: Payer: Self-pay | Admitting: Advanced Practice Midwife

## 2019-09-16 ENCOUNTER — Telehealth (HOSPITAL_COMMUNITY): Payer: Self-pay | Admitting: *Deleted

## 2019-09-16 NOTE — Telephone Encounter (Signed)
Preadmission screen  

## 2019-09-17 ENCOUNTER — Telehealth (HOSPITAL_COMMUNITY): Payer: Self-pay | Admitting: *Deleted

## 2019-09-17 ENCOUNTER — Ambulatory Visit (INDEPENDENT_AMBULATORY_CARE_PROVIDER_SITE_OTHER): Payer: Medicaid Other | Admitting: Obstetrics

## 2019-09-17 ENCOUNTER — Other Ambulatory Visit: Payer: Self-pay

## 2019-09-17 ENCOUNTER — Encounter: Payer: Self-pay | Admitting: Obstetrics

## 2019-09-17 VITALS — BP 101/66 | HR 86 | Wt 217.0 lb

## 2019-09-17 DIAGNOSIS — Z3A40 40 weeks gestation of pregnancy: Secondary | ICD-10-CM

## 2019-09-17 DIAGNOSIS — O480 Post-term pregnancy: Secondary | ICD-10-CM

## 2019-09-17 DIAGNOSIS — O34219 Maternal care for unspecified type scar from previous cesarean delivery: Secondary | ICD-10-CM

## 2019-09-17 DIAGNOSIS — Z6791 Unspecified blood type, Rh negative: Secondary | ICD-10-CM

## 2019-09-17 DIAGNOSIS — Z98891 History of uterine scar from previous surgery: Secondary | ICD-10-CM

## 2019-09-17 DIAGNOSIS — O09299 Supervision of pregnancy with other poor reproductive or obstetric history, unspecified trimester: Secondary | ICD-10-CM

## 2019-09-17 DIAGNOSIS — O360130 Maternal care for anti-D [Rh] antibodies, third trimester, not applicable or unspecified: Secondary | ICD-10-CM

## 2019-09-17 DIAGNOSIS — O09293 Supervision of pregnancy with other poor reproductive or obstetric history, third trimester: Secondary | ICD-10-CM

## 2019-09-17 DIAGNOSIS — Z3480 Encounter for supervision of other normal pregnancy, unspecified trimester: Secondary | ICD-10-CM

## 2019-09-17 NOTE — Progress Notes (Signed)
Subjective:  Phyllis Goodman is a 24 y.o. G2P1001 at [redacted]w[redacted]d being seen today for ongoing prenatal care.  She is currently monitored for the following issues for this low-risk pregnancy and has History of drug abuse [facility]); ADHD (attention deficit hyperactivity disorder), combined type; Dysgraphia; Supervision of other normal pregnancy, antepartum; Rh negative state in antepartum period; History of cesarean section; Prior pregnancy with congenital cardiac defect, antepartum; and Group beta Strep positive on their problem list.  Patient reports occasional contractions.  Contractions: Irregular. Vag. Bleeding: None.  Movement: Present. Denies leaking of fluid.   The following portions of the patient's history were reviewed and updated as appropriate: allergies, current medications, past family history, past medical history, past social history, past surgical history and problem list. Problem list updated.  Objective:   Vitals:   09/17/19 1544  BP: 101/66  Pulse: 86  Weight: 217 lb (98.4 kg)    Fetal Status:     Movement: Present     General:  Alert, oriented and cooperative. Patient is in no acute distress.  Skin: Skin is warm and dry. No rash noted.   Cardiovascular: Normal heart rate noted  Respiratory: Normal respiratory effort, no problems with respiration noted  Abdomen: Soft, gravid, appropriate for gestational age. Pain/Pressure: Present     Pelvic:  Cervical exam deferred        Extremities: Normal range of motion.     Mental Status: Normal mood and affect. Normal behavior. Normal judgment and thought content.   Urinalysis:      Assessment and Plan:  Pregnancy: G2P1001 at [redacted]w[redacted]d  1. Supervision of other normal pregnancy, antepartum  2. History of cesarean section  3. Patient desires vaginal birth after cesarean section (VBAC)   4. Rh negative state in antepartum period - Rhogam postpartum  5. Prior pregnancy with congenital cardiac defect, antepartum   Term labor  symptoms and general obstetric precautions including but not limited to vaginal bleeding, contractions, leaking of fluid and fetal movement were reviewed in detail with the patient. Please refer to After Visit Summary for other counseling recommendations.   Return in about 4 weeks (around 10/15/2019) for postpartum visit.   Brock Bad, MD  09/17/19

## 2019-09-17 NOTE — Telephone Encounter (Signed)
Preadmission screen Message sent via [facility]

## 2019-09-18 ENCOUNTER — Other Ambulatory Visit (HOSPITAL_COMMUNITY)
Admission: RE | Admit: 2019-09-18 | Discharge: 2019-09-18 | Disposition: A | Discharge status: Home / Self Care | Payer: Medicaid Other | Source: Ambulatory Visit | Attending: Family Medicine | Admitting: Family Medicine

## 2019-09-18 DIAGNOSIS — Z20822 Contact with and (suspected) exposure to covid-19: Secondary | ICD-10-CM | POA: Insufficient documentation

## 2019-09-18 DIAGNOSIS — Z01812 Encounter for preprocedural laboratory examination: Secondary | ICD-10-CM | POA: Insufficient documentation

## 2019-09-18 LAB — SARS CORONAVIRUS 2 (TAT 6-24 HRS): SARS Coronavirus 2: NEGATIVE

## 2019-09-19 ENCOUNTER — Encounter (HOSPITAL_COMMUNITY): Payer: Self-pay | Admitting: Family Medicine

## 2019-09-19 ENCOUNTER — Inpatient Hospital Stay (HOSPITAL_COMMUNITY)
Admission: AD | Admit: 2019-09-19 | Discharge: 2019-09-21 | DRG: 806 | Disposition: A | Discharge status: Home / Self Care | Payer: Medicaid Other | Attending: Obstetrics & Gynecology | Admitting: Obstetrics & Gynecology

## 2019-09-19 ENCOUNTER — Inpatient Hospital Stay (HOSPITAL_COMMUNITY): Payer: Medicaid Other | Admitting: Anesthesiology

## 2019-09-19 ENCOUNTER — Other Ambulatory Visit: Payer: Self-pay

## 2019-09-19 DIAGNOSIS — B951 Streptococcus, group B, as the cause of diseases classified elsewhere: Secondary | ICD-10-CM | POA: Diagnosis present

## 2019-09-19 DIAGNOSIS — Z23 Encounter for immunization: Secondary | ICD-10-CM | POA: Diagnosis not present

## 2019-09-19 DIAGNOSIS — O26893 Other specified pregnancy related conditions, third trimester: Secondary | ICD-10-CM | POA: Diagnosis present

## 2019-09-19 DIAGNOSIS — Z3A41 41 weeks gestation of pregnancy: Secondary | ICD-10-CM | POA: Diagnosis not present

## 2019-09-19 DIAGNOSIS — O99214 Obesity complicating childbirth: Secondary | ICD-10-CM | POA: Diagnosis present

## 2019-09-19 DIAGNOSIS — Z87891 Personal history of nicotine dependence: Secondary | ICD-10-CM | POA: Diagnosis not present

## 2019-09-19 DIAGNOSIS — Z3A40 40 weeks gestation of pregnancy: Secondary | ICD-10-CM | POA: Diagnosis not present

## 2019-09-19 DIAGNOSIS — O99824 Streptococcus B carrier state complicating childbirth: Secondary | ICD-10-CM | POA: Diagnosis present

## 2019-09-19 DIAGNOSIS — Z6791 Unspecified blood type, Rh negative: Secondary | ICD-10-CM | POA: Diagnosis not present

## 2019-09-19 DIAGNOSIS — E669 Obesity, unspecified: Secondary | ICD-10-CM | POA: Diagnosis present

## 2019-09-19 DIAGNOSIS — Z3480 Encounter for supervision of other normal pregnancy, unspecified trimester: Secondary | ICD-10-CM

## 2019-09-19 DIAGNOSIS — Z98891 History of uterine scar from previous surgery: Secondary | ICD-10-CM

## 2019-09-19 DIAGNOSIS — O26899 Other specified pregnancy related conditions, unspecified trimester: Secondary | ICD-10-CM

## 2019-09-19 DIAGNOSIS — O34211 Maternal care for low transverse scar from previous cesarean delivery: Secondary | ICD-10-CM | POA: Diagnosis not present

## 2019-09-19 DIAGNOSIS — O34219 Maternal care for unspecified type scar from previous cesarean delivery: Secondary | ICD-10-CM | POA: Diagnosis not present

## 2019-09-19 DIAGNOSIS — O09299 Supervision of pregnancy with other poor reproductive or obstetric history, unspecified trimester: Secondary | ICD-10-CM

## 2019-09-19 DIAGNOSIS — Z20822 Contact with and (suspected) exposure to covid-19: Secondary | ICD-10-CM | POA: Diagnosis present

## 2019-09-19 DIAGNOSIS — O480 Post-term pregnancy: Secondary | ICD-10-CM | POA: Diagnosis not present

## 2019-09-19 LAB — CBC
HCT: 40 % (ref 36.0–46.0)
Hemoglobin: 13.3 g/dL (ref 12.0–15.0)
MCH: 31.7 pg (ref 26.0–34.0)
MCHC: 33.3 g/dL (ref 30.0–36.0)
MCV: 95.2 fL (ref 80.0–100.0)
Platelets: 264 10*3/uL (ref 150–400)
RBC: 4.2 MIL/uL (ref 3.87–5.11)
RDW: 14 % (ref 11.5–15.5)
WBC: 11.6 10*3/uL — ABNORMAL HIGH (ref 4.0–10.5)
nRBC: 0 % (ref 0.0–0.2)

## 2019-09-19 MED ORDER — FENTANYL-BUPIVACAINE-NACL 0.5-0.125-0.9 MG/250ML-% EP SOLN
12.0000 mL/h | EPIDURAL | Status: DC | PRN
  Filled 2019-09-19: qty 250

## 2019-09-19 MED ORDER — OXYCODONE-ACETAMINOPHEN 5-325 MG PO TABS: 2.0000 | ORAL_TABLET | ORAL | Status: DC | PRN

## 2019-09-19 MED ORDER — FENTANYL CITRATE (PF) 100 MCG/2ML IJ SOLN
100.0000 ug | INTRAMUSCULAR | Status: DC | PRN
  Administered 2019-09-19 (×4): 100 ug via INTRAVENOUS
  Filled 2019-09-19 (×4): qty 2

## 2019-09-19 MED ORDER — EPHEDRINE 5 MG/ML INJ: 10.0000 mg | INTRAVENOUS | Status: DC | PRN

## 2019-09-19 MED ORDER — DIPHENHYDRAMINE HCL 50 MG/ML IJ SOLN: 12.5000 mg | INTRAMUSCULAR | Status: DC | PRN

## 2019-09-19 MED ORDER — LIDOCAINE HCL (PF) 1 % IJ SOLN
INTRAMUSCULAR | Status: DC | PRN
  Administered 2019-09-19 (×2): 4 mL via EPIDURAL

## 2019-09-19 MED ORDER — ONDANSETRON HCL 4 MG/2ML IJ SOLN
4.0000 mg | Freq: Four times a day (QID) | INTRAMUSCULAR | Status: DC | PRN
  Administered 2019-09-19 – 2019-09-20 (×2): 4 mg via INTRAVENOUS
  Filled 2019-09-19 (×3): qty 2

## 2019-09-19 MED ORDER — LIDOCAINE HCL (PF) 1 % IJ SOLN: 30.0000 mL | INTRAMUSCULAR | Status: DC | PRN

## 2019-09-19 MED ORDER — OXYTOCIN BOLUS FROM INFUSION
333.0000 mL | Freq: Once | INTRAVENOUS | Status: AC
  Administered 2019-09-20: 333 mL via INTRAVENOUS

## 2019-09-19 MED ORDER — LACTATED RINGERS IV SOLN
500.0000 mL | INTRAVENOUS | Status: DC | PRN
  Administered 2019-09-20: 500 mL via INTRAVENOUS

## 2019-09-19 MED ORDER — FENTANYL CITRATE (PF) 2500 MCG/50ML IJ SOLN
INTRAMUSCULAR | Status: DC | PRN
Start: 2019-09-19 — End: 2019-09-20
  Administered 2019-09-19: 12 mL/h via EPIDURAL

## 2019-09-19 MED ORDER — NALBUPHINE HCL 10 MG/ML IJ SOLN
10.0000 mg | Freq: Once | INTRAMUSCULAR | Status: AC
  Administered 2019-09-19: 10 mg via INTRAVENOUS
  Filled 2019-09-19: qty 1

## 2019-09-19 MED ORDER — OXYTOCIN-SODIUM CHLORIDE 30-0.9 UT/500ML-% IV SOLN
2.5000 [IU]/h | INTRAVENOUS | Status: DC
  Filled 2019-09-19: qty 500

## 2019-09-19 MED ORDER — PENICILLIN G POT IN DEXTROSE 60000 UNIT/ML IV SOLN
3.0000 10*6.[IU] | INTRAVENOUS | Status: DC
  Administered 2019-09-19 (×2): 3 10*6.[IU] via INTRAVENOUS
  Filled 2019-09-19 (×2): qty 50

## 2019-09-19 MED ORDER — OXYCODONE-ACETAMINOPHEN 5-325 MG PO TABS: 1.0000 | ORAL_TABLET | ORAL | Status: DC | PRN

## 2019-09-19 MED ORDER — PHENYLEPHRINE 40 MCG/ML (10ML) SYRINGE FOR IV PUSH (FOR BLOOD PRESSURE SUPPORT)
80.0000 ug | PREFILLED_SYRINGE | INTRAVENOUS | Status: DC | PRN
  Administered 2019-09-19 (×2): 80 ug via INTRAVENOUS

## 2019-09-19 MED ORDER — TERBUTALINE SULFATE 1 MG/ML IJ SOLN: 0.2500 mg | Freq: Once | INTRAMUSCULAR | Status: DC | PRN

## 2019-09-19 MED ORDER — SOD CITRATE-CITRIC ACID 500-334 MG/5ML PO SOLN: 30.0000 mL | ORAL | Status: DC | PRN

## 2019-09-19 MED ORDER — ACETAMINOPHEN 325 MG PO TABS: 650.0000 mg | ORAL_TABLET | ORAL | Status: DC | PRN

## 2019-09-19 MED ORDER — PHENYLEPHRINE 40 MCG/ML (10ML) SYRINGE FOR IV PUSH (FOR BLOOD PRESSURE SUPPORT)
80.0000 ug | PREFILLED_SYRINGE | INTRAVENOUS | Status: DC | PRN
  Filled 2019-09-19: qty 10

## 2019-09-19 MED ORDER — OXYTOCIN-SODIUM CHLORIDE 30-0.9 UT/500ML-% IV SOLN
1.0000 m[IU]/min | INTRAVENOUS | Status: DC
  Administered 2019-09-19: 2 m[IU]/min via INTRAVENOUS
  Filled 2019-09-19: qty 500

## 2019-09-19 MED ORDER — LACTATED RINGERS IV SOLN: INTRAVENOUS | Status: DC

## 2019-09-19 MED ORDER — PROMETHAZINE HCL 25 MG/ML IJ SOLN
12.5000 mg | Freq: Once | INTRAMUSCULAR | Status: AC
  Administered 2019-09-19: 12.5 mg via INTRAVENOUS
  Filled 2019-09-19: qty 1

## 2019-09-19 MED ORDER — LACTATED RINGERS IV SOLN
500.0000 mL | Freq: Once | INTRAVENOUS | Status: AC
  Administered 2019-09-19: 500 mL via INTRAVENOUS

## 2019-09-19 MED ORDER — SODIUM CHLORIDE 0.9 % IV SOLN
5.0000 10*6.[IU] | Freq: Once | INTRAVENOUS | Status: AC
  Administered 2019-09-19: 5 10*6.[IU] via INTRAVENOUS
  Filled 2019-09-19: qty 5

## 2019-09-19 MED ORDER — NALBUPHINE HCL 10 MG/ML IJ SOLN: 10.0000 mg | INTRAMUSCULAR | Status: DC | PRN

## 2019-09-19 NOTE — Progress Notes (Signed)
Labor Progress Note Phyllis Goodman is a 23 y.o. G2P1001 at [redacted]w[redacted]d presented for SOL.  S: Endorsing continued lower back pain radiating down leg.  O:  BP 95/60   Pulse 74   Temp 98.2 F (36.8 C) (Oral)   Resp 15   Ht 5\' 7"  (1.702 m)   Wt 98 kg   LMP 12/26/2018   SpO2 100%   BMI 33.85 kg/m  EFM: 140/mod variability/+ accels/no decels  CVE: Dilation: 4 Effacement (%): 50 Cervical Position: Posterior Station: -2 Presentation: Vertex Exam by:: [email or web address] RN   A&P: 24 y.o. G2P1001 [redacted]w[redacted]d presenting for SOL. #Labor: Progressing well. Foley bulb now out. Cervix dilated to 4cm on last check. Will start Pitocin 2 x 2. #Pain: Per patient request, will desire epidural #FWB: Cat I #GBS positive, on PCN #RH negative: Evaluate for rhogam postpartum #LSIL Pap: Will need colposcopy postpartum  [redacted]w[redacted]d, MD 7:54 PM

## 2019-09-19 NOTE — Anesthesia Preprocedure Evaluation (Signed)
Anesthesia Evaluation  Patient identified by MRN, date of birth, ID band Patient awake    Reviewed: Allergy & Precautions, Patient's Chart, lab work & pertinent test results  Airway Mallampati: II  TM Distance: >3 FB Neck ROM: Full    Dental no notable dental hx. (+) Teeth Intact   Pulmonary former smoker,    Pulmonary exam normal breath sounds clear to auscultation       Cardiovascular negative cardio ROS Normal cardiovascular exam Rhythm:Regular Rate:Normal     Neuro/Psych PSYCHIATRIC DISORDERS ADHDnegative neurological ROS     GI/Hepatic Neg liver ROS, GERD  ,  Endo/Other  Obesity  Renal/GU negative Renal ROS  negative genitourinary   Musculoskeletal negative musculoskeletal ROS (+)   Abdominal (+) + obese,   Peds  Hematology negative hematology ROS (+)   Anesthesia Other Findings   Reproductive/Obstetrics (+) Pregnancy Previous C/Section                             Anesthesia Physical Anesthesia Plan  ASA: II  Anesthesia Plan: Epidural   Post-op Pain Management:    Induction:   PONV Risk Score and Plan:   Airway Management Planned: Natural Airway  Additional Equipment:   Intra-op Plan:   Post-operative Plan:   Informed Consent: I have reviewed the patients History and Physical, chart, labs and discussed the procedure including the risks, benefits and alternatives for the proposed anesthesia with the patient or authorized representative who has indicated his/her understanding and acceptance.       Plan Discussed with: Anesthesiologist  Anesthesia Plan Comments:         Anesthesia Quick Evaluation

## 2019-09-19 NOTE — Anesthesia Procedure Notes (Signed)
Epidural Patient location during procedure: OB Start time: 09/19/2019 8:21 PM End time: 09/19/2019 10:29 PM  Preanesthetic Checklist Completed: patient identified, IV checked, site marked, risks and benefits discussed, surgical consent, monitors and equipment checked, pre-op evaluation and timeout performed  Epidural Patient position: sitting Prep: DuraPrep and site prepped and draped Patient monitoring: continuous pulse ox and blood pressure Approach: midline Location: L3-L4 Injection technique: LOR air  Needle:  Needle type: Tuohy  Needle gauge: 17 G Needle length: 9 cm and 9 Needle insertion depth: 7 cm Catheter type: closed end flexible Catheter size: 19 Gauge Catheter at skin depth: 12 cm Test dose: negative and Other  Assessment Events: blood not aspirated, injection not painful, no injection resistance, no paresthesia and negative IV test  Additional Notes Patient identified. Risks and benefits discussed including failed block, incomplete  Pain control, post dural puncture headache, nerve damage, paralysis, blood pressure Changes, nausea, vomiting, reactions to medications-both toxic and allergic and post Partum back pain. All questions were answered. Patient expressed understanding and wished to proceed. Sterile technique was used throughout procedure. Epidural site was Dressed with sterile barrier dressing. No paresthesias, signs of intravascular injection Or signs of intrathecal spread were encountered.  Patient was more comfortable after the epidural was dosed. Please see RN's note for documentation of vital signs and FHR which are stable.

## 2019-09-19 NOTE — MAU Note (Signed)
.   Phyllis Goodman is a 24 y.o. at [redacted]w[redacted]d here in MAU reporting: ctx that started last night, Reports bloody show. Denies LOF. Endorses good fetal movement. GBS +. Induction scheduled Saturday.   Pain score: 8 Vitals:   09/19/19 1149  BP: 114/71  Pulse: 97  Resp: 15  Temp: 97.9 F (36.6 C)  SpO2: 100%     FHT:140

## 2019-09-19 NOTE — H&P (Addendum)
OBSTETRIC ADMISSION HISTORY AND PHYSICAL  Phyllis Goodman is a 24 y.o. female G2P1001 with IUP at 97w6dby 290 week[place] with spontaneous onset of labor. She reports +FMs, No LOF, no VB, no blurry vision, headaches or peripheral edema, and RUQ pain.  She plans on bottle feeding. She request POPs for birth control. [facility] [place] [facility] [place]--->  Estimated Date of Delivery: 09/13/19  Sono:    _0 , CWD, normal anatomy, cephalic presentation, posterior placenta, AFI WNL.  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 ADHD (attention deficit hyperactivity disorder)   . Allergy     Past Surgical History: Past Surgical History:  Procedure Laterality Date  . CESAREAN SECTION    . NO PAST SURGERIES      Obstetrical History: OB History    Gravida  2   Para  1   Term  1   Preterm  0   AB  0   Living  1     SAB  0   TAB  0   Ectopic  0   Multiple  0   Live Births  1           Social History Social History   Socioeconomic History  . Marital status: Single    Spouse name: Not on file  . Number of children: Not on file  . Years of education: Not on file  . Highest education level: Not on file  Occupational History  . Not on file  Tobacco Use  . Smoking status: Former Smoker    Packs/day: 0.10    Types: Cigarettes    Quit date: 12/2015    Years since quitting: 3.7  . Smokeless tobacco: Never Used  Substance and Sexual Activity  . Alcohol use: Not Currently    Comment: SOCIAL   . Drug use: No  . Sexual activity: Not Currently    Birth control/protection: None  Other Topics Concern  . Not on file  Social  History Narrative   ** Merged History Encounter **       Social Determinants of Health   Financial Resource Strain:   . Difficulty of Paying Living Expenses: Not on file  Food Insecurity:   . Worried About RCharity fundraiserin the Last Year: Not on file  . Ran Out of Food in the Last Year: Not on file  Transportation Needs:   . Lack of Transportation (Medical): Not on file  . Lack of Transportation (Non-Medical): Not on file  Physical Activity:   . Days of Exercise per Week: Not on file  . Minutes of Exercise per Session: Not on file  Stress:   . Feeling of Stress : Not on file  Social Connections:   . Frequency of Communication with Friends and Family: Not on file  . Frequency of Social Gatherings with Friends and Family: Not on file  . Attends Religious Services: Not on file  . Active Member of Clubs or Organizations: Not on file  . Attends CArchivistMeetings: Not on file  . Marital Status: Not on file    Family History: Family History  Problem Relation Age of Onset  .  Seizures Father     Allergies: No Known Allergies  Medications Prior to Admission  Medication Sig Dispense Refill Last Dose  . Prenat w/o A-FeCbn-Meth-FA-DHA (PRENATE MINI) 29-0.6-0.4-350 MG CAPS Take 1 capsule by mouth daily before breakfast. 90 capsule 3 09/19/2019 at Unknown time  . Blood Pressure Monitoring (BLOOD PRESSURE KIT) DEVI 1 kit by Does not apply route as needed. (Patient not taking: Reported on 09/03/2019) 1 each 0   . Elastic Bandages & Supports (COMFORT FIT MATERNITY SUPP MED) MISC 1 Device by Does not apply route daily. (Patient not taking: Reported on 09/03/2019) 1 each 0      Review of Systems   All systems reviewed and negative except as stated in HPI  Blood pressure 110/68, pulse 66, temperature 98.2 F (36.8 C), temperature source Oral, resp. rate 15, height 5' 7" (1.702 m), weight 98 kg, last menstrual period 12/26/2018, SpO2 100 %. General appearance: alert,  cooperative and no distress Lungs: clear to auscultation bilaterally Heart: regular rate and rhythm Abdomen: soft, non-tender; bowel sounds normal Pelvic: 2/50/ballotable Extremities: Nontender, no sign of DVT Presentation: cephalic Fetal monitoringBaseline: 125 bpm, Variability: Good {> 6 bpm), Accelerations: Reactive and Decelerations: Absent Uterine activity Irritability  Dilation: 2 Effacement (%): 50 Station: -3, Ballotable Exam by:: [employer] RN    Prenatal labs: ABO, Rh: --/--/O NEG (08/27 1248) Antibody: POS (08/27 1248) Rubella: 1.80 (04/21 1441) RPR: Non Reactive (06/03 1020)  HBsAg: Negative (04/21 1441)  HIV: Non Reactive (06/03 1020)  GBS: Positive/-- (07/28 1635)  1 hr Glucola passed Genetic screening negative Anatomy US normal  Prenatal Transfer Tool  Maternal Diabetes: No Genetic Screening: Normal Maternal Ultrasounds/Referrals: Normal Fetal Ultrasounds or other Referrals:  Fetal echo Maternal Substance Abuse:  No Significant Maternal Medications:  None Significant Maternal Lab Results: Group B Strep positive  Results for orders placed or performed during the hospital encounter of 09/19/19 (from the past 24 hour(s))  Type and screen   Collection Time: 09/19/19 12:48 PM  Result Value Ref Range   ABO/RH(D) O NEG    Antibody Screen POS    Sample Expiration 09/22/2019,2359    Antibody Identification      PASSIVELY ACQUIRED ANTI-D Performed at [facility], [address], [place], [place] [address]   CBC   Collection Time: 09/19/19  1:00 PM  Result Value Ref Range   WBC 11.6 (H) 4.0 - 10.5 K/uL   RBC 4.20 3.87 - 5.11 MIL/uL   Hemoglobin 13.3 12.0 - 15.0 g/dL   HCT 40.0 36 - 46 %   MCV 95.2 80.0 - 100.0 fL   MCH 31.7 26.0 - 34.0 pg   MCHC 33.3 30.0 - 36.0 g/dL   RDW 14.0 11.5 - 15.5 %   Platelets 264 150 - 400 K/uL   nRBC 0.0 0.0 - 0.2 %    Patient Active Problem List   Diagnosis Date Noted  . Post-dates pregnancy 09/19/2019  .  Group beta Strep positive 08/22/2019  . History of cesarean section 06/26/2019  . Prior pregnancy with congenital cardiac defect, antepartum 06/26/2019  . Rh negative state in antepartum period 06/17/2019  . Supervision of other normal pregnancy, antepartum 05/14/2019  . History of drug abuse ([place]) 07/28/2015  . ADHD (attention deficit hyperactivity disorder), combined type 06/28/2015  . Dysgraphia 06/28/2015    Assessment/Plan:  DESTINIE THORNSBERRY is a 24 y.o. G2P1001 at 47w6dhere for SOL.  #Induction of Labor: Dilated to 2cm on initial exam. TOLAC, cannot receive cytotec.  Foley bulb placed at 1634. Infrequent contractions. Will continue to monitor and start pitocin as indicated if contraction pattern remains poor. #Pain: Per patient request, will desire epidural #FWB: Cat I #ID: GBS positive, PCN #MOF: Bottle/still deciding #MOC: POPs #Circ:  Desires #RH negative: Evaluate for rhogam postpartum #LSIL Pap: Will need colposcopy postpartum  [place], MD  09/19/2019, 4:54 PM  Attestation of Supervision of Student:  I confirm that I have verified the information documented in the resident's note and that I have also personally reperformed the history, physical exam and all medical decision making activities.  I have verified that all services and findings are accurately documented in this student's note; and I agree with management and plan as outlined in the documentation. I have also made any necessary editorial changes.   Lajean Manes, [place] for [employer], [employer] 09/19/2019 6:00 PM

## 2019-09-20 ENCOUNTER — Encounter (HOSPITAL_COMMUNITY): Payer: Self-pay | Admitting: Obstetrics

## 2019-09-20 ENCOUNTER — Inpatient Hospital Stay (HOSPITAL_COMMUNITY)
Admission: AD | Admit: 2019-09-20 | Payer: Medicaid Other | Source: Home / Self Care | Admitting: Obstetrics and Gynecology

## 2019-09-20 ENCOUNTER — Inpatient Hospital Stay (HOSPITAL_COMMUNITY): Payer: Medicaid Other

## 2019-09-20 DIAGNOSIS — Z3A41 41 weeks gestation of pregnancy: Secondary | ICD-10-CM | POA: Diagnosis not present

## 2019-09-20 DIAGNOSIS — O99824 Streptococcus B carrier state complicating childbirth: Secondary | ICD-10-CM | POA: Diagnosis not present

## 2019-09-20 DIAGNOSIS — O34219 Maternal care for unspecified type scar from previous cesarean delivery: Secondary | ICD-10-CM

## 2019-09-20 DIAGNOSIS — O34211 Maternal care for low transverse scar from previous cesarean delivery: Secondary | ICD-10-CM | POA: Diagnosis not present

## 2019-09-20 DIAGNOSIS — O480 Post-term pregnancy: Secondary | ICD-10-CM | POA: Diagnosis not present

## 2019-09-20 LAB — TYPE AND SCREEN
ABO/RH(D): O NEG
Antibody Screen: POSITIVE

## 2019-09-20 LAB — RPR: RPR Ser Ql: NONREACTIVE

## 2019-09-20 LAB — CBC
HCT: 33.3 % — ABNORMAL LOW (ref 36.0–46.0)
Hemoglobin: 11 g/dL — ABNORMAL LOW (ref 12.0–15.0)
MCH: 31.1 pg (ref 26.0–34.0)
MCHC: 33 g/dL (ref 30.0–36.0)
MCV: 94.1 fL (ref 80.0–100.0)
Platelets: 208 10*3/uL (ref 150–400)
RBC: 3.54 MIL/uL — ABNORMAL LOW (ref 3.87–5.11)
RDW: 13.9 % (ref 11.5–15.5)
WBC: 16.2 10*3/uL — ABNORMAL HIGH (ref 4.0–10.5)
nRBC: 0 % (ref 0.0–0.2)

## 2019-09-20 MED ORDER — SENNOSIDES-DOCUSATE SODIUM 8.6-50 MG PO TABS
2.0000 | ORAL_TABLET | ORAL | Status: DC
  Administered 2019-09-21: 2 via ORAL
  Filled 2019-09-20: qty 2

## 2019-09-20 MED ORDER — MISOPROSTOL 200 MCG PO TABS
ORAL_TABLET | ORAL | Status: AC
  Filled 2019-09-20: qty 5

## 2019-09-20 MED ORDER — ONDANSETRON HCL 4 MG/2ML IJ SOLN: 4.0000 mg | INTRAMUSCULAR | Status: DC | PRN

## 2019-09-20 MED ORDER — DIPHENOXYLATE-ATROPINE 2.5-0.025 MG PO TABS
2.0000 | ORAL_TABLET | Freq: Once | ORAL | Status: AC
  Administered 2019-09-20: 2 via ORAL
  Filled 2019-09-20: qty 2

## 2019-09-20 MED ORDER — MISOPROSTOL 200 MCG PO TABS
1000.0000 ug | ORAL_TABLET | Freq: Once | ORAL | Status: AC
  Administered 2019-09-20: 1000 ug via RECTAL

## 2019-09-20 MED ORDER — TRANEXAMIC ACID-NACL 1000-0.7 MG/100ML-% IV SOLN
1000.0000 mg | Freq: Once | INTRAVENOUS | Status: AC
  Administered 2019-09-20: 1000 mg via INTRAVENOUS

## 2019-09-20 MED ORDER — OXYCODONE HCL 5 MG PO TABS: 5.0000 mg | ORAL_TABLET | ORAL | Status: DC | PRN

## 2019-09-20 MED ORDER — RHO D IMMUNE GLOBULIN 1500 UNIT/2ML IJ SOSY
300.0000 ug | PREFILLED_SYRINGE | Freq: Once | INTRAMUSCULAR | Status: AC
  Administered 2019-09-20: 300 ug via INTRAVENOUS
  Filled 2019-09-20: qty 2

## 2019-09-20 MED ORDER — SIMETHICONE 80 MG PO CHEW: 80.0000 mg | CHEWABLE_TABLET | ORAL | Status: DC | PRN

## 2019-09-20 MED ORDER — METHYLERGONOVINE MALEATE 0.2 MG/ML IJ SOLN: 0.2000 mg | Freq: Once | INTRAMUSCULAR | Status: AC

## 2019-09-20 MED ORDER — OXYCODONE HCL 5 MG PO TABS: 10.0000 mg | ORAL_TABLET | ORAL | Status: DC | PRN

## 2019-09-20 MED ORDER — PRENATAL MULTIVITAMIN CH
1.0000 | ORAL_TABLET | Freq: Every day | ORAL | Status: DC
  Administered 2019-09-20 – 2019-09-21 (×2): 1 via ORAL
  Filled 2019-09-20 (×2): qty 1

## 2019-09-20 MED ORDER — DIPHENHYDRAMINE HCL 25 MG PO CAPS: 25.0000 mg | ORAL_CAPSULE | Freq: Four times a day (QID) | ORAL | Status: DC | PRN

## 2019-09-20 MED ORDER — BENZOCAINE-MENTHOL 20-0.5 % EX AERO
1.0000 "application " | INHALATION_SPRAY | CUTANEOUS | Status: DC | PRN
  Administered 2019-09-21: 1 via TOPICAL
  Filled 2019-09-20: qty 56

## 2019-09-20 MED ORDER — ONDANSETRON HCL 4 MG PO TABS: 4.0000 mg | ORAL_TABLET | ORAL | Status: DC | PRN

## 2019-09-20 MED ORDER — DIBUCAINE (PERIANAL) 1 % EX OINT: 1.0000 "application " | TOPICAL_OINTMENT | CUTANEOUS | Status: DC | PRN

## 2019-09-20 MED ORDER — TETANUS-DIPHTH-ACELL PERTUSSIS 5-2.5-18.5 LF-MCG/0.5 IM SUSP
0.5000 mL | Freq: Once | INTRAMUSCULAR | Status: AC
  Administered 2019-09-21: 0.5 mL via INTRAMUSCULAR
  Filled 2019-09-20: qty 0.5

## 2019-09-20 MED ORDER — COCONUT OIL OIL: 1.0000 "application " | TOPICAL_OIL | Status: DC | PRN

## 2019-09-20 MED ORDER — CARBOPROST TROMETHAMINE 250 MCG/ML IM SOLN
INTRAMUSCULAR | Status: AC
  Filled 2019-09-20: qty 1

## 2019-09-20 MED ORDER — IBUPROFEN 600 MG PO TABS
600.0000 mg | ORAL_TABLET | Freq: Four times a day (QID) | ORAL | Status: DC
  Administered 2019-09-20 – 2019-09-21 (×6): 600 mg via ORAL
  Filled 2019-09-20 (×5): qty 1

## 2019-09-20 MED ORDER — WITCH HAZEL-GLYCERIN EX PADS: 1.0000 "application " | MEDICATED_PAD | CUTANEOUS | Status: DC | PRN

## 2019-09-20 MED ORDER — TRANEXAMIC ACID-NACL 1000-0.7 MG/100ML-% IV SOLN
INTRAVENOUS | Status: AC
  Filled 2019-09-20: qty 100

## 2019-09-20 MED ORDER — CARBOPROST TROMETHAMINE 250 MCG/ML IM SOLN
250.0000 ug | Freq: Once | INTRAMUSCULAR | Status: AC
  Administered 2019-09-20: 250 ug via INTRAMUSCULAR

## 2019-09-20 MED ORDER — METHYLERGONOVINE MALEATE 0.2 MG/ML IJ SOLN
INTRAMUSCULAR | Status: AC
  Administered 2019-09-20: 0.2 mg via INTRAMUSCULAR
  Filled 2019-09-20: qty 1

## 2019-09-20 MED ORDER — ACETAMINOPHEN 325 MG PO TABS
650.0000 mg | ORAL_TABLET | ORAL | Status: DC | PRN
  Administered 2019-09-20 – 2019-09-21 (×6): 650 mg via ORAL
  Filled 2019-09-20 (×6): qty 2

## 2019-09-20 NOTE — Discharge Instructions (Signed)

## 2019-09-20 NOTE — Discharge Summary (Signed)
Postpartum Discharge Summary      Patient Name: Phyllis Goodman DOB: [birth year] MRN: [id number]  Date of admission: 09/19/2019 Delivery date:09/20/2019  Delivering provider: Sharion Settler  Date of discharge: 09/21/2019  Admitting diagnosis: Post-dates pregnancy [O48.0] Intrauterine pregnancy: [redacted]w[redacted]d     Secondary diagnosis:  Active Problems:   Supervision of other normal pregnancy, antepartum   Rh negative state in antepartum period   History of cesarean section   Prior pregnancy with congenital cardiac defect, antepartum   Group beta Strep positive   Post-dates pregnancy   VBAC, delivered   [place] (postpartum hemorrhage)  Additional problems: none    Discharge diagnosis: Term Pregnancy Delivered and VBAC                                              Post partum procedures:rhogam Augmentation: Pitocin and IP Foley Complications: [id number]  Hospital course: Onset of Labor With Vaginal Delivery      24 y.o. yo G2P1001 at [redacted]w[redacted]d was admitted in Latent Labor on 09/19/2019. Patient was augmented with pitocin and FB and had a successful VBAC, complicated by a PPH. Patient had an uncomplicated labor course as follows:  Membrane Rupture Time/Date: 10:55 PM ,09/19/2019   Delivery Method:Vaginal, Spontaneous  Episiotomy: None  Lacerations:  Sulcus;Labial bilateral sulcal (repaired) and bilateral labial (hemostatic) Patient had an uncomplicated postpartum course.  She is ambulating, tolerating a regular diet, passing flatus, and urinating well. Patient is discharged home in stable condition on 09/21/19.  Newborn Data: Birth date:09/20/2019  Birth time:2:35 AM  Gender:Female  Living status:Living  Apgars:9 ,9  Weight:3521 g   Magnesium Sulfate received: No BMZ received: No Rhophylac:Yes MMR:N/A T-DaP:Given prenatally Flu: N/A Transfusion:No  Physical exam  Vitals:   09/20/19 1432 09/20/19 1807 09/20/19 2100 09/21/19 0547  BP: 95/62 90/60 (!) 94/58 (!) 89/56   Pulse: 79 69 79 73  Resp: $Remo'18 18 16 15  'Npngl$ Temp: 98 F (36.7 C) 98.1 F (36.7 C) 98.3 F (36.8 C) 98.7 F (37.1 C)  TempSrc: Axillary Oral Axillary Axillary  SpO2:    96%  Weight:      Height:       General: alert, cooperative and no distress Lochia: appropriate Uterine Fundus: firm Incision: N/A DVT Evaluation: No evidence of DVT seen on physical exam. Labs: Lab Results  Component Value Date   WBC 16.2 (H) 09/20/2019   HGB 11.0 (L) 09/20/2019   HCT 33.3 (L) 09/20/2019   MCV 94.1 09/20/2019   PLT 208 09/20/2019   No flowsheet data found. Edinburgh Score: Edinburgh Postnatal Depression Scale Screening Tool 09/20/2019  I have been able to laugh and see the funny side of things. 0  I have looked forward with enjoyment to things. 1  I have blamed myself unnecessarily when things went wrong. 3  I have been anxious or worried for no good reason. 2  I have felt scared or panicky for no good reason. 1  Things have been getting on top of me. 2  I have been so unhappy that I have had difficulty sleeping. 2  I have felt sad or miserable. 1  I have been so unhappy that I have been crying. 1  The thought of harming myself has occurred to me. 0  Edinburgh Postnatal Depression Scale Total 13     After visit meds:  Allergies as of  09/21/2019   No Known Allergies     Medication List    STOP taking these medications   Blood Pressure Kit Chemical engineer Maternity Supp Med Misc   Prenate Mini 29-0.6-0.4-350 MG Caps     TAKE these medications   acetaminophen 500 MG tablet Commonly known as: TYLENOL Take 2 tablets (1,000 mg total) by mouth every 4 (four) hours as needed (for pain scale < 4).   ibuprofen 600 MG tablet Commonly known as: ADVIL Take 1 tablet (600 mg total) by mouth every 6 (six) hours.   Norgestimate-Ethinyl Estradiol Triphasic 0.18/0.215/0.25 MG-25 MCG tab Commonly known as: Ortho Tri-Cyclen Lo Take 1 tablet by mouth daily.        Discharge home in  stable condition Infant Feeding: Breast Infant Disposition:home with mother Discharge instruction: per After Visit Summary and Postpartum booklet. Activity: Advance as tolerated. Pelvic rest for 6 weeks.  Diet: routine diet Future Appointments:No future appointments. Follow up Visit:   Please schedule this patient for a In person postpartum visit in 4 weeks with the following provider: Any provider. Additional Postpartum F/U:none  Low risk pregnancy complicated by: none Delivery mode:  Vaginal, Spontaneous  Anticipated Birth Control:  OCPs   0/30/1499 Arrie Senate, MD

## 2019-09-20 NOTE — Progress Notes (Signed)
Patient c/o reddened area on upper right leg where she states she received an injection in labor and delivery. Patient's leg has an circle approximately 3 inches in diameter that is reddened, flat, slightly warm to touch. Ice applied, which has relieved discomfort. Message left for [employer].

## 2019-09-20 NOTE — Anesthesia Postprocedure Evaluation (Signed)
Anesthesia Post Note  Patient: Reema R Witherow  Procedure(s) Performed: AN AD HOC LABOR EPIDURAL     Patient location during evaluation: Mother Baby Anesthesia Type: Epidural Level of consciousness: awake and alert Pain management: pain level controlled Vital Signs Assessment: post-procedure vital signs reviewed and stable Respiratory status: spontaneous breathing Cardiovascular status: blood pressure returned to baseline Postop Assessment: no headache, adequate PO intake, no backache, patient able to bend at knees, able to ambulate, epidural receding and no apparent nausea or vomiting Anesthetic complications: no   No complications documented.  Last Vitals:  Vitals:   09/20/19 1027 09/20/19 1432  BP: (!) 89/51 95/62  Pulse: 72 79  Resp: 18 18  Temp: 36.9 C 36.7 C  SpO2:      Last Pain:  Vitals:   09/20/19 1432  TempSrc: Axillary  PainSc: 0-No pain   Pain Goal: Patients Stated Pain Goal: 3 (09/20/19 0914)                 Salome Arnt

## 2019-09-21 LAB — RH IG WORKUP (INCLUDES ABO/RH)
ABO/RH(D): O NEG
Fetal Screen: NEGATIVE
Gestational Age(Wks): 41
Unit division: 0

## 2019-09-21 MED ORDER — NORGESTIM-ETH ESTRAD TRIPHASIC 0.18/0.215/0.25 MG-25 MCG PO TABS: 1.0000 | ORAL_TABLET | Freq: Every day | ORAL | 11 refills | Status: DC

## 2019-09-21 MED ORDER — ACETAMINOPHEN 500 MG PO TABS
1000.0000 mg | ORAL_TABLET | ORAL | 0 refills | Status: DC | PRN
Start: 2019-09-21 — End: 2022-01-03

## 2019-09-21 MED ORDER — IBUPROFEN 600 MG PO TABS
600.0000 mg | ORAL_TABLET | Freq: Four times a day (QID) | ORAL | 0 refills | Status: DC
Start: 2019-09-21 — End: 2022-01-03

## 2019-09-21 NOTE — Progress Notes (Signed)
CSW acknowledged consult for hx of SA use. CSW completed chart review and is screening out SA consult.  MOB had SA hx however not during this pregnancy.   CSW received consult due to score 13 on Edinburgh Depression Screen.    When CSW arrived, MOB was bottle feeding infant and MOB's mother was watching TV. CSW explained CSW's role and MOB gave CSW permission to complete assessment while MOB's mother was present. The family appeared happy and MOB's mother expressed being a primary support for MOB.  MOB was easy to engage and was receptive to meeting with CSW.  CSW reviewed MOB's Edinburgh results and  provided education regarding Baby Blues vs PMADs and provided MOB with resources for mental health follow up. MOB denied having any PMAD symptoms with MOB's oldest child.  CSW encouraged MOB to evaluate her mental health throughout the postpartum period with the use of the New Mom Checklist developed by Postpartum Progress as well as the Edinburgh Postnatal Depression Scale and notify a medical professional if symptoms arise. MOB presented with insight and awareness and did not display an acute MH symptoms. MOB reported feeling comfortable seeking help if needed and denied SI and HI when assessed for safety.  MOB reports having all essential items for infant and feeling prepared to parent post discharge.   There are no barriers to discharge.   Pat Elicker Boyd-Gilyard, MSW, LCSW Clinical Social Work [phone]  

## 2019-09-22 ENCOUNTER — Telehealth: Payer: Self-pay | Admitting: *Deleted

## 2019-09-22 NOTE — Telephone Encounter (Signed)
Transition Care Management Unsuccessful Follow-up Telephone Call  Date of discharge and from where:  [facility], 09/21/19  Attempts:  1st Attempt  Reason for unsuccessful TCM follow-up call:  Voice mail full.   Burnard Bunting, RN, BSN, CCRN Patient Engagement Center [phone]

## 2019-09-24 DIAGNOSIS — Z419 Encounter for procedure for purposes other than remedying health state, unspecified: Secondary | ICD-10-CM | POA: Diagnosis not present

## 2019-09-25 ENCOUNTER — Telehealth: Payer: Self-pay | Admitting: *Deleted

## 2019-09-25 NOTE — Telephone Encounter (Signed)
Transition Care Management Unsuccessful Follow-up Telephone Call  Date of discharge and from where: 09/21/19, [facility]  Attempts:  2nd Attempt  Reason for unsuccessful TCM follow-up call:  Voice mail full.  Burnard Bunting, RN, BSN, CCRN Patient Engagement Center [phone]

## 2019-10-20 ENCOUNTER — Ambulatory Visit: Payer: Medicaid Other | Admitting: Advanced Practice Midwife

## 2019-10-22 ENCOUNTER — Ambulatory Visit: Payer: Medicaid Other | Admitting: Obstetrics and Gynecology

## 2019-10-24 DIAGNOSIS — Z419 Encounter for procedure for purposes other than remedying health state, unspecified: Secondary | ICD-10-CM | POA: Diagnosis not present

## 2019-11-24 DIAGNOSIS — Z419 Encounter for procedure for purposes other than remedying health state, unspecified: Secondary | ICD-10-CM | POA: Diagnosis not present

## 2019-12-24 DIAGNOSIS — Z419 Encounter for procedure for purposes other than remedying health state, unspecified: Secondary | ICD-10-CM | POA: Diagnosis not present

## 2020-01-24 DIAGNOSIS — Z419 Encounter for procedure for purposes other than remedying health state, unspecified: Secondary | ICD-10-CM | POA: Diagnosis not present

## 2020-02-24 DIAGNOSIS — Z419 Encounter for procedure for purposes other than remedying health state, unspecified: Secondary | ICD-10-CM | POA: Diagnosis not present

## 2020-03-23 DIAGNOSIS — Z419 Encounter for procedure for purposes other than remedying health state, unspecified: Secondary | ICD-10-CM | POA: Diagnosis not present

## 2020-04-23 DIAGNOSIS — Z419 Encounter for procedure for purposes other than remedying health state, unspecified: Secondary | ICD-10-CM | POA: Diagnosis not present

## 2020-05-23 DIAGNOSIS — Z419 Encounter for procedure for purposes other than remedying health state, unspecified: Secondary | ICD-10-CM | POA: Diagnosis not present

## 2020-06-23 DIAGNOSIS — Z419 Encounter for procedure for purposes other than remedying health state, unspecified: Secondary | ICD-10-CM | POA: Diagnosis not present

## 2020-07-23 DIAGNOSIS — Z419 Encounter for procedure for purposes other than remedying health state, unspecified: Secondary | ICD-10-CM | POA: Diagnosis not present

## 2020-08-23 DIAGNOSIS — Z419 Encounter for procedure for purposes other than remedying health state, unspecified: Secondary | ICD-10-CM | POA: Diagnosis not present

## 2020-09-23 DIAGNOSIS — Z419 Encounter for procedure for purposes other than remedying health state, unspecified: Secondary | ICD-10-CM | POA: Diagnosis not present

## 2020-10-23 DIAGNOSIS — Z419 Encounter for procedure for purposes other than remedying health state, unspecified: Secondary | ICD-10-CM | POA: Diagnosis not present

## 2020-11-17 IMAGING — US US MFM OB DETAIL+14 WK
1 series · 13 of 28 positions shown · non-contrast
Comparison: none

[Series 1: us mfm ob detail+14 wk · 92 acquisitions, 13 frames shown]
[im 4/92]
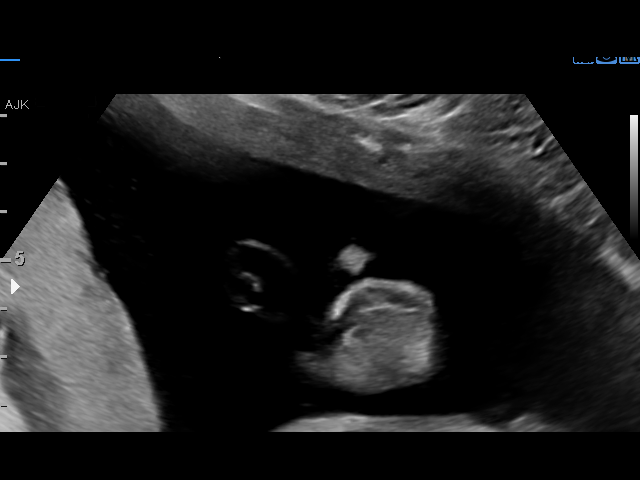
[im 11/92]
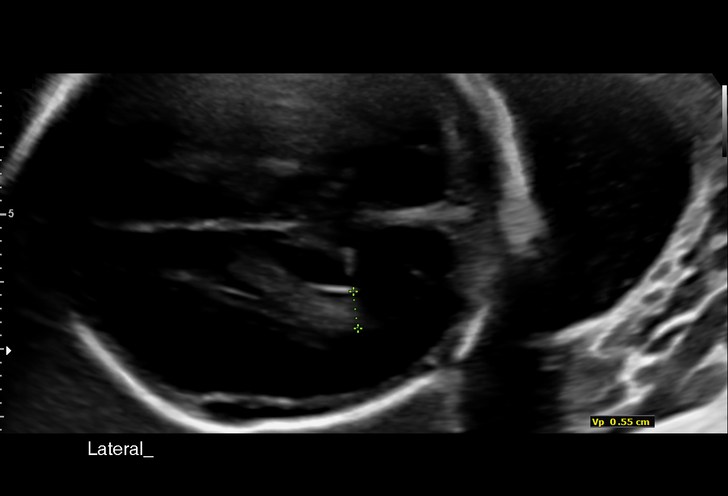
[im 17/92]
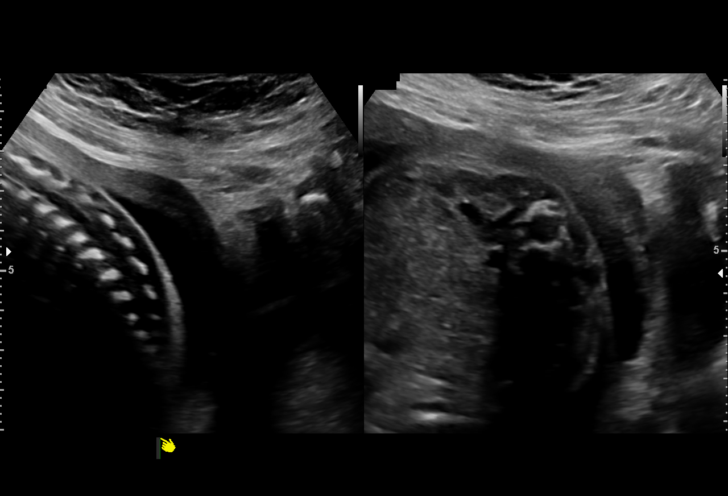
[im 24/92]
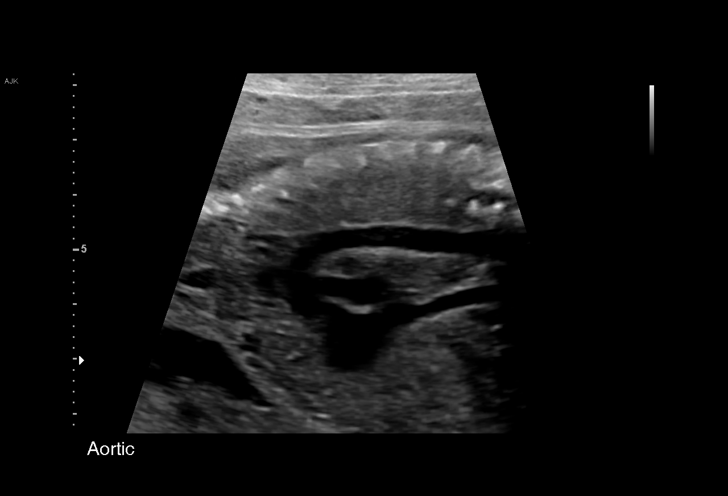
[im 31/92]
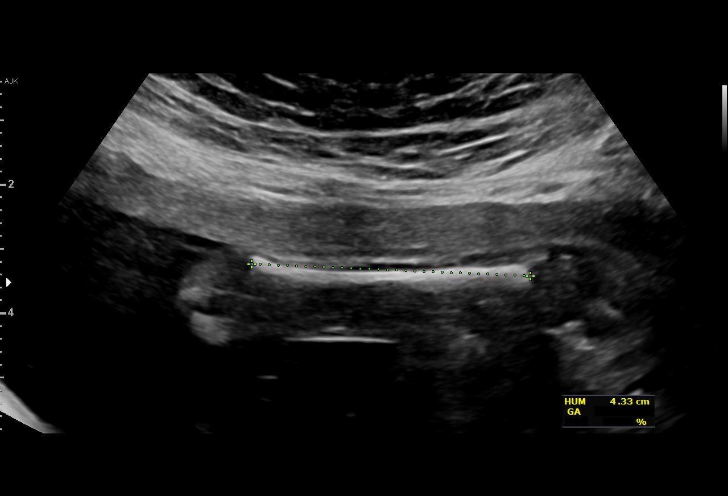
[im 38/92]
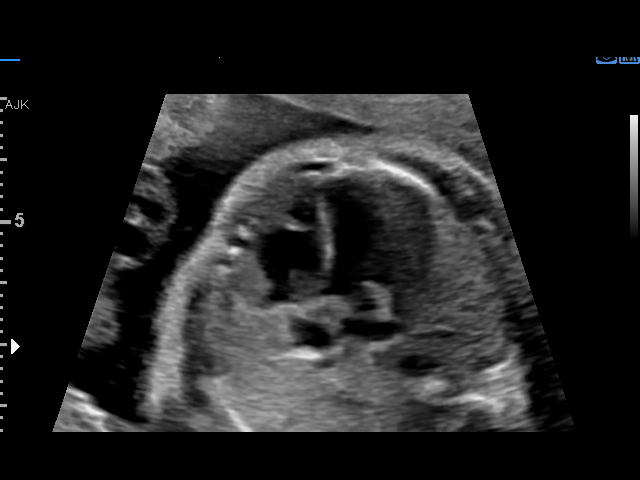
[im 48/92]
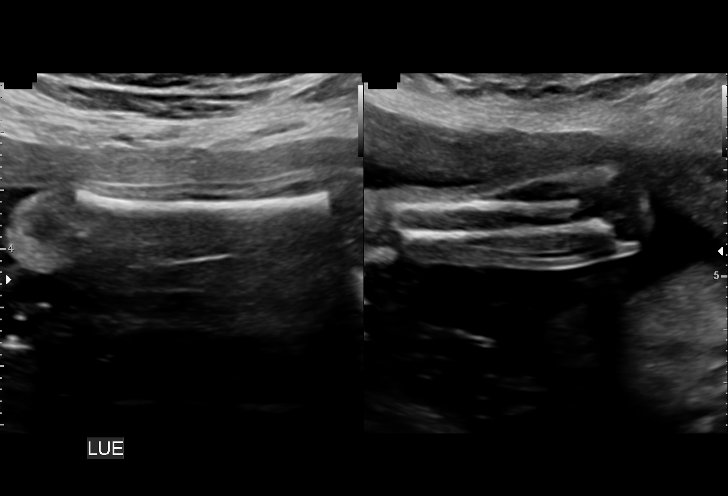
[im 54/92]
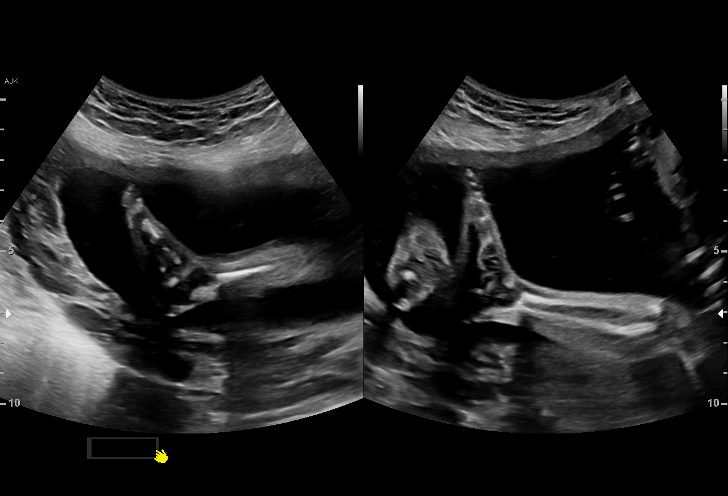
[im 61/92]
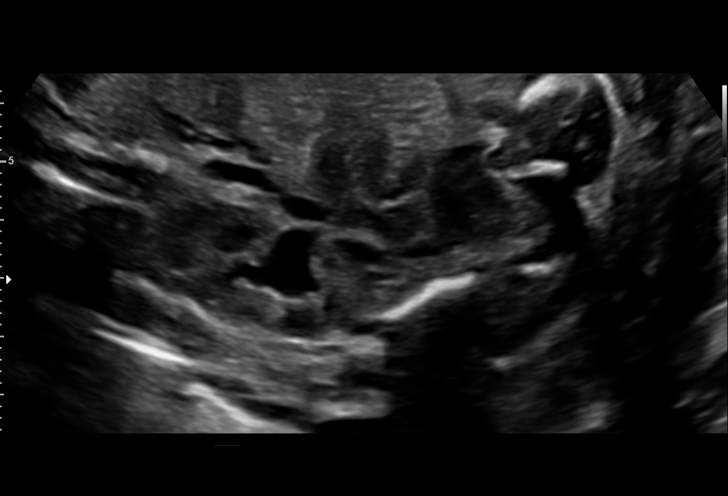
[im 68/92]
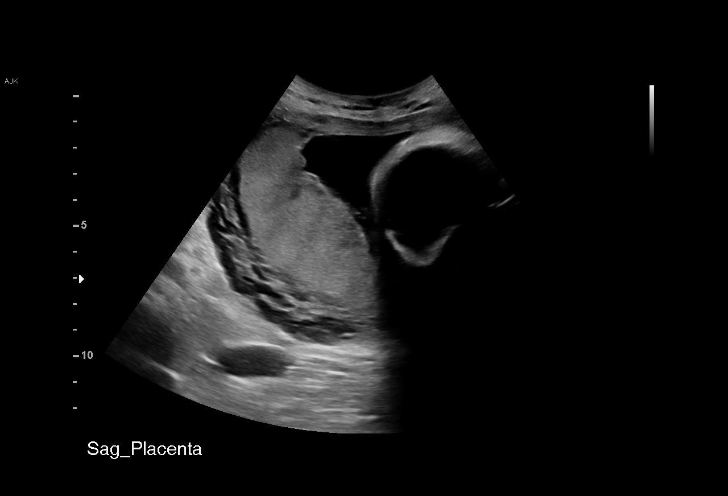
[im 75/92]
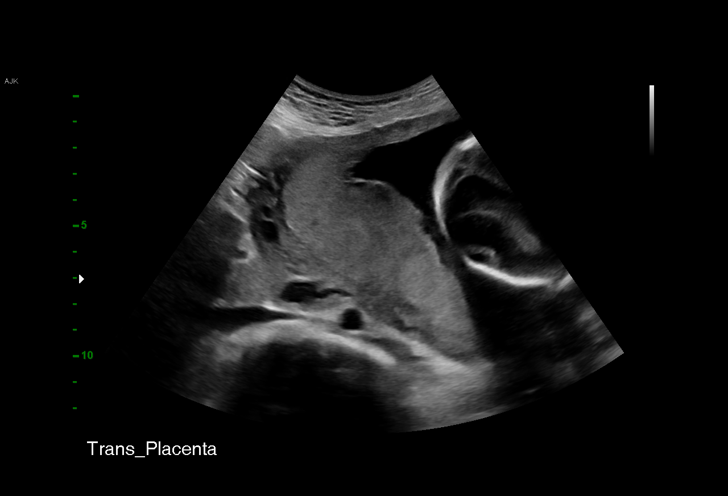
[im 81/92]
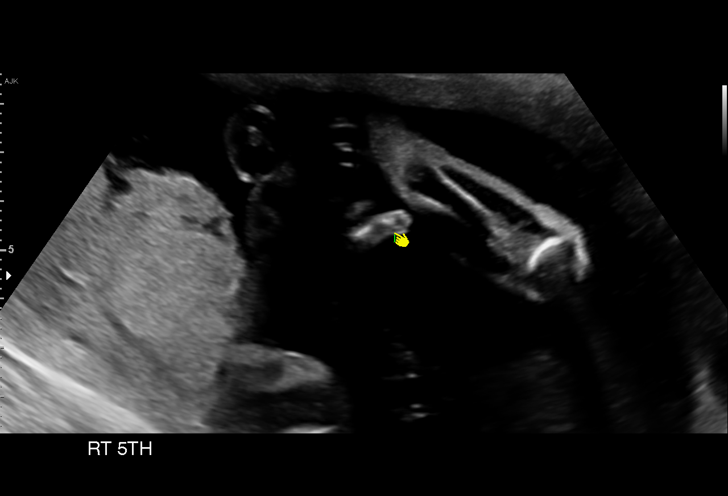
[im 88/92]
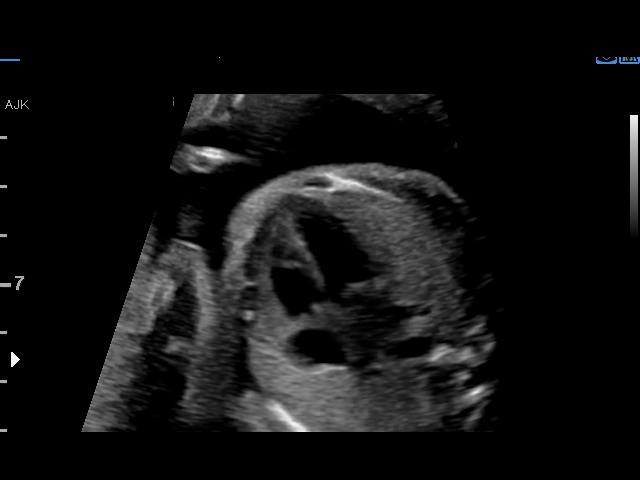

[13 of 28 positions shown; findings below may reference images not displayed]

Indications

 25 weeks gestation of pregnancy
 Encounter for uncertain dates
 Previous cesarean delivery, antepartum
 Previous child with congenital anomaly,
 antepartum (trasnposition great vessels)
 Encounter for antenatal screening for
 malformations (low risk nips, neg horizon)
Fetal Evaluation

 Num Of Fetuses:          1
 Fetal Heart Rate(bpm):   147
 Cardiac Activity:        Observed
 Presentation:            Breech
 Placenta:                Posterior
 P. Cord Insertion:       Visualized, central

 Amniotic Fluid
 AFI FV:      Within normal limits

                             Largest Pocket(cm)

Biometry

 BPD:      61.9   mm     G. Age:  25w 1d         26  %    CI:         71.45  %    70 - 86
                                                          FL/HC:       19.7  %    18.6 -
 HC:      233.2   mm     G. Age:  25w 3d         20  %    HC/AC:       1.05       1.04 -
 AC:      221.6   mm     G. Age:  26w 4d         73  %    FL/BPD:      74.3  %    71 - 87
 FL:         46   mm     G. Age:  25w 2d         27  %    FL/AC:       20.8  %    20 - 24
 HUM:      43.2   mm     G. Age:  25w 5d         51  %
 CER:        29   mm     G. Age:  26w 0d         55  %

 LV:         5.1  mm
 CM:         5.4  mm

 Est. FW:     869   gm    1 lb 15 oz      55  %
OB History

 Gravidity:     2         Term:  1
Gestational Age

 LMP:            22w 6d       Date:  12/26/18                   EDD:  10/02/19
 U/S Today:      25w 4d                                         EDD:  09/13/19
 Best:           25w 4d    Det. By:  U/S (06/04/19)             EDD:  09/13/19
Anatomy

 Cranium:                Appears normal         Aortic Arch:            Appears normal
 Cavum:                  Appears normal         Ductal Arch:            Not well visualized
 Ventricles:             Appears normal         Diaphragm:              Appears normal
 Choroid Plexus:         Appears normal         Stomach:                Appears normal, left
                                                                        sided
 Cerebellum:             Appears normal         Abdomen:                Appears normal
 Posterior Fossa:        Appears normal         Abdominal Wall:         Appears nml (cord
                                                                        insert, abd wall)
 Nuchal Fold:            Not applicable (>20    Cord Vessels:           Appears normal (3
                         wks GA)                                        vessel cord)
 Face:                   Appears normal         Kidneys:                Appear normal
                         (orbits and profile)
 Lips:                   Appears normal         Bladder:                Appears normal
 Thoracic:               Appears normal         Spine:                  Appears normal
 Heart:                  Appears normal         Upper Extremities:      Appears normal
                         (4CH, axis, and situs)
 RVOT:                   Appears normal         Lower Extremities:      Appears normal
 LVOT:                   Appears normal

 Other:   Parents do not wish to know sex of fetus. Heels and 5th digit visualized.
          Nasal bone visualized.
Cervix Uterus Adnexa

 Cervix
 Not visualized (advanced GA >11wks)

 Uterus
 No abnormality visualized.

 Right Ovary
 Within normal limits.

 Left Ovary
 Within normal limits.
 Adnexa
 No abnormality visualized.
Impression

 Normal anatomy with measurements not consistent with dates,
 therefore we have dated the pregnancy by today's ultrasound
 Suboptimal views of the fetal anatomy was obtained secondary
 to fetal position
 Good fetal movement and amniotic fluid observed.

 Lastly, Ms. Tiger has a prior pregnancy with a congenital
 heart defect. I discussed with Ms. Tiger the increased
 recurrence of 2-5% of a congenital heart defect in this
 pregnancy. Therefore, I recommended a fetal echocardiogram
 at the first available appointment, a referral was sent. I also
 reiterated that we did not observe any anomalies during this
 exam. I communicated her new due date of 09/13/19
Recommendations

 Follow up growth in 4 weeks
 Fetal echocardiogram sent.

## 2020-11-23 DIAGNOSIS — Z419 Encounter for procedure for purposes other than remedying health state, unspecified: Secondary | ICD-10-CM | POA: Diagnosis not present

## 2020-12-23 DIAGNOSIS — Z419 Encounter for procedure for purposes other than remedying health state, unspecified: Secondary | ICD-10-CM | POA: Diagnosis not present

## 2021-01-04 DIAGNOSIS — L02413 Cutaneous abscess of right upper limb: Secondary | ICD-10-CM | POA: Diagnosis not present

## 2021-01-04 DIAGNOSIS — R000 Tachycardia, unspecified: Secondary | ICD-10-CM | POA: Diagnosis not present

## 2021-01-23 DIAGNOSIS — Z419 Encounter for procedure for purposes other than remedying health state, unspecified: Secondary | ICD-10-CM | POA: Diagnosis not present

## 2021-02-01 IMAGING — US US MFM OB LIMITED
1 series · 14 of 18 positions shown · non-contrast
Comparison: none

[Series 1: us mfm ob limited · 18 acquisitions, 14 frames shown]
[im 1/18]
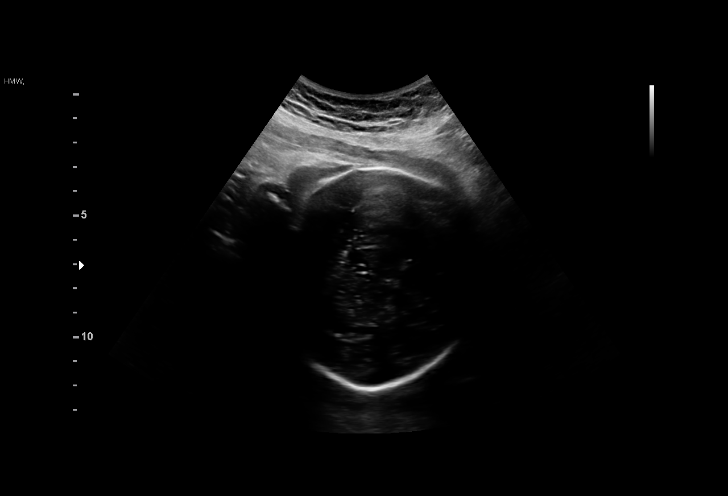
[im 2/18]
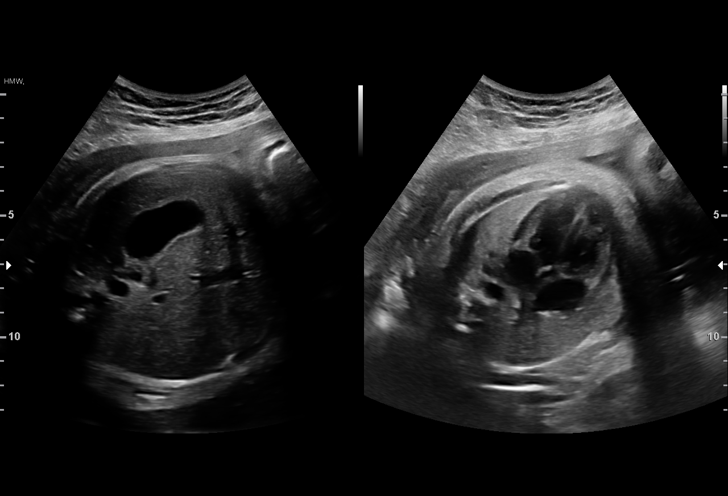
[im 4/18]
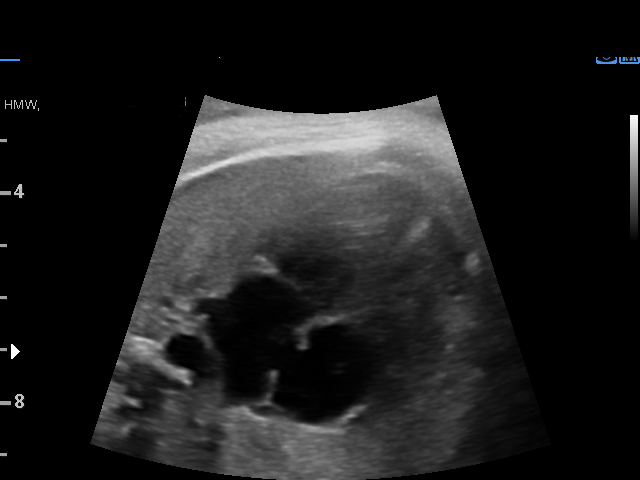
[im 5/18]
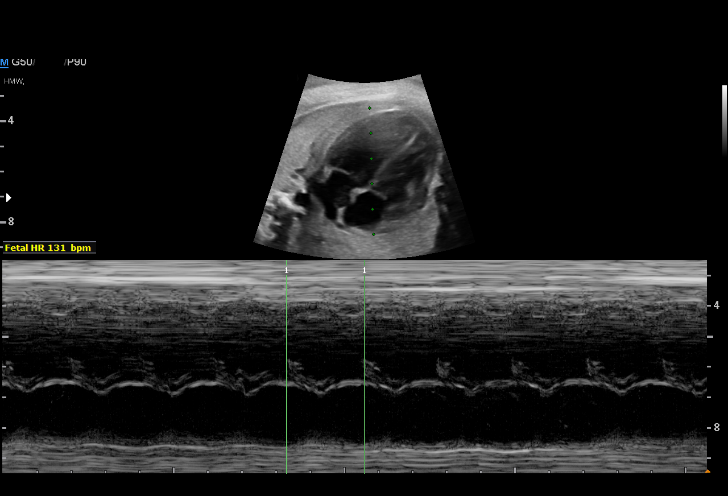
[im 6/18]
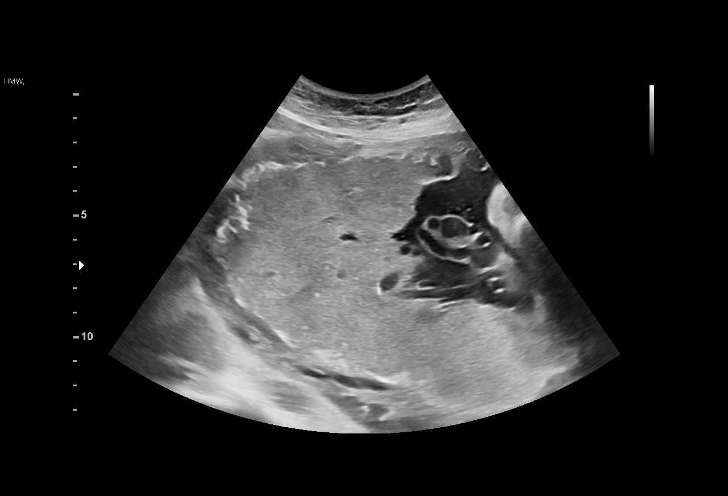
[im 8/18]
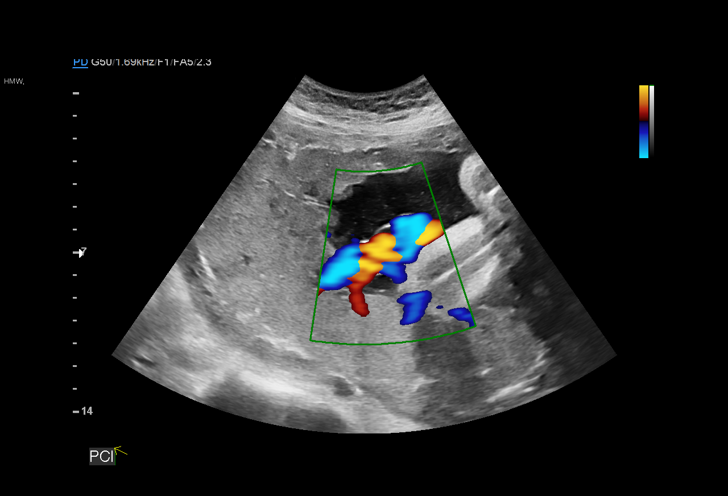
[im 9/18]
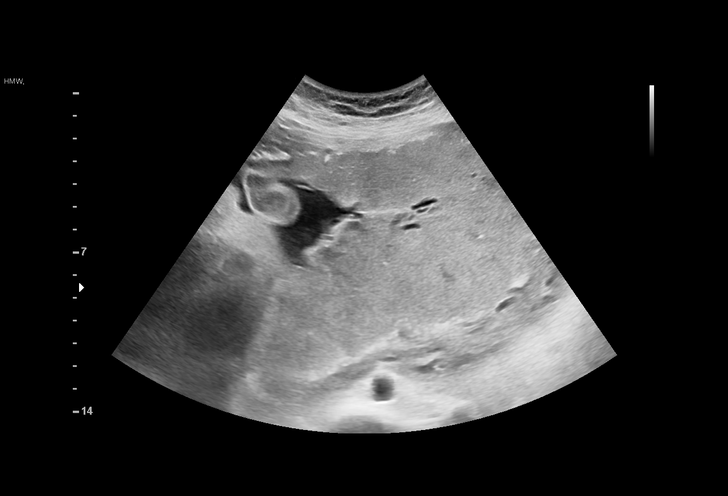
[im 10/18]
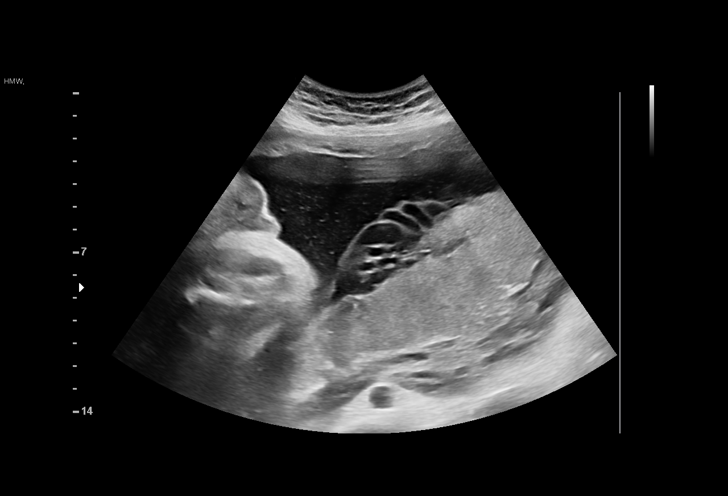
[im 11/18]
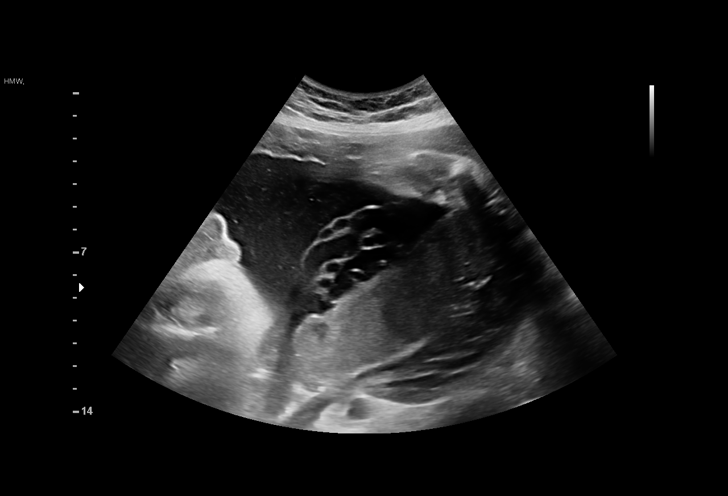
[im 13/18]
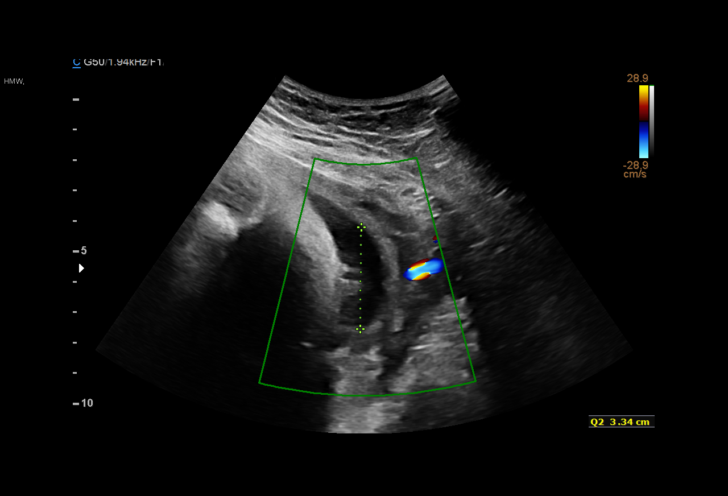
[im 14/18]
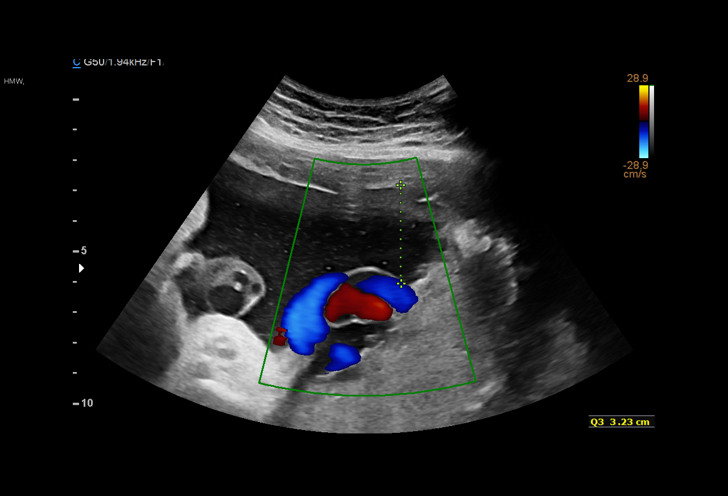
[im 15/18]
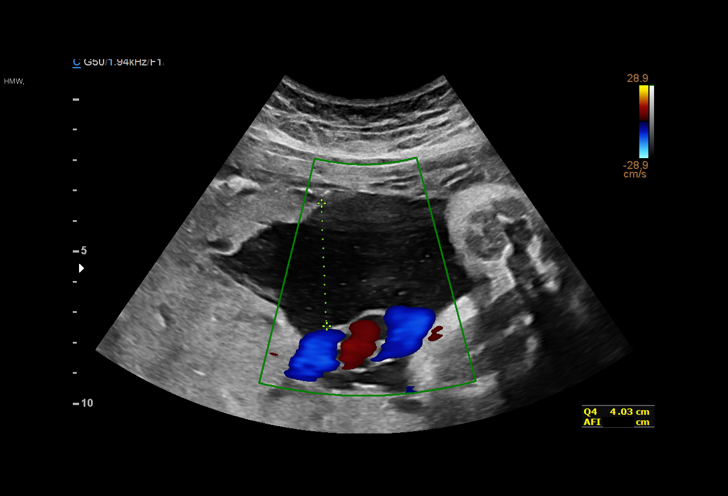
[im 17/18]
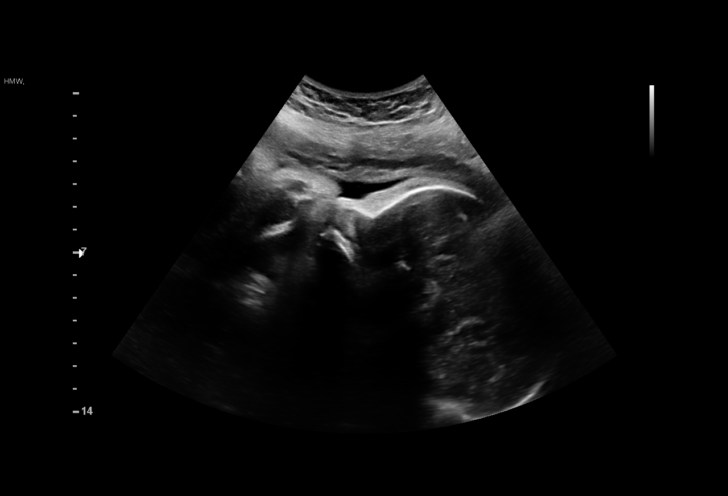
[im 18/18]
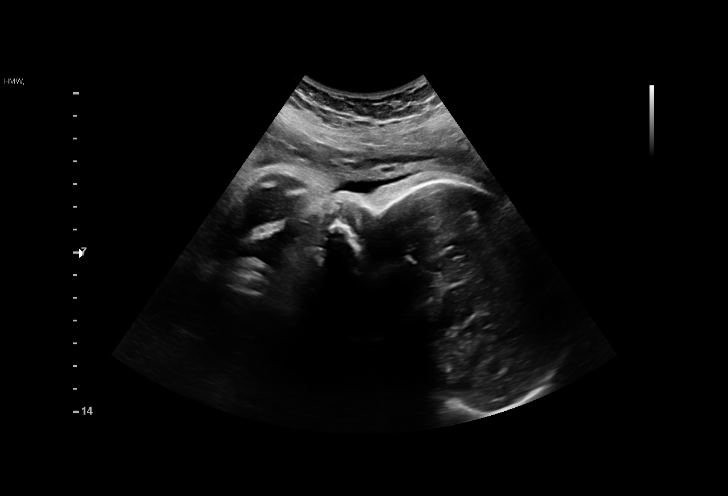

[14 of 18 positions shown; findings below may reference images not displayed]

1  US MFM OB LIMITED                     76815.01    Tiger DAMIANI

Indications

 36 weeks gestation of pregnancy
 Previous cesarean delivery, antepartum
 Previous child with congenital anomaly,
 antepartum (trasnposition great vessels)
 Encounter for other antenatal screening
 follow-up
 Abnormal fetal presentation
Fetal Evaluation

 Num Of Fetuses:          1
 Fetal Heart Rate(bpm):   131
 Cardiac Activity:        Observed
 Presentation:            Cephalic
 Placenta:                Posterior
 P. Cord Insertion:       Visualized, central

 Amniotic Fluid
 AFI FV:      Within normal limits

 AFI Sum(cm)     %Tile       Largest Pocket(cm)
 14.3            53          4

 RUQ(cm)       RLQ(cm)       LUQ(cm)        LLQ(cm)
 3.8           4
OB History
 Gravidity:    2         Term:   1
Gestational Age

 LMP:           33w 5d        Date:  12/26/18                 EDD:   10/02/19
 Best:          36w 3d     Det. By:  U/S  (06/04/19)          EDD:   09/13/19
Anatomy

 Cranium:               Appears normal         Aortic Arch:            Previously seen
 Cavum:                 Previously seen        Ductal Arch:            Previously seen
 Ventricles:            Appears normal         Diaphragm:              Previously seen
 Choroid Plexus:        Previously seen        Stomach:                Appears normal, left
                                                                       sided
 Cerebellum:            Previously seen        Abdomen:                Previously seen
 Posterior Fossa:       Previously seen        Abdominal Wall:         Previously seen
 Nuchal Fold:           Not applicable (>20    Cord Vessels:           Previously seen
                        wks GA)
 Face:                  Orbits and profile     Kidneys:                Previously seen
                        previously seen
 Lips:                  Previously seen        Bladder:                Appears normal
 Thoracic:              Appears normal         Spine:                  Previously seen
 Heart:                 Appears normal         Upper Extremities:      Previously seen
                        (4CH, axis, and
                        situs)
 RVOT:                  Previously seen        Lower Extremities:      Previously seen
 LVOT:                  Previously seen

 Other:  Parents do not wish to know sex of fetus. Heels and 5th digit
         pevioulsy visualized. Nasal bone previously visualized.
Impression

 Limited exam to assess amniotic fluid volume and fetal
 presentation
 Normal amniotic fluid
 Cephalic presentation.
Recommendations

 Follow up as clinically indicated.

## 2021-02-23 DIAGNOSIS — Z419 Encounter for procedure for purposes other than remedying health state, unspecified: Secondary | ICD-10-CM | POA: Diagnosis not present

## 2021-03-23 DIAGNOSIS — Z419 Encounter for procedure for purposes other than remedying health state, unspecified: Secondary | ICD-10-CM | POA: Diagnosis not present

## 2021-04-23 DIAGNOSIS — Z419 Encounter for procedure for purposes other than remedying health state, unspecified: Secondary | ICD-10-CM | POA: Diagnosis not present

## 2021-05-23 DIAGNOSIS — Z419 Encounter for procedure for purposes other than remedying health state, unspecified: Secondary | ICD-10-CM | POA: Diagnosis not present

## 2021-06-19 DIAGNOSIS — K761 Chronic passive congestion of liver: Secondary | ICD-10-CM | POA: Diagnosis not present

## 2021-06-19 DIAGNOSIS — R160 Hepatomegaly, not elsewhere classified: Secondary | ICD-10-CM | POA: Diagnosis not present

## 2021-06-19 DIAGNOSIS — F32A Depression, unspecified: Secondary | ICD-10-CM | POA: Diagnosis not present

## 2021-06-19 DIAGNOSIS — N390 Urinary tract infection, site not specified: Secondary | ICD-10-CM | POA: Diagnosis not present

## 2021-06-19 DIAGNOSIS — A419 Sepsis, unspecified organism: Secondary | ICD-10-CM | POA: Diagnosis not present

## 2021-06-19 DIAGNOSIS — M60851 Other myositis, right thigh: Secondary | ICD-10-CM | POA: Diagnosis not present

## 2021-06-19 DIAGNOSIS — R278 Other lack of coordination: Secondary | ICD-10-CM | POA: Diagnosis not present

## 2021-06-19 DIAGNOSIS — K8000 Calculus of gallbladder with acute cholecystitis without obstruction: Secondary | ICD-10-CM | POA: Diagnosis not present

## 2021-06-19 DIAGNOSIS — I078 Other rheumatic tricuspid valve diseases: Secondary | ICD-10-CM | POA: Diagnosis not present

## 2021-06-19 DIAGNOSIS — B9562 Methicillin resistant Staphylococcus aureus infection as the cause of diseases classified elsewhere: Secondary | ICD-10-CM | POA: Diagnosis not present

## 2021-06-19 DIAGNOSIS — E860 Dehydration: Secondary | ICD-10-CM | POA: Diagnosis not present

## 2021-06-19 DIAGNOSIS — M25532 Pain in left wrist: Secondary | ICD-10-CM | POA: Diagnosis not present

## 2021-06-19 DIAGNOSIS — A4102 Sepsis due to Methicillin resistant Staphylococcus aureus: Secondary | ICD-10-CM | POA: Diagnosis not present

## 2021-06-19 DIAGNOSIS — R162 Hepatomegaly with splenomegaly, not elsewhere classified: Secondary | ICD-10-CM | POA: Diagnosis not present

## 2021-06-19 DIAGNOSIS — Z452 Encounter for adjustment and management of vascular access device: Secondary | ICD-10-CM | POA: Diagnosis not present

## 2021-06-19 DIAGNOSIS — R451 Restlessness and agitation: Secondary | ICD-10-CM | POA: Diagnosis not present

## 2021-06-19 DIAGNOSIS — R531 Weakness: Secondary | ICD-10-CM | POA: Diagnosis not present

## 2021-06-19 DIAGNOSIS — R6521 Severe sepsis with septic shock: Secondary | ICD-10-CM | POA: Diagnosis not present

## 2021-06-19 DIAGNOSIS — M79632 Pain in left forearm: Secondary | ICD-10-CM | POA: Diagnosis not present

## 2021-06-19 DIAGNOSIS — R918 Other nonspecific abnormal finding of lung field: Secondary | ICD-10-CM | POA: Diagnosis not present

## 2021-06-19 DIAGNOSIS — I330 Acute and subacute infective endocarditis: Secondary | ICD-10-CM | POA: Diagnosis not present

## 2021-06-19 DIAGNOSIS — K828 Other specified diseases of gallbladder: Secondary | ICD-10-CM | POA: Diagnosis not present

## 2021-06-19 DIAGNOSIS — I2690 Septic pulmonary embolism without acute cor pulmonale: Secondary | ICD-10-CM | POA: Diagnosis not present

## 2021-06-19 DIAGNOSIS — F1120 Opioid dependence, uncomplicated: Secondary | ICD-10-CM | POA: Diagnosis not present

## 2021-06-19 DIAGNOSIS — G9341 Metabolic encephalopathy: Secondary | ICD-10-CM | POA: Diagnosis not present

## 2021-06-19 DIAGNOSIS — N3289 Other specified disorders of bladder: Secondary | ICD-10-CM | POA: Diagnosis not present

## 2021-06-19 DIAGNOSIS — R4182 Altered mental status, unspecified: Secondary | ICD-10-CM | POA: Diagnosis not present

## 2021-06-19 DIAGNOSIS — F17210 Nicotine dependence, cigarettes, uncomplicated: Secondary | ICD-10-CM | POA: Diagnosis not present

## 2021-06-19 DIAGNOSIS — R45851 Suicidal ideations: Secondary | ICD-10-CM | POA: Diagnosis not present

## 2021-06-20 DIAGNOSIS — K8000 Calculus of gallbladder with acute cholecystitis without obstruction: Secondary | ICD-10-CM | POA: Diagnosis not present

## 2021-06-20 DIAGNOSIS — R160 Hepatomegaly, not elsewhere classified: Secondary | ICD-10-CM | POA: Diagnosis not present

## 2021-06-20 DIAGNOSIS — R45851 Suicidal ideations: Secondary | ICD-10-CM | POA: Diagnosis not present

## 2021-06-20 DIAGNOSIS — N3289 Other specified disorders of bladder: Secondary | ICD-10-CM | POA: Diagnosis not present

## 2021-06-20 DIAGNOSIS — R6521 Severe sepsis with septic shock: Secondary | ICD-10-CM | POA: Diagnosis not present

## 2021-06-20 DIAGNOSIS — R918 Other nonspecific abnormal finding of lung field: Secondary | ICD-10-CM | POA: Diagnosis not present

## 2021-06-20 DIAGNOSIS — K761 Chronic passive congestion of liver: Secondary | ICD-10-CM | POA: Diagnosis not present

## 2021-06-20 DIAGNOSIS — I330 Acute and subacute infective endocarditis: Secondary | ICD-10-CM | POA: Diagnosis not present

## 2021-06-20 DIAGNOSIS — Z452 Encounter for adjustment and management of vascular access device: Secondary | ICD-10-CM | POA: Diagnosis not present

## 2021-06-20 DIAGNOSIS — R4182 Altered mental status, unspecified: Secondary | ICD-10-CM | POA: Diagnosis not present

## 2021-06-20 DIAGNOSIS — R579 Shock, unspecified: Secondary | ICD-10-CM | POA: Diagnosis not present

## 2021-06-20 DIAGNOSIS — B958 Unspecified staphylococcus as the cause of diseases classified elsewhere: Secondary | ICD-10-CM | POA: Diagnosis not present

## 2021-06-20 DIAGNOSIS — R0602 Shortness of breath: Secondary | ICD-10-CM | POA: Diagnosis not present

## 2021-06-20 DIAGNOSIS — E860 Dehydration: Secondary | ICD-10-CM | POA: Diagnosis not present

## 2021-06-20 DIAGNOSIS — R162 Hepatomegaly with splenomegaly, not elsewhere classified: Secondary | ICD-10-CM | POA: Diagnosis not present

## 2021-06-20 DIAGNOSIS — A419 Sepsis, unspecified organism: Secondary | ICD-10-CM | POA: Diagnosis not present

## 2021-06-20 DIAGNOSIS — R7989 Other specified abnormal findings of blood chemistry: Secondary | ICD-10-CM | POA: Diagnosis not present

## 2021-06-20 DIAGNOSIS — K828 Other specified diseases of gallbladder: Secondary | ICD-10-CM | POA: Diagnosis not present

## 2021-06-21 DIAGNOSIS — I079 Rheumatic tricuspid valve disease, unspecified: Secondary | ICD-10-CM | POA: Diagnosis not present

## 2021-06-21 DIAGNOSIS — B958 Unspecified staphylococcus as the cause of diseases classified elsewhere: Secondary | ICD-10-CM | POA: Diagnosis not present

## 2021-06-21 DIAGNOSIS — A419 Sepsis, unspecified organism: Secondary | ICD-10-CM | POA: Diagnosis not present

## 2021-06-21 DIAGNOSIS — R6521 Severe sepsis with septic shock: Secondary | ICD-10-CM | POA: Diagnosis not present

## 2021-06-21 DIAGNOSIS — R45851 Suicidal ideations: Secondary | ICD-10-CM | POA: Diagnosis not present

## 2021-06-21 DIAGNOSIS — A4102 Sepsis due to Methicillin resistant Staphylococcus aureus: Secondary | ICD-10-CM | POA: Diagnosis not present

## 2021-06-21 DIAGNOSIS — K810 Acute cholecystitis: Secondary | ICD-10-CM | POA: Diagnosis not present

## 2021-06-21 DIAGNOSIS — B9562 Methicillin resistant Staphylococcus aureus infection as the cause of diseases classified elsewhere: Secondary | ICD-10-CM | POA: Diagnosis not present

## 2021-06-21 DIAGNOSIS — K651 Peritoneal abscess: Secondary | ICD-10-CM | POA: Diagnosis not present

## 2021-06-21 DIAGNOSIS — E860 Dehydration: Secondary | ICD-10-CM | POA: Diagnosis not present

## 2021-06-21 DIAGNOSIS — I081 Rheumatic disorders of both mitral and tricuspid valves: Secondary | ICD-10-CM | POA: Diagnosis not present

## 2021-06-21 DIAGNOSIS — I2690 Septic pulmonary embolism without acute cor pulmonale: Secondary | ICD-10-CM | POA: Diagnosis not present

## 2021-06-21 DIAGNOSIS — I330 Acute and subacute infective endocarditis: Secondary | ICD-10-CM | POA: Diagnosis not present

## 2021-06-21 DIAGNOSIS — R931 Abnormal findings on diagnostic imaging of heart and coronary circulation: Secondary | ICD-10-CM | POA: Diagnosis not present

## 2021-06-21 DIAGNOSIS — N179 Acute kidney failure, unspecified: Secondary | ICD-10-CM | POA: Diagnosis not present

## 2021-06-22 DIAGNOSIS — B958 Unspecified staphylococcus as the cause of diseases classified elsewhere: Secondary | ICD-10-CM | POA: Diagnosis not present

## 2021-06-22 DIAGNOSIS — A4102 Sepsis due to Methicillin resistant Staphylococcus aureus: Secondary | ICD-10-CM | POA: Diagnosis not present

## 2021-06-22 DIAGNOSIS — R6521 Severe sepsis with septic shock: Secondary | ICD-10-CM | POA: Diagnosis not present

## 2021-06-22 DIAGNOSIS — I2690 Septic pulmonary embolism without acute cor pulmonale: Secondary | ICD-10-CM | POA: Diagnosis not present

## 2021-06-22 DIAGNOSIS — I079 Rheumatic tricuspid valve disease, unspecified: Secondary | ICD-10-CM | POA: Diagnosis not present

## 2021-06-22 DIAGNOSIS — A419 Sepsis, unspecified organism: Secondary | ICD-10-CM | POA: Diagnosis not present

## 2021-06-22 DIAGNOSIS — R188 Other ascites: Secondary | ICD-10-CM | POA: Diagnosis not present

## 2021-06-22 DIAGNOSIS — E860 Dehydration: Secondary | ICD-10-CM | POA: Diagnosis not present

## 2021-06-22 DIAGNOSIS — B9562 Methicillin resistant Staphylococcus aureus infection as the cause of diseases classified elsewhere: Secondary | ICD-10-CM | POA: Diagnosis not present

## 2021-06-22 DIAGNOSIS — R45851 Suicidal ideations: Secondary | ICD-10-CM | POA: Diagnosis not present

## 2021-06-22 DIAGNOSIS — I330 Acute and subacute infective endocarditis: Secondary | ICD-10-CM | POA: Diagnosis not present

## 2021-06-22 DIAGNOSIS — N179 Acute kidney failure, unspecified: Secondary | ICD-10-CM | POA: Diagnosis not present

## 2021-06-22 DIAGNOSIS — K810 Acute cholecystitis: Secondary | ICD-10-CM | POA: Diagnosis not present

## 2021-06-23 DIAGNOSIS — N179 Acute kidney failure, unspecified: Secondary | ICD-10-CM | POA: Diagnosis not present

## 2021-06-23 DIAGNOSIS — R918 Other nonspecific abnormal finding of lung field: Secondary | ICD-10-CM | POA: Diagnosis not present

## 2021-06-23 DIAGNOSIS — B958 Unspecified staphylococcus as the cause of diseases classified elsewhere: Secondary | ICD-10-CM | POA: Diagnosis not present

## 2021-06-23 DIAGNOSIS — R0602 Shortness of breath: Secondary | ICD-10-CM | POA: Diagnosis not present

## 2021-06-23 DIAGNOSIS — I330 Acute and subacute infective endocarditis: Secondary | ICD-10-CM | POA: Diagnosis not present

## 2021-06-23 DIAGNOSIS — R6521 Severe sepsis with septic shock: Secondary | ICD-10-CM | POA: Diagnosis not present

## 2021-06-23 DIAGNOSIS — A419 Sepsis, unspecified organism: Secondary | ICD-10-CM | POA: Diagnosis not present

## 2021-06-23 DIAGNOSIS — R45851 Suicidal ideations: Secondary | ICD-10-CM | POA: Diagnosis not present

## 2021-06-23 DIAGNOSIS — B9562 Methicillin resistant Staphylococcus aureus infection as the cause of diseases classified elsewhere: Secondary | ICD-10-CM | POA: Diagnosis not present

## 2021-06-23 DIAGNOSIS — I2690 Septic pulmonary embolism without acute cor pulmonale: Secondary | ICD-10-CM | POA: Diagnosis not present

## 2021-06-23 DIAGNOSIS — K810 Acute cholecystitis: Secondary | ICD-10-CM | POA: Diagnosis not present

## 2021-06-23 DIAGNOSIS — E860 Dehydration: Secondary | ICD-10-CM | POA: Diagnosis not present

## 2021-06-23 DIAGNOSIS — I079 Rheumatic tricuspid valve disease, unspecified: Secondary | ICD-10-CM | POA: Diagnosis not present

## 2021-06-23 DIAGNOSIS — A4102 Sepsis due to Methicillin resistant Staphylococcus aureus: Secondary | ICD-10-CM | POA: Diagnosis not present

## 2021-06-23 DIAGNOSIS — Z419 Encounter for procedure for purposes other than remedying health state, unspecified: Secondary | ICD-10-CM | POA: Diagnosis not present

## 2021-06-24 DIAGNOSIS — R45851 Suicidal ideations: Secondary | ICD-10-CM | POA: Diagnosis not present

## 2021-06-24 DIAGNOSIS — B9562 Methicillin resistant Staphylococcus aureus infection as the cause of diseases classified elsewhere: Secondary | ICD-10-CM | POA: Diagnosis not present

## 2021-06-24 DIAGNOSIS — I079 Rheumatic tricuspid valve disease, unspecified: Secondary | ICD-10-CM | POA: Diagnosis not present

## 2021-06-24 DIAGNOSIS — E860 Dehydration: Secondary | ICD-10-CM | POA: Diagnosis not present

## 2021-06-24 DIAGNOSIS — N179 Acute kidney failure, unspecified: Secondary | ICD-10-CM | POA: Diagnosis not present

## 2021-06-24 DIAGNOSIS — I2690 Septic pulmonary embolism without acute cor pulmonale: Secondary | ICD-10-CM | POA: Diagnosis not present

## 2021-06-24 DIAGNOSIS — K810 Acute cholecystitis: Secondary | ICD-10-CM | POA: Diagnosis not present

## 2021-06-24 DIAGNOSIS — I330 Acute and subacute infective endocarditis: Secondary | ICD-10-CM | POA: Diagnosis not present

## 2021-06-24 DIAGNOSIS — R6521 Severe sepsis with septic shock: Secondary | ICD-10-CM | POA: Diagnosis not present

## 2021-06-24 DIAGNOSIS — A419 Sepsis, unspecified organism: Secondary | ICD-10-CM | POA: Diagnosis not present

## 2021-06-24 DIAGNOSIS — B958 Unspecified staphylococcus as the cause of diseases classified elsewhere: Secondary | ICD-10-CM | POA: Diagnosis not present

## 2021-06-24 DIAGNOSIS — A4102 Sepsis due to Methicillin resistant Staphylococcus aureus: Secondary | ICD-10-CM | POA: Diagnosis not present

## 2021-06-25 DIAGNOSIS — B958 Unspecified staphylococcus as the cause of diseases classified elsewhere: Secondary | ICD-10-CM | POA: Diagnosis not present

## 2021-06-25 DIAGNOSIS — E860 Dehydration: Secondary | ICD-10-CM | POA: Diagnosis not present

## 2021-06-25 DIAGNOSIS — R45851 Suicidal ideations: Secondary | ICD-10-CM | POA: Diagnosis not present

## 2021-06-25 DIAGNOSIS — I330 Acute and subacute infective endocarditis: Secondary | ICD-10-CM | POA: Diagnosis not present

## 2021-06-25 DIAGNOSIS — A419 Sepsis, unspecified organism: Secondary | ICD-10-CM | POA: Diagnosis not present

## 2021-06-25 DIAGNOSIS — R6521 Severe sepsis with septic shock: Secondary | ICD-10-CM | POA: Diagnosis not present

## 2021-06-25 DIAGNOSIS — R600 Localized edema: Secondary | ICD-10-CM | POA: Diagnosis not present

## 2021-06-26 DIAGNOSIS — A4102 Sepsis due to Methicillin resistant Staphylococcus aureus: Secondary | ICD-10-CM | POA: Diagnosis not present

## 2021-06-26 DIAGNOSIS — N390 Urinary tract infection, site not specified: Secondary | ICD-10-CM | POA: Diagnosis not present

## 2021-06-26 DIAGNOSIS — N179 Acute kidney failure, unspecified: Secondary | ICD-10-CM | POA: Diagnosis not present

## 2021-06-26 DIAGNOSIS — E43 Unspecified severe protein-calorie malnutrition: Secondary | ICD-10-CM | POA: Diagnosis not present

## 2021-06-26 DIAGNOSIS — E860 Dehydration: Secondary | ICD-10-CM | POA: Diagnosis not present

## 2021-06-26 DIAGNOSIS — B958 Unspecified staphylococcus as the cause of diseases classified elsewhere: Secondary | ICD-10-CM | POA: Diagnosis not present

## 2021-06-26 DIAGNOSIS — E871 Hypo-osmolality and hyponatremia: Secondary | ICD-10-CM | POA: Diagnosis not present

## 2021-06-26 DIAGNOSIS — R64 Cachexia: Secondary | ICD-10-CM | POA: Diagnosis not present

## 2021-06-26 DIAGNOSIS — B9562 Methicillin resistant Staphylococcus aureus infection as the cause of diseases classified elsewhere: Secondary | ICD-10-CM | POA: Diagnosis not present

## 2021-06-26 DIAGNOSIS — R6521 Severe sepsis with septic shock: Secondary | ICD-10-CM | POA: Diagnosis not present

## 2021-06-26 DIAGNOSIS — G9341 Metabolic encephalopathy: Secondary | ICD-10-CM | POA: Diagnosis not present

## 2021-06-26 DIAGNOSIS — R519 Headache, unspecified: Secondary | ICD-10-CM | POA: Diagnosis not present

## 2021-06-26 DIAGNOSIS — F11288 Opioid dependence with other opioid-induced disorder: Secondary | ICD-10-CM | POA: Diagnosis not present

## 2021-06-26 DIAGNOSIS — H538 Other visual disturbances: Secondary | ICD-10-CM | POA: Diagnosis not present

## 2021-06-26 DIAGNOSIS — A419 Sepsis, unspecified organism: Secondary | ICD-10-CM | POA: Diagnosis not present

## 2021-06-26 DIAGNOSIS — H532 Diplopia: Secondary | ICD-10-CM | POA: Diagnosis not present

## 2021-06-26 DIAGNOSIS — I330 Acute and subacute infective endocarditis: Secondary | ICD-10-CM | POA: Diagnosis not present

## 2021-06-26 DIAGNOSIS — R45851 Suicidal ideations: Secondary | ICD-10-CM | POA: Diagnosis not present

## 2021-06-26 DIAGNOSIS — I959 Hypotension, unspecified: Secondary | ICD-10-CM | POA: Diagnosis not present

## 2021-06-26 DIAGNOSIS — R29818 Other symptoms and signs involving the nervous system: Secondary | ICD-10-CM | POA: Diagnosis not present

## 2021-06-26 DIAGNOSIS — K810 Acute cholecystitis: Secondary | ICD-10-CM | POA: Diagnosis not present

## 2021-06-26 DIAGNOSIS — I2690 Septic pulmonary embolism without acute cor pulmonale: Secondary | ICD-10-CM | POA: Diagnosis not present

## 2021-06-26 DIAGNOSIS — R531 Weakness: Secondary | ICD-10-CM | POA: Diagnosis not present

## 2021-06-26 DIAGNOSIS — I4729 Other ventricular tachycardia: Secondary | ICD-10-CM | POA: Diagnosis not present

## 2021-06-27 ENCOUNTER — Telehealth: Payer: Self-pay

## 2021-06-27 DIAGNOSIS — R6521 Severe sepsis with septic shock: Secondary | ICD-10-CM | POA: Diagnosis not present

## 2021-06-27 DIAGNOSIS — A4102 Sepsis due to Methicillin resistant Staphylococcus aureus: Secondary | ICD-10-CM | POA: Diagnosis not present

## 2021-06-27 DIAGNOSIS — A419 Sepsis, unspecified organism: Secondary | ICD-10-CM | POA: Diagnosis not present

## 2021-06-27 DIAGNOSIS — I330 Acute and subacute infective endocarditis: Secondary | ICD-10-CM | POA: Diagnosis not present

## 2021-06-27 NOTE — Telephone Encounter (Signed)
Transition Care Management Unsuccessful Follow-up Telephone Call  Date of discharge and from where:  06/26/2021 from [facility]   Attempts:  1st Attempt  Reason for unsuccessful TCM follow-up call:  Missing or invalid number

## 2021-06-28 DIAGNOSIS — A419 Sepsis, unspecified organism: Secondary | ICD-10-CM | POA: Diagnosis not present

## 2021-06-28 DIAGNOSIS — A4102 Sepsis due to Methicillin resistant Staphylococcus aureus: Secondary | ICD-10-CM | POA: Diagnosis not present

## 2021-06-28 DIAGNOSIS — R6521 Severe sepsis with septic shock: Secondary | ICD-10-CM | POA: Diagnosis not present

## 2021-06-28 DIAGNOSIS — I330 Acute and subacute infective endocarditis: Secondary | ICD-10-CM | POA: Diagnosis not present

## 2021-06-28 NOTE — Telephone Encounter (Signed)
Transition Care Management Unsuccessful Follow-up Telephone Call  Date of discharge and from where:  06/26/2021 from [facility]   Attempts:  2nd Attempt  Reason for unsuccessful TCM follow-up call:  Missing or invalid number

## 2021-06-29 DIAGNOSIS — A419 Sepsis, unspecified organism: Secondary | ICD-10-CM | POA: Diagnosis not present

## 2021-06-29 DIAGNOSIS — A4102 Sepsis due to Methicillin resistant Staphylococcus aureus: Secondary | ICD-10-CM | POA: Diagnosis not present

## 2021-06-29 DIAGNOSIS — I330 Acute and subacute infective endocarditis: Secondary | ICD-10-CM | POA: Diagnosis not present

## 2021-06-29 DIAGNOSIS — R6521 Severe sepsis with septic shock: Secondary | ICD-10-CM | POA: Diagnosis not present

## 2021-06-29 NOTE — Telephone Encounter (Signed)
Transition Care Management Unsuccessful Follow-up Telephone Call  Date of discharge and from where:  06/26/2021 from [facility]   Attempts:  3rd Attempt  Reason for unsuccessful TCM follow-up call:  Unable to reach patient

## 2021-06-30 DIAGNOSIS — F32A Depression, unspecified: Secondary | ICD-10-CM | POA: Diagnosis not present

## 2021-06-30 DIAGNOSIS — A419 Sepsis, unspecified organism: Secondary | ICD-10-CM | POA: Diagnosis not present

## 2021-06-30 DIAGNOSIS — G9341 Metabolic encephalopathy: Secondary | ICD-10-CM | POA: Diagnosis not present

## 2021-06-30 DIAGNOSIS — N390 Urinary tract infection, site not specified: Secondary | ICD-10-CM | POA: Diagnosis not present

## 2021-06-30 DIAGNOSIS — I330 Acute and subacute infective endocarditis: Secondary | ICD-10-CM | POA: Diagnosis not present

## 2021-06-30 DIAGNOSIS — R278 Other lack of coordination: Secondary | ICD-10-CM | POA: Diagnosis not present

## 2021-06-30 DIAGNOSIS — R7881 Bacteremia: Secondary | ICD-10-CM | POA: Diagnosis not present

## 2021-06-30 DIAGNOSIS — R6521 Severe sepsis with septic shock: Secondary | ICD-10-CM | POA: Diagnosis not present

## 2021-06-30 DIAGNOSIS — B9562 Methicillin resistant Staphylococcus aureus infection as the cause of diseases classified elsewhere: Secondary | ICD-10-CM | POA: Diagnosis not present

## 2021-06-30 DIAGNOSIS — I2690 Septic pulmonary embolism without acute cor pulmonale: Secondary | ICD-10-CM | POA: Diagnosis not present

## 2021-06-30 DIAGNOSIS — A4102 Sepsis due to Methicillin resistant Staphylococcus aureus: Secondary | ICD-10-CM | POA: Diagnosis not present

## 2021-06-30 DIAGNOSIS — I071 Rheumatic tricuspid insufficiency: Secondary | ICD-10-CM | POA: Diagnosis not present

## 2021-06-30 DIAGNOSIS — I078 Other rheumatic tricuspid valve diseases: Secondary | ICD-10-CM | POA: Diagnosis not present

## 2021-07-01 DIAGNOSIS — R6521 Severe sepsis with septic shock: Secondary | ICD-10-CM | POA: Diagnosis not present

## 2021-07-01 DIAGNOSIS — A419 Sepsis, unspecified organism: Secondary | ICD-10-CM | POA: Diagnosis not present

## 2021-07-01 DIAGNOSIS — I330 Acute and subacute infective endocarditis: Secondary | ICD-10-CM | POA: Diagnosis not present

## 2021-07-01 DIAGNOSIS — A4102 Sepsis due to Methicillin resistant Staphylococcus aureus: Secondary | ICD-10-CM | POA: Diagnosis not present

## 2021-07-02 DIAGNOSIS — A419 Sepsis, unspecified organism: Secondary | ICD-10-CM | POA: Diagnosis not present

## 2021-07-02 DIAGNOSIS — A4102 Sepsis due to Methicillin resistant Staphylococcus aureus: Secondary | ICD-10-CM | POA: Diagnosis not present

## 2021-07-02 DIAGNOSIS — I330 Acute and subacute infective endocarditis: Secondary | ICD-10-CM | POA: Diagnosis not present

## 2021-07-02 DIAGNOSIS — R6521 Severe sepsis with septic shock: Secondary | ICD-10-CM | POA: Diagnosis not present

## 2021-07-03 DIAGNOSIS — I330 Acute and subacute infective endocarditis: Secondary | ICD-10-CM | POA: Diagnosis not present

## 2021-07-03 DIAGNOSIS — R6521 Severe sepsis with septic shock: Secondary | ICD-10-CM | POA: Diagnosis not present

## 2021-07-03 DIAGNOSIS — A419 Sepsis, unspecified organism: Secondary | ICD-10-CM | POA: Diagnosis not present

## 2021-07-03 DIAGNOSIS — A4102 Sepsis due to Methicillin resistant Staphylococcus aureus: Secondary | ICD-10-CM | POA: Diagnosis not present

## 2021-07-04 DIAGNOSIS — A4102 Sepsis due to Methicillin resistant Staphylococcus aureus: Secondary | ICD-10-CM | POA: Diagnosis not present

## 2021-07-04 DIAGNOSIS — G9341 Metabolic encephalopathy: Secondary | ICD-10-CM | POA: Diagnosis not present

## 2021-07-04 DIAGNOSIS — I2690 Septic pulmonary embolism without acute cor pulmonale: Secondary | ICD-10-CM | POA: Diagnosis not present

## 2021-07-04 DIAGNOSIS — B9562 Methicillin resistant Staphylococcus aureus infection as the cause of diseases classified elsewhere: Secondary | ICD-10-CM | POA: Diagnosis not present

## 2021-07-04 DIAGNOSIS — I330 Acute and subacute infective endocarditis: Secondary | ICD-10-CM | POA: Diagnosis not present

## 2021-07-04 DIAGNOSIS — R6521 Severe sepsis with septic shock: Secondary | ICD-10-CM | POA: Diagnosis not present

## 2021-07-04 DIAGNOSIS — R7881 Bacteremia: Secondary | ICD-10-CM | POA: Diagnosis not present

## 2021-07-05 DIAGNOSIS — R6521 Severe sepsis with septic shock: Secondary | ICD-10-CM | POA: Diagnosis not present

## 2021-07-05 DIAGNOSIS — F32A Depression, unspecified: Secondary | ICD-10-CM | POA: Diagnosis not present

## 2021-07-05 DIAGNOSIS — I2690 Septic pulmonary embolism without acute cor pulmonale: Secondary | ICD-10-CM | POA: Diagnosis not present

## 2021-07-05 DIAGNOSIS — B9562 Methicillin resistant Staphylococcus aureus infection as the cause of diseases classified elsewhere: Secondary | ICD-10-CM | POA: Diagnosis not present

## 2021-07-05 DIAGNOSIS — I330 Acute and subacute infective endocarditis: Secondary | ICD-10-CM | POA: Diagnosis not present

## 2021-07-05 DIAGNOSIS — R918 Other nonspecific abnormal finding of lung field: Secondary | ICD-10-CM | POA: Diagnosis not present

## 2021-07-05 DIAGNOSIS — G9341 Metabolic encephalopathy: Secondary | ICD-10-CM | POA: Diagnosis not present

## 2021-07-05 DIAGNOSIS — J189 Pneumonia, unspecified organism: Secondary | ICD-10-CM | POA: Diagnosis not present

## 2021-07-05 DIAGNOSIS — A4102 Sepsis due to Methicillin resistant Staphylococcus aureus: Secondary | ICD-10-CM | POA: Diagnosis not present

## 2021-07-06 DIAGNOSIS — I2690 Septic pulmonary embolism without acute cor pulmonale: Secondary | ICD-10-CM | POA: Diagnosis not present

## 2021-07-06 DIAGNOSIS — B9562 Methicillin resistant Staphylococcus aureus infection as the cause of diseases classified elsewhere: Secondary | ICD-10-CM | POA: Diagnosis not present

## 2021-07-06 DIAGNOSIS — F32A Depression, unspecified: Secondary | ICD-10-CM | POA: Diagnosis not present

## 2021-07-06 DIAGNOSIS — N390 Urinary tract infection, site not specified: Secondary | ICD-10-CM | POA: Diagnosis not present

## 2021-07-06 DIAGNOSIS — I330 Acute and subacute infective endocarditis: Secondary | ICD-10-CM | POA: Diagnosis not present

## 2021-07-06 DIAGNOSIS — A4102 Sepsis due to Methicillin resistant Staphylococcus aureus: Secondary | ICD-10-CM | POA: Diagnosis not present

## 2021-07-06 DIAGNOSIS — G9341 Metabolic encephalopathy: Secondary | ICD-10-CM | POA: Diagnosis not present

## 2021-07-06 DIAGNOSIS — R6521 Severe sepsis with septic shock: Secondary | ICD-10-CM | POA: Diagnosis not present

## 2021-07-06 DIAGNOSIS — R278 Other lack of coordination: Secondary | ICD-10-CM | POA: Diagnosis not present

## 2021-07-06 DIAGNOSIS — I078 Other rheumatic tricuspid valve diseases: Secondary | ICD-10-CM | POA: Diagnosis not present

## 2021-07-07 DIAGNOSIS — G9341 Metabolic encephalopathy: Secondary | ICD-10-CM | POA: Diagnosis not present

## 2021-07-07 DIAGNOSIS — I330 Acute and subacute infective endocarditis: Secondary | ICD-10-CM | POA: Diagnosis not present

## 2021-07-07 DIAGNOSIS — A4102 Sepsis due to Methicillin resistant Staphylococcus aureus: Secondary | ICD-10-CM | POA: Diagnosis not present

## 2021-07-07 DIAGNOSIS — I2690 Septic pulmonary embolism without acute cor pulmonale: Secondary | ICD-10-CM | POA: Diagnosis not present

## 2021-07-07 DIAGNOSIS — R6521 Severe sepsis with septic shock: Secondary | ICD-10-CM | POA: Diagnosis not present

## 2021-07-08 DIAGNOSIS — G9341 Metabolic encephalopathy: Secondary | ICD-10-CM | POA: Diagnosis not present

## 2021-07-08 DIAGNOSIS — B9562 Methicillin resistant Staphylococcus aureus infection as the cause of diseases classified elsewhere: Secondary | ICD-10-CM | POA: Diagnosis not present

## 2021-07-08 DIAGNOSIS — A4102 Sepsis due to Methicillin resistant Staphylococcus aureus: Secondary | ICD-10-CM | POA: Diagnosis not present

## 2021-07-08 DIAGNOSIS — R6521 Severe sepsis with septic shock: Secondary | ICD-10-CM | POA: Diagnosis not present

## 2021-07-08 DIAGNOSIS — I330 Acute and subacute infective endocarditis: Secondary | ICD-10-CM | POA: Diagnosis not present

## 2021-07-08 DIAGNOSIS — I2690 Septic pulmonary embolism without acute cor pulmonale: Secondary | ICD-10-CM | POA: Diagnosis not present

## 2021-07-09 DIAGNOSIS — G9341 Metabolic encephalopathy: Secondary | ICD-10-CM | POA: Diagnosis not present

## 2021-07-09 DIAGNOSIS — I330 Acute and subacute infective endocarditis: Secondary | ICD-10-CM | POA: Diagnosis not present

## 2021-07-09 DIAGNOSIS — R6521 Severe sepsis with septic shock: Secondary | ICD-10-CM | POA: Diagnosis not present

## 2021-07-09 DIAGNOSIS — A4102 Sepsis due to Methicillin resistant Staphylococcus aureus: Secondary | ICD-10-CM | POA: Diagnosis not present

## 2021-07-09 DIAGNOSIS — I2690 Septic pulmonary embolism without acute cor pulmonale: Secondary | ICD-10-CM | POA: Diagnosis not present

## 2021-07-10 DIAGNOSIS — G9341 Metabolic encephalopathy: Secondary | ICD-10-CM | POA: Diagnosis not present

## 2021-07-10 DIAGNOSIS — I330 Acute and subacute infective endocarditis: Secondary | ICD-10-CM | POA: Diagnosis not present

## 2021-07-10 DIAGNOSIS — A4102 Sepsis due to Methicillin resistant Staphylococcus aureus: Secondary | ICD-10-CM | POA: Diagnosis not present

## 2021-07-10 DIAGNOSIS — R6521 Severe sepsis with septic shock: Secondary | ICD-10-CM | POA: Diagnosis not present

## 2021-07-10 DIAGNOSIS — I2690 Septic pulmonary embolism without acute cor pulmonale: Secondary | ICD-10-CM | POA: Diagnosis not present

## 2021-07-11 DIAGNOSIS — B9562 Methicillin resistant Staphylococcus aureus infection as the cause of diseases classified elsewhere: Secondary | ICD-10-CM | POA: Diagnosis not present

## 2021-07-11 DIAGNOSIS — A4102 Sepsis due to Methicillin resistant Staphylococcus aureus: Secondary | ICD-10-CM | POA: Diagnosis not present

## 2021-07-11 DIAGNOSIS — R6521 Severe sepsis with septic shock: Secondary | ICD-10-CM | POA: Diagnosis not present

## 2021-07-11 DIAGNOSIS — I2690 Septic pulmonary embolism without acute cor pulmonale: Secondary | ICD-10-CM | POA: Diagnosis not present

## 2021-07-11 DIAGNOSIS — G9341 Metabolic encephalopathy: Secondary | ICD-10-CM | POA: Diagnosis not present

## 2021-07-11 DIAGNOSIS — I330 Acute and subacute infective endocarditis: Secondary | ICD-10-CM | POA: Diagnosis not present

## 2021-07-12 DIAGNOSIS — I330 Acute and subacute infective endocarditis: Secondary | ICD-10-CM | POA: Diagnosis not present

## 2021-07-12 DIAGNOSIS — G9341 Metabolic encephalopathy: Secondary | ICD-10-CM | POA: Diagnosis not present

## 2021-07-12 DIAGNOSIS — R6521 Severe sepsis with septic shock: Secondary | ICD-10-CM | POA: Diagnosis not present

## 2021-07-12 DIAGNOSIS — B9562 Methicillin resistant Staphylococcus aureus infection as the cause of diseases classified elsewhere: Secondary | ICD-10-CM | POA: Diagnosis not present

## 2021-07-12 DIAGNOSIS — I2690 Septic pulmonary embolism without acute cor pulmonale: Secondary | ICD-10-CM | POA: Diagnosis not present

## 2021-07-12 DIAGNOSIS — A4102 Sepsis due to Methicillin resistant Staphylococcus aureus: Secondary | ICD-10-CM | POA: Diagnosis not present

## 2021-07-13 DIAGNOSIS — I330 Acute and subacute infective endocarditis: Secondary | ICD-10-CM | POA: Diagnosis not present

## 2021-07-13 DIAGNOSIS — G9341 Metabolic encephalopathy: Secondary | ICD-10-CM | POA: Diagnosis not present

## 2021-07-13 DIAGNOSIS — R6521 Severe sepsis with septic shock: Secondary | ICD-10-CM | POA: Diagnosis not present

## 2021-07-13 DIAGNOSIS — I2690 Septic pulmonary embolism without acute cor pulmonale: Secondary | ICD-10-CM | POA: Diagnosis not present

## 2021-07-13 DIAGNOSIS — A4102 Sepsis due to Methicillin resistant Staphylococcus aureus: Secondary | ICD-10-CM | POA: Diagnosis not present

## 2021-07-13 DIAGNOSIS — B9562 Methicillin resistant Staphylococcus aureus infection as the cause of diseases classified elsewhere: Secondary | ICD-10-CM | POA: Diagnosis not present

## 2021-07-14 DIAGNOSIS — I2690 Septic pulmonary embolism without acute cor pulmonale: Secondary | ICD-10-CM | POA: Diagnosis not present

## 2021-07-14 DIAGNOSIS — I330 Acute and subacute infective endocarditis: Secondary | ICD-10-CM | POA: Diagnosis not present

## 2021-07-14 DIAGNOSIS — A4102 Sepsis due to Methicillin resistant Staphylococcus aureus: Secondary | ICD-10-CM | POA: Diagnosis not present

## 2021-07-14 DIAGNOSIS — G9341 Metabolic encephalopathy: Secondary | ICD-10-CM | POA: Diagnosis not present

## 2021-07-14 DIAGNOSIS — R6521 Severe sepsis with septic shock: Secondary | ICD-10-CM | POA: Diagnosis not present

## 2021-07-14 DIAGNOSIS — B9562 Methicillin resistant Staphylococcus aureus infection as the cause of diseases classified elsewhere: Secondary | ICD-10-CM | POA: Diagnosis not present

## 2021-07-15 DIAGNOSIS — R6521 Severe sepsis with septic shock: Secondary | ICD-10-CM | POA: Diagnosis not present

## 2021-07-15 DIAGNOSIS — I330 Acute and subacute infective endocarditis: Secondary | ICD-10-CM | POA: Diagnosis not present

## 2021-07-15 DIAGNOSIS — A4102 Sepsis due to Methicillin resistant Staphylococcus aureus: Secondary | ICD-10-CM | POA: Diagnosis not present

## 2021-07-15 DIAGNOSIS — G9341 Metabolic encephalopathy: Secondary | ICD-10-CM | POA: Diagnosis not present

## 2021-07-15 DIAGNOSIS — F411 Generalized anxiety disorder: Secondary | ICD-10-CM | POA: Diagnosis not present

## 2021-07-15 DIAGNOSIS — B9562 Methicillin resistant Staphylococcus aureus infection as the cause of diseases classified elsewhere: Secondary | ICD-10-CM | POA: Diagnosis not present

## 2021-07-15 DIAGNOSIS — I2690 Septic pulmonary embolism without acute cor pulmonale: Secondary | ICD-10-CM | POA: Diagnosis not present

## 2021-07-16 DIAGNOSIS — G9341 Metabolic encephalopathy: Secondary | ICD-10-CM | POA: Diagnosis not present

## 2021-07-16 DIAGNOSIS — R6521 Severe sepsis with septic shock: Secondary | ICD-10-CM | POA: Diagnosis not present

## 2021-07-16 DIAGNOSIS — F32A Depression, unspecified: Secondary | ICD-10-CM | POA: Diagnosis not present

## 2021-07-16 DIAGNOSIS — A4102 Sepsis due to Methicillin resistant Staphylococcus aureus: Secondary | ICD-10-CM | POA: Diagnosis not present

## 2021-07-17 DIAGNOSIS — R6521 Severe sepsis with septic shock: Secondary | ICD-10-CM | POA: Diagnosis not present

## 2021-07-17 DIAGNOSIS — G9341 Metabolic encephalopathy: Secondary | ICD-10-CM | POA: Diagnosis not present

## 2021-07-17 DIAGNOSIS — F32A Depression, unspecified: Secondary | ICD-10-CM | POA: Diagnosis not present

## 2021-07-17 DIAGNOSIS — A4102 Sepsis due to Methicillin resistant Staphylococcus aureus: Secondary | ICD-10-CM | POA: Diagnosis not present

## 2021-07-18 DIAGNOSIS — G9341 Metabolic encephalopathy: Secondary | ICD-10-CM | POA: Diagnosis not present

## 2021-07-18 DIAGNOSIS — I330 Acute and subacute infective endocarditis: Secondary | ICD-10-CM | POA: Diagnosis not present

## 2021-07-18 DIAGNOSIS — I071 Rheumatic tricuspid insufficiency: Secondary | ICD-10-CM | POA: Diagnosis not present

## 2021-07-18 DIAGNOSIS — Z452 Encounter for adjustment and management of vascular access device: Secondary | ICD-10-CM | POA: Diagnosis not present

## 2021-07-18 DIAGNOSIS — B9562 Methicillin resistant Staphylococcus aureus infection as the cause of diseases classified elsewhere: Secondary | ICD-10-CM | POA: Diagnosis not present

## 2021-07-18 DIAGNOSIS — F32A Depression, unspecified: Secondary | ICD-10-CM | POA: Diagnosis not present

## 2021-07-18 DIAGNOSIS — R6521 Severe sepsis with septic shock: Secondary | ICD-10-CM | POA: Diagnosis not present

## 2021-07-18 DIAGNOSIS — R079 Chest pain, unspecified: Secondary | ICD-10-CM | POA: Diagnosis not present

## 2021-07-18 DIAGNOSIS — R918 Other nonspecific abnormal finding of lung field: Secondary | ICD-10-CM | POA: Diagnosis not present

## 2021-07-18 DIAGNOSIS — I2690 Septic pulmonary embolism without acute cor pulmonale: Secondary | ICD-10-CM | POA: Diagnosis not present

## 2021-07-18 DIAGNOSIS — A4102 Sepsis due to Methicillin resistant Staphylococcus aureus: Secondary | ICD-10-CM | POA: Diagnosis not present

## 2021-07-19 DIAGNOSIS — F32A Depression, unspecified: Secondary | ICD-10-CM | POA: Diagnosis not present

## 2021-07-19 DIAGNOSIS — A4102 Sepsis due to Methicillin resistant Staphylococcus aureus: Secondary | ICD-10-CM | POA: Diagnosis not present

## 2021-07-19 DIAGNOSIS — G9341 Metabolic encephalopathy: Secondary | ICD-10-CM | POA: Diagnosis not present

## 2021-07-19 DIAGNOSIS — R6521 Severe sepsis with septic shock: Secondary | ICD-10-CM | POA: Diagnosis not present

## 2021-07-20 DIAGNOSIS — A4102 Sepsis due to Methicillin resistant Staphylococcus aureus: Secondary | ICD-10-CM | POA: Diagnosis not present

## 2021-07-20 DIAGNOSIS — I2690 Septic pulmonary embolism without acute cor pulmonale: Secondary | ICD-10-CM | POA: Diagnosis not present

## 2021-07-20 DIAGNOSIS — I330 Acute and subacute infective endocarditis: Secondary | ICD-10-CM | POA: Diagnosis not present

## 2021-07-20 DIAGNOSIS — G9341 Metabolic encephalopathy: Secondary | ICD-10-CM | POA: Diagnosis not present

## 2021-07-20 DIAGNOSIS — B9562 Methicillin resistant Staphylococcus aureus infection as the cause of diseases classified elsewhere: Secondary | ICD-10-CM | POA: Diagnosis not present

## 2021-07-20 DIAGNOSIS — R6521 Severe sepsis with septic shock: Secondary | ICD-10-CM | POA: Diagnosis not present

## 2021-07-21 DIAGNOSIS — G9341 Metabolic encephalopathy: Secondary | ICD-10-CM | POA: Diagnosis not present

## 2021-07-21 DIAGNOSIS — R6521 Severe sepsis with septic shock: Secondary | ICD-10-CM | POA: Diagnosis not present

## 2021-07-21 DIAGNOSIS — A4102 Sepsis due to Methicillin resistant Staphylococcus aureus: Secondary | ICD-10-CM | POA: Diagnosis not present

## 2021-07-22 DIAGNOSIS — R6521 Severe sepsis with septic shock: Secondary | ICD-10-CM | POA: Diagnosis not present

## 2021-07-22 DIAGNOSIS — I330 Acute and subacute infective endocarditis: Secondary | ICD-10-CM | POA: Diagnosis not present

## 2021-07-22 DIAGNOSIS — B9562 Methicillin resistant Staphylococcus aureus infection as the cause of diseases classified elsewhere: Secondary | ICD-10-CM | POA: Diagnosis not present

## 2021-07-22 DIAGNOSIS — G9341 Metabolic encephalopathy: Secondary | ICD-10-CM | POA: Diagnosis not present

## 2021-07-22 DIAGNOSIS — I2690 Septic pulmonary embolism without acute cor pulmonale: Secondary | ICD-10-CM | POA: Diagnosis not present

## 2021-07-22 DIAGNOSIS — A4102 Sepsis due to Methicillin resistant Staphylococcus aureus: Secondary | ICD-10-CM | POA: Diagnosis not present

## 2021-07-22 DIAGNOSIS — M79642 Pain in left hand: Secondary | ICD-10-CM | POA: Diagnosis not present

## 2021-07-23 DIAGNOSIS — G9341 Metabolic encephalopathy: Secondary | ICD-10-CM | POA: Diagnosis not present

## 2021-07-23 DIAGNOSIS — R6521 Severe sepsis with septic shock: Secondary | ICD-10-CM | POA: Diagnosis not present

## 2021-07-23 DIAGNOSIS — A4102 Sepsis due to Methicillin resistant Staphylococcus aureus: Secondary | ICD-10-CM | POA: Diagnosis not present

## 2021-07-23 DIAGNOSIS — Z419 Encounter for procedure for purposes other than remedying health state, unspecified: Secondary | ICD-10-CM | POA: Diagnosis not present

## 2021-07-24 DIAGNOSIS — R6521 Severe sepsis with septic shock: Secondary | ICD-10-CM | POA: Diagnosis not present

## 2021-07-24 DIAGNOSIS — G9341 Metabolic encephalopathy: Secondary | ICD-10-CM | POA: Diagnosis not present

## 2021-07-24 DIAGNOSIS — A4102 Sepsis due to Methicillin resistant Staphylococcus aureus: Secondary | ICD-10-CM | POA: Diagnosis not present

## 2021-07-25 DIAGNOSIS — G9341 Metabolic encephalopathy: Secondary | ICD-10-CM | POA: Diagnosis not present

## 2021-07-25 DIAGNOSIS — R6521 Severe sepsis with septic shock: Secondary | ICD-10-CM | POA: Diagnosis not present

## 2021-07-25 DIAGNOSIS — I330 Acute and subacute infective endocarditis: Secondary | ICD-10-CM | POA: Diagnosis not present

## 2021-07-25 DIAGNOSIS — A4102 Sepsis due to Methicillin resistant Staphylococcus aureus: Secondary | ICD-10-CM | POA: Diagnosis not present

## 2021-07-25 DIAGNOSIS — B9562 Methicillin resistant Staphylococcus aureus infection as the cause of diseases classified elsewhere: Secondary | ICD-10-CM | POA: Diagnosis not present

## 2021-07-25 DIAGNOSIS — I2690 Septic pulmonary embolism without acute cor pulmonale: Secondary | ICD-10-CM | POA: Diagnosis not present

## 2021-07-26 DIAGNOSIS — R6521 Severe sepsis with septic shock: Secondary | ICD-10-CM | POA: Diagnosis not present

## 2021-07-26 DIAGNOSIS — G9341 Metabolic encephalopathy: Secondary | ICD-10-CM | POA: Diagnosis not present

## 2021-07-26 DIAGNOSIS — R160 Hepatomegaly, not elsewhere classified: Secondary | ICD-10-CM | POA: Diagnosis not present

## 2021-07-26 DIAGNOSIS — A4102 Sepsis due to Methicillin resistant Staphylococcus aureus: Secondary | ICD-10-CM | POA: Diagnosis not present

## 2021-07-26 DIAGNOSIS — K828 Other specified diseases of gallbladder: Secondary | ICD-10-CM | POA: Diagnosis not present

## 2021-07-26 DIAGNOSIS — I2699 Other pulmonary embolism without acute cor pulmonale: Secondary | ICD-10-CM | POA: Diagnosis not present

## 2021-07-26 DIAGNOSIS — F32A Depression, unspecified: Secondary | ICD-10-CM | POA: Diagnosis not present

## 2021-07-27 DIAGNOSIS — R911 Solitary pulmonary nodule: Secondary | ICD-10-CM | POA: Diagnosis not present

## 2021-07-27 DIAGNOSIS — I2690 Septic pulmonary embolism without acute cor pulmonale: Secondary | ICD-10-CM | POA: Diagnosis not present

## 2021-07-27 DIAGNOSIS — A4102 Sepsis due to Methicillin resistant Staphylococcus aureus: Secondary | ICD-10-CM | POA: Diagnosis not present

## 2021-07-27 DIAGNOSIS — G9341 Metabolic encephalopathy: Secondary | ICD-10-CM | POA: Diagnosis not present

## 2021-07-27 DIAGNOSIS — I2699 Other pulmonary embolism without acute cor pulmonale: Secondary | ICD-10-CM | POA: Diagnosis not present

## 2021-07-27 DIAGNOSIS — B9562 Methicillin resistant Staphylococcus aureus infection as the cause of diseases classified elsewhere: Secondary | ICD-10-CM | POA: Diagnosis not present

## 2021-07-27 DIAGNOSIS — R6521 Severe sepsis with septic shock: Secondary | ICD-10-CM | POA: Diagnosis not present

## 2021-07-27 DIAGNOSIS — I330 Acute and subacute infective endocarditis: Secondary | ICD-10-CM | POA: Diagnosis not present

## 2021-07-27 DIAGNOSIS — F32A Depression, unspecified: Secondary | ICD-10-CM | POA: Diagnosis not present

## 2021-07-27 DIAGNOSIS — I2693 Single subsegmental pulmonary embolism without acute cor pulmonale: Secondary | ICD-10-CM | POA: Diagnosis not present

## 2021-07-28 DIAGNOSIS — I2690 Septic pulmonary embolism without acute cor pulmonale: Secondary | ICD-10-CM | POA: Diagnosis not present

## 2021-07-28 DIAGNOSIS — R6521 Severe sepsis with septic shock: Secondary | ICD-10-CM | POA: Diagnosis not present

## 2021-07-28 DIAGNOSIS — A4102 Sepsis due to Methicillin resistant Staphylococcus aureus: Secondary | ICD-10-CM | POA: Diagnosis not present

## 2021-07-28 DIAGNOSIS — I330 Acute and subacute infective endocarditis: Secondary | ICD-10-CM | POA: Diagnosis not present

## 2021-07-28 DIAGNOSIS — G9341 Metabolic encephalopathy: Secondary | ICD-10-CM | POA: Diagnosis not present

## 2021-07-28 DIAGNOSIS — F32A Depression, unspecified: Secondary | ICD-10-CM | POA: Diagnosis not present

## 2021-07-28 DIAGNOSIS — B9562 Methicillin resistant Staphylococcus aureus infection as the cause of diseases classified elsewhere: Secondary | ICD-10-CM | POA: Diagnosis not present

## 2021-07-28 DIAGNOSIS — I2699 Other pulmonary embolism without acute cor pulmonale: Secondary | ICD-10-CM | POA: Diagnosis not present

## 2021-07-29 DIAGNOSIS — R6521 Severe sepsis with septic shock: Secondary | ICD-10-CM | POA: Diagnosis not present

## 2021-07-29 DIAGNOSIS — B9562 Methicillin resistant Staphylococcus aureus infection as the cause of diseases classified elsewhere: Secondary | ICD-10-CM | POA: Diagnosis not present

## 2021-07-29 DIAGNOSIS — G9341 Metabolic encephalopathy: Secondary | ICD-10-CM | POA: Diagnosis not present

## 2021-07-29 DIAGNOSIS — F411 Generalized anxiety disorder: Secondary | ICD-10-CM | POA: Diagnosis not present

## 2021-07-29 DIAGNOSIS — I2690 Septic pulmonary embolism without acute cor pulmonale: Secondary | ICD-10-CM | POA: Diagnosis not present

## 2021-07-29 DIAGNOSIS — I330 Acute and subacute infective endocarditis: Secondary | ICD-10-CM | POA: Diagnosis not present

## 2021-07-29 DIAGNOSIS — F32A Depression, unspecified: Secondary | ICD-10-CM | POA: Diagnosis not present

## 2021-07-29 DIAGNOSIS — A4102 Sepsis due to Methicillin resistant Staphylococcus aureus: Secondary | ICD-10-CM | POA: Diagnosis not present

## 2021-07-30 DIAGNOSIS — A4102 Sepsis due to Methicillin resistant Staphylococcus aureus: Secondary | ICD-10-CM | POA: Diagnosis not present

## 2021-07-30 DIAGNOSIS — B9562 Methicillin resistant Staphylococcus aureus infection as the cause of diseases classified elsewhere: Secondary | ICD-10-CM | POA: Diagnosis not present

## 2021-07-30 DIAGNOSIS — F32A Depression, unspecified: Secondary | ICD-10-CM | POA: Diagnosis not present

## 2021-07-30 DIAGNOSIS — I330 Acute and subacute infective endocarditis: Secondary | ICD-10-CM | POA: Diagnosis not present

## 2021-07-30 DIAGNOSIS — I2690 Septic pulmonary embolism without acute cor pulmonale: Secondary | ICD-10-CM | POA: Diagnosis not present

## 2021-07-30 DIAGNOSIS — R6521 Severe sepsis with septic shock: Secondary | ICD-10-CM | POA: Diagnosis not present

## 2021-07-30 DIAGNOSIS — G9341 Metabolic encephalopathy: Secondary | ICD-10-CM | POA: Diagnosis not present

## 2021-07-30 DIAGNOSIS — F411 Generalized anxiety disorder: Secondary | ICD-10-CM | POA: Diagnosis not present

## 2021-07-31 DIAGNOSIS — A4102 Sepsis due to Methicillin resistant Staphylococcus aureus: Secondary | ICD-10-CM | POA: Diagnosis not present

## 2021-07-31 DIAGNOSIS — R6521 Severe sepsis with septic shock: Secondary | ICD-10-CM | POA: Diagnosis not present

## 2021-07-31 DIAGNOSIS — G9341 Metabolic encephalopathy: Secondary | ICD-10-CM | POA: Diagnosis not present

## 2021-07-31 DIAGNOSIS — B9562 Methicillin resistant Staphylococcus aureus infection as the cause of diseases classified elsewhere: Secondary | ICD-10-CM | POA: Diagnosis not present

## 2021-07-31 DIAGNOSIS — F32A Depression, unspecified: Secondary | ICD-10-CM | POA: Diagnosis not present

## 2021-07-31 DIAGNOSIS — F411 Generalized anxiety disorder: Secondary | ICD-10-CM | POA: Diagnosis not present

## 2021-07-31 DIAGNOSIS — I2690 Septic pulmonary embolism without acute cor pulmonale: Secondary | ICD-10-CM | POA: Diagnosis not present

## 2021-07-31 DIAGNOSIS — I330 Acute and subacute infective endocarditis: Secondary | ICD-10-CM | POA: Diagnosis not present

## 2021-08-01 DIAGNOSIS — R6521 Severe sepsis with septic shock: Secondary | ICD-10-CM | POA: Diagnosis not present

## 2021-08-01 DIAGNOSIS — I2699 Other pulmonary embolism without acute cor pulmonale: Secondary | ICD-10-CM | POA: Diagnosis not present

## 2021-08-01 DIAGNOSIS — I330 Acute and subacute infective endocarditis: Secondary | ICD-10-CM | POA: Diagnosis not present

## 2021-08-01 DIAGNOSIS — A4102 Sepsis due to Methicillin resistant Staphylococcus aureus: Secondary | ICD-10-CM | POA: Diagnosis not present

## 2021-08-01 DIAGNOSIS — M7989 Other specified soft tissue disorders: Secondary | ICD-10-CM | POA: Diagnosis not present

## 2021-08-01 DIAGNOSIS — B9562 Methicillin resistant Staphylococcus aureus infection as the cause of diseases classified elsewhere: Secondary | ICD-10-CM | POA: Diagnosis not present

## 2021-08-01 DIAGNOSIS — G9341 Metabolic encephalopathy: Secondary | ICD-10-CM | POA: Diagnosis not present

## 2021-08-01 DIAGNOSIS — I2690 Septic pulmonary embolism without acute cor pulmonale: Secondary | ICD-10-CM | POA: Diagnosis not present

## 2021-08-02 DIAGNOSIS — A4102 Sepsis due to Methicillin resistant Staphylococcus aureus: Secondary | ICD-10-CM | POA: Diagnosis not present

## 2021-08-02 DIAGNOSIS — I2690 Septic pulmonary embolism without acute cor pulmonale: Secondary | ICD-10-CM | POA: Diagnosis not present

## 2021-08-02 DIAGNOSIS — B9562 Methicillin resistant Staphylococcus aureus infection as the cause of diseases classified elsewhere: Secondary | ICD-10-CM | POA: Diagnosis not present

## 2021-08-02 DIAGNOSIS — R6521 Severe sepsis with septic shock: Secondary | ICD-10-CM | POA: Diagnosis not present

## 2021-08-02 DIAGNOSIS — I330 Acute and subacute infective endocarditis: Secondary | ICD-10-CM | POA: Diagnosis not present

## 2021-08-02 DIAGNOSIS — G9341 Metabolic encephalopathy: Secondary | ICD-10-CM | POA: Diagnosis not present

## 2021-08-03 DIAGNOSIS — R6521 Severe sepsis with septic shock: Secondary | ICD-10-CM | POA: Diagnosis not present

## 2021-08-03 DIAGNOSIS — A4102 Sepsis due to Methicillin resistant Staphylococcus aureus: Secondary | ICD-10-CM | POA: Diagnosis not present

## 2021-08-03 DIAGNOSIS — B9562 Methicillin resistant Staphylococcus aureus infection as the cause of diseases classified elsewhere: Secondary | ICD-10-CM | POA: Diagnosis not present

## 2021-08-03 DIAGNOSIS — G9341 Metabolic encephalopathy: Secondary | ICD-10-CM | POA: Diagnosis not present

## 2021-08-03 DIAGNOSIS — I2690 Septic pulmonary embolism without acute cor pulmonale: Secondary | ICD-10-CM | POA: Diagnosis not present

## 2021-08-03 DIAGNOSIS — I330 Acute and subacute infective endocarditis: Secondary | ICD-10-CM | POA: Diagnosis not present

## 2021-08-04 DIAGNOSIS — I2690 Septic pulmonary embolism without acute cor pulmonale: Secondary | ICD-10-CM | POA: Diagnosis not present

## 2021-08-04 DIAGNOSIS — G9341 Metabolic encephalopathy: Secondary | ICD-10-CM | POA: Diagnosis not present

## 2021-08-04 DIAGNOSIS — B9562 Methicillin resistant Staphylococcus aureus infection as the cause of diseases classified elsewhere: Secondary | ICD-10-CM | POA: Diagnosis not present

## 2021-08-04 DIAGNOSIS — I330 Acute and subacute infective endocarditis: Secondary | ICD-10-CM | POA: Diagnosis not present

## 2021-08-04 DIAGNOSIS — A4102 Sepsis due to Methicillin resistant Staphylococcus aureus: Secondary | ICD-10-CM | POA: Diagnosis not present

## 2021-08-04 DIAGNOSIS — R6521 Severe sepsis with septic shock: Secondary | ICD-10-CM | POA: Diagnosis not present

## 2021-08-05 DIAGNOSIS — I3489 Other nonrheumatic mitral valve disorders: Secondary | ICD-10-CM | POA: Diagnosis not present

## 2021-08-05 DIAGNOSIS — A4102 Sepsis due to Methicillin resistant Staphylococcus aureus: Secondary | ICD-10-CM | POA: Diagnosis not present

## 2021-08-05 DIAGNOSIS — B9562 Methicillin resistant Staphylococcus aureus infection as the cause of diseases classified elsewhere: Secondary | ICD-10-CM | POA: Diagnosis not present

## 2021-08-05 DIAGNOSIS — G9341 Metabolic encephalopathy: Secondary | ICD-10-CM | POA: Diagnosis not present

## 2021-08-05 DIAGNOSIS — I330 Acute and subacute infective endocarditis: Secondary | ICD-10-CM | POA: Diagnosis not present

## 2021-08-05 DIAGNOSIS — R6521 Severe sepsis with septic shock: Secondary | ICD-10-CM | POA: Diagnosis not present

## 2021-08-05 DIAGNOSIS — I2690 Septic pulmonary embolism without acute cor pulmonale: Secondary | ICD-10-CM | POA: Diagnosis not present

## 2021-08-06 DIAGNOSIS — I330 Acute and subacute infective endocarditis: Secondary | ICD-10-CM | POA: Diagnosis not present

## 2021-08-07 DIAGNOSIS — I330 Acute and subacute infective endocarditis: Secondary | ICD-10-CM | POA: Diagnosis not present

## 2021-08-08 DIAGNOSIS — B9562 Methicillin resistant Staphylococcus aureus infection as the cause of diseases classified elsewhere: Secondary | ICD-10-CM | POA: Diagnosis not present

## 2021-08-08 DIAGNOSIS — R6521 Severe sepsis with septic shock: Secondary | ICD-10-CM | POA: Diagnosis not present

## 2021-08-08 DIAGNOSIS — I330 Acute and subacute infective endocarditis: Secondary | ICD-10-CM | POA: Diagnosis not present

## 2021-08-08 DIAGNOSIS — I2690 Septic pulmonary embolism without acute cor pulmonale: Secondary | ICD-10-CM | POA: Diagnosis not present

## 2021-08-08 DIAGNOSIS — G9341 Metabolic encephalopathy: Secondary | ICD-10-CM | POA: Diagnosis not present

## 2021-08-08 DIAGNOSIS — A4102 Sepsis due to Methicillin resistant Staphylococcus aureus: Secondary | ICD-10-CM | POA: Diagnosis not present

## 2021-08-09 DIAGNOSIS — I330 Acute and subacute infective endocarditis: Secondary | ICD-10-CM | POA: Diagnosis not present

## 2021-08-23 DIAGNOSIS — Z419 Encounter for procedure for purposes other than remedying health state, unspecified: Secondary | ICD-10-CM | POA: Diagnosis not present

## 2021-08-25 DIAGNOSIS — F4321 Adjustment disorder with depressed mood: Secondary | ICD-10-CM | POA: Diagnosis not present

## 2021-08-29 DIAGNOSIS — F4321 Adjustment disorder with depressed mood: Secondary | ICD-10-CM | POA: Diagnosis not present

## 2021-09-05 DIAGNOSIS — F4321 Adjustment disorder with depressed mood: Secondary | ICD-10-CM | POA: Diagnosis not present

## 2021-09-13 DIAGNOSIS — F4321 Adjustment disorder with depressed mood: Secondary | ICD-10-CM | POA: Diagnosis not present

## 2021-09-15 DIAGNOSIS — Z20822 Contact with and (suspected) exposure to covid-19: Secondary | ICD-10-CM | POA: Diagnosis not present

## 2021-09-15 DIAGNOSIS — J020 Streptococcal pharyngitis: Secondary | ICD-10-CM | POA: Diagnosis not present

## 2021-09-15 DIAGNOSIS — R509 Fever, unspecified: Secondary | ICD-10-CM | POA: Diagnosis not present

## 2021-09-23 DIAGNOSIS — Z419 Encounter for procedure for purposes other than remedying health state, unspecified: Secondary | ICD-10-CM | POA: Diagnosis not present

## 2021-09-27 DIAGNOSIS — F4321 Adjustment disorder with depressed mood: Secondary | ICD-10-CM | POA: Diagnosis not present

## 2021-10-11 DIAGNOSIS — F4321 Adjustment disorder with depressed mood: Secondary | ICD-10-CM | POA: Diagnosis not present

## 2021-10-23 DIAGNOSIS — Z419 Encounter for procedure for purposes other than remedying health state, unspecified: Secondary | ICD-10-CM | POA: Diagnosis not present

## 2021-11-09 DIAGNOSIS — F4321 Adjustment disorder with depressed mood: Secondary | ICD-10-CM | POA: Diagnosis not present

## 2021-11-23 DIAGNOSIS — F17290 Nicotine dependence, other tobacco product, uncomplicated: Secondary | ICD-10-CM | POA: Diagnosis not present

## 2021-11-23 DIAGNOSIS — M940 Chondrocostal junction syndrome [Tietze]: Secondary | ICD-10-CM | POA: Diagnosis not present

## 2021-11-23 DIAGNOSIS — Z419 Encounter for procedure for purposes other than remedying health state, unspecified: Secondary | ICD-10-CM | POA: Diagnosis not present

## 2021-11-23 DIAGNOSIS — R079 Chest pain, unspecified: Secondary | ICD-10-CM | POA: Diagnosis not present

## 2021-11-23 DIAGNOSIS — F17210 Nicotine dependence, cigarettes, uncomplicated: Secondary | ICD-10-CM | POA: Diagnosis not present

## 2021-11-23 DIAGNOSIS — R0789 Other chest pain: Secondary | ICD-10-CM | POA: Diagnosis not present

## 2021-11-23 DIAGNOSIS — Z86711 Personal history of pulmonary embolism: Secondary | ICD-10-CM | POA: Diagnosis not present

## 2021-12-09 DIAGNOSIS — F4321 Adjustment disorder with depressed mood: Secondary | ICD-10-CM | POA: Diagnosis not present

## 2021-12-19 DIAGNOSIS — R0602 Shortness of breath: Secondary | ICD-10-CM | POA: Diagnosis not present

## 2021-12-19 DIAGNOSIS — F17210 Nicotine dependence, cigarettes, uncomplicated: Secondary | ICD-10-CM | POA: Diagnosis not present

## 2021-12-19 DIAGNOSIS — R0789 Other chest pain: Secondary | ICD-10-CM | POA: Diagnosis not present

## 2021-12-19 DIAGNOSIS — U071 COVID-19: Secondary | ICD-10-CM | POA: Diagnosis not present

## 2021-12-19 DIAGNOSIS — Z79899 Other long term (current) drug therapy: Secondary | ICD-10-CM | POA: Diagnosis not present

## 2021-12-23 DIAGNOSIS — Z419 Encounter for procedure for purposes other than remedying health state, unspecified: Secondary | ICD-10-CM | POA: Diagnosis not present

## 2022-01-03 ENCOUNTER — Encounter: Payer: Self-pay | Admitting: Internal Medicine

## 2022-01-03 ENCOUNTER — Ambulatory Visit: Payer: Medicaid Other | Attending: Internal Medicine | Admitting: Internal Medicine

## 2022-01-03 VITALS — BP 100/56 | HR 94 | Ht 67.0 in | Wt 158.0 lb

## 2022-01-03 DIAGNOSIS — R079 Chest pain, unspecified: Secondary | ICD-10-CM | POA: Diagnosis not present

## 2022-01-03 NOTE — Patient Instructions (Addendum)
Medication Instructions:  Your physician recommends that you continue on your current medications as directed. Please refer to the Current Medication list given to you today.  *If you need a refill on your cardiac medications before your next appointment, please call your pharmacy*   Lab Work: NONE If you have labs (blood work) drawn today and your tests are completely normal, you will receive your results only by: MyChart Message (if you have MyChart) OR A paper copy in the mail If you have any lab test that is abnormal or we need to change your treatment, we will call you to review the results.   Testing/Procedures: Your physician has requested that you have an echocardiogram. Echocardiography is a painless test that uses sound waves to create images of your heart. It provides your doctor with information about the size and shape of your heart and how well your heart's chambers and valves are working. This procedure takes approximately one hour. There are no restrictions for this procedure. Please do NOT wear cologne, perfume, aftershave, or lotions (deodorant is allowed). Please arrive 15 minutes prior to your appointment time.    Follow-Up: At [facility], you and your health needs are our priority.  As part of our continuing mission to provide you with exceptional heart care, we have created designated Provider Care Teams.  These Care Teams include your primary Cardiologist (physician) and Advanced Practice Providers (APPs -  Physician Assistants and Nurse Practitioners) who all work together to provide you with the care you need, when you need it.  We recommend signing up for the patient portal called "MyChart".  Sign up information is provided on this After Visit Summary.  MyChart is used to connect with patients for Virtual Visits (Telemedicine).  Patients are able to view lab/test results, encounter notes, upcoming appointments, etc.  Non-urgent messages can be sent to your  provider as well.   To learn more about what you can do with MyChart, go to [email or web address].    Your next appointment:   1 year(s)  The format for your next appointment:   In Person  Provider:   Maisie Fus, MD

## 2022-01-03 NOTE — Progress Notes (Unsigned)
Cardiology Office Note:    Date:  01/03/2022   ID:  Phyllis Goodman, DOB [birth year], MRN [id number]  PCP:  Claiborne Rigg, NP   [facility] Providers Cardiologist:  Maisie Fus, MD { Click to update primary MD,subspecialty MD or APP then REFRESH:1}    Referring MD: Claiborne Rigg, NP   No chief complaint on file. CP  History of Present Illness:    Phyllis Goodman is a 26 y.o. 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 She was managed with antibiotics. She presented to the ED at [facility] with cough, sore throat, congestion and chest pain. It's sharp, had low grade temp. SARS COV 2 detected. She was referred by her primary provider for chest pain. It's better now. For her IE, she has not followed with cardiology. She notes chest pain and leg swelling.  She notes some SOB. No more IVDU.  Past Medical History:  Diagnosis Date   ADHD (attention deficit hyperactivity disorder)    Allergy     Past Surgical History:  Procedure Laterality Date   CESAREAN SECTION     NO PAST SURGERIES      Current Medications: Current Meds  Medication Sig   mirtazapine (REMERON) 45 MG tablet Take 45 mg by mouth at bedtime.   SUBOXONE 2-0.5 MG FILM Place under the tongue.   traZODone (DESYREL) 100 MG tablet Take 100 mg by mouth at bedtime as needed.     Allergies:   Patient has no known allergies.   Social History   Socioeconomic History   Marital status: Single    Spouse name: Not on file   Number of children: Not on file   Years of education: Not on file   Highest education level: Not on file  Occupational History   Not on file  Tobacco Use   Smoking status: Former    Packs/day: 0.10    Types: Cigarettes    Quit date: 12/2015    Years since quitting: 6.0   Smokeless tobacco: Never  Substance and  Sexual Activity   Alcohol use: Not Currently    Comment: SOCIAL    Drug use: No   Sexual activity: Not Currently    Birth control/protection: None  Other Topics Concern   Not on file  Social History Narrative   ** Merged History Encounter **       Social Determinants of Health   Financial Resource Strain: Not on file  Food Insecurity: Not on file  Transportation Needs: Not on file  Physical Activity: Not on file  Stress: Not on file  Social Connections: Not on file     Family History: The patient's family history includes Seizures in her father.  ROS:   Please see the history of present illness.     All other systems reviewed and are negative.  EKGs/Labs/Other Studies Reviewed:    The following studies were reviewed today:   EKG:  EKG is  ordered today.  The ekg ordered today demonstrates   12/12- NSR  Recent Labs: No results found for requested labs within last 365 days.   Recent Lipid Panel No results found for: "CHOL", "TRIG", "HDL", "CHOLHDL", "VLDL", "LDLCALC", "LDLDIRECT"   Risk Assessment/Calculations:     Physical Exam:    VS:   Vitals:   01/03/22 1558  BP: (!) 100/56  Pulse: 94  SpO2: 98%     Wt Readings from Last 3 Encounters:  01/03/22 158 lb (71.7 kg)  09/19/19 216 lb 1.6 oz (98 kg)  09/17/19 217 lb (98.4 kg)     GEN:  Well nourished, well developed in no acute distress HEENT: Normal NECK: No JVD; No carotid bruits LYMPHATICS: No lymphadenopathy CARDIAC: RRR, no murmurs, rubs, gallops RESPIRATORY:  Clear to auscultation without rales, wheezing or rhonchi  ABDOMEN: Soft, non-tender, non-distended MUSCULOSKELETAL:  No edema; No deformity  SKIN: Warm and dry NEUROLOGIC:  Alert and oriented x 3 PSYCHIATRIC:  Normal affect   ASSESSMENT:    CP: in the setting of a viral illness. Not likely to be cardiac. No significant TR murmur on exam, can repeat echo to assess the valve integrity. No signs of CHF.  MRSA IE: requires antibiotic  prophylaxis prior to dental procedures. 2g amoxicillin 30- 60 minutes prior PLAN:    In order of problems listed above:  TTE Follow up in 1 year      Medication Adjustments/Labs and Tests Ordered: Current medicines are reviewed at length with the patient today.  Concerns regarding medicines are outlined above.  Orders Placed This Encounter  Procedures   EKG 12-Lead   ECHOCARDIOGRAM COMPLETE   No orders of the defined types were placed in this encounter.   Patient Instructions  Medication Instructions:  Your physician recommends that you continue on your current medications as directed. Please refer to the Current Medication list given to you today.  *If you need a refill on your cardiac medications before your next appointment, please call your pharmacy*   Lab Work: NONE If you have labs (blood work) drawn today and your tests are completely normal, you will receive your results only by: MyChart Message (if you have MyChart) OR A paper copy in the mail If you have any lab test that is abnormal or we need to change your treatment, we will call you to review the results.   Testing/Procedures: NONE   Follow-Up: At [facility], you and your health needs are our priority.  As part of our continuing mission to provide you with exceptional heart care, we have created designated Provider Care Teams.  These Care Teams include your primary Cardiologist (physician) and Advanced Practice Providers (APPs -  Physician Assistants and Nurse Practitioners) who all work together to provide you with the care you need, when you need it.  We recommend signing up for the patient portal called "MyChart".  Sign up information is provided on this After Visit Summary.  MyChart is used to connect with patients for Virtual Visits (Telemedicine).  Patients are able to view lab/test results, encounter notes, upcoming appointments, etc.  Non-urgent messages can be sent to your provider as well.    To learn more about what you can do with MyChart, go to [email or web address].    Your next appointment:   1 year(s)  The format for your next appointment:   In Person  Provider:   Maisie Fus, MD     Signed, Maisie Fus, MD  01/03/2022 4:16 PM    [facility]

## 2022-01-09 DIAGNOSIS — F4321 Adjustment disorder with depressed mood: Secondary | ICD-10-CM | POA: Diagnosis not present

## 2022-01-23 DIAGNOSIS — Z419 Encounter for procedure for purposes other than remedying health state, unspecified: Secondary | ICD-10-CM | POA: Diagnosis not present

## 2022-02-01 ENCOUNTER — Ambulatory Visit (HOSPITAL_COMMUNITY): Payer: Medicaid Other | Attending: Internal Medicine

## 2022-02-01 DIAGNOSIS — R079 Chest pain, unspecified: Secondary | ICD-10-CM

## 2022-02-01 LAB — ECHOCARDIOGRAM COMPLETE
Area-P 1/2: 6.43 cm2
S' Lateral: 3.3 cm

## 2022-02-08 DIAGNOSIS — F4321 Adjustment disorder with depressed mood: Secondary | ICD-10-CM | POA: Diagnosis not present

## 2022-02-23 DIAGNOSIS — Z419 Encounter for procedure for purposes other than remedying health state, unspecified: Secondary | ICD-10-CM | POA: Diagnosis not present

## 2022-02-28 DIAGNOSIS — H109 Unspecified conjunctivitis: Secondary | ICD-10-CM | POA: Diagnosis not present

## 2022-03-20 ENCOUNTER — Encounter: Payer: Self-pay | Admitting: Family Medicine

## 2022-03-20 ENCOUNTER — Ambulatory Visit (INDEPENDENT_AMBULATORY_CARE_PROVIDER_SITE_OTHER): Payer: Medicaid Other | Admitting: Family Medicine

## 2022-03-20 VITALS — BP 126/70 | HR 104 | Temp 98.5°F | Ht 67.5 in | Wt 152.4 lb

## 2022-03-20 DIAGNOSIS — Z8679 Personal history of other diseases of the circulatory system: Secondary | ICD-10-CM | POA: Diagnosis not present

## 2022-03-20 DIAGNOSIS — R0602 Shortness of breath: Secondary | ICD-10-CM

## 2022-03-20 DIAGNOSIS — Z23 Encounter for immunization: Secondary | ICD-10-CM

## 2022-03-20 NOTE — Patient Instructions (Signed)
Welcome to [facility]! It was a pleasure meeting you today.  As discussed, Please schedule a 2 month follow up visit today.  Message cardiologist about chest pain Exercise daily-start at 5 minutes/day and work way up to 69mn/day.    [facility]  [place] hours 8=M-F 8:30-5.  closed 12:30-1 lunch   Consider lung doctor.  Stop vaping  PLEASE NOTE:  If you had any LAB tests please let [place] if you have not heard back within a few days. You may see your results on MyChart before we have a chance to review them but we will give you a call once they are reviewed by [place] If we ordered any REFERRALS today, please let [place] if you have not heard from their office within the next week.  Let [place] through MyChart if you are needing REFILLS, or have your pharmacy send [place] request. You can also use MyChart to communicate with me or any office staff.  Please try these tips to maintain a healthy lifestyle:  Eat most of your calories during the day when you are active. Eliminate processed foods including packaged sweets (pies, cakes, cookies), reduce intake of potatoes, white bread, white pasta, and white rice. Look for whole grain options, oat flour or almond flour.  Each meal should contain half fruits/vegetables, one quarter protein, and one quarter carbs (no bigger than a computer mouse).  Cut down on sweet beverages. This includes juice, soda, and sweet tea. Also watch fruit intake, though this is a healthier sweet option, it still contains natural sugar! Limit to 3 servings daily.  Drink at least 1 glass of water with each meal and aim for at least 8 glasses per day  Exercise at least 150 minutes every week.

## 2022-03-20 NOTE — Progress Notes (Signed)
New Patient Office Visit  Subjective:  Patient ID: Phyllis Goodman, female    DOB: [birth year]  Age: 27 y.o. MRN: [id number]  CC:  Chief Complaint  Patient presents with   Establish Care    HPI Phyllis Goodman presents for new pt.    H/o polysubstance-on meds.  [place].  Stable on meds.  No SI.  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  Can last up to 1 hr.  No sob but occ deep breath-Card aware.    SOB-can be at rest or active.  Not necessarily related to cp.  No cough.  No h/o asthma.  Going on for "awhile".  No exercise.   Past Medical History:  Diagnosis Date   ADHD (attention deficit hyperactivity disorder)    Allergy    History of endocarditis    History of pulmonary embolism    d/t endocarditis    Past Surgical History:  Procedure Laterality Date   CESAREAN SECTION     NO PAST SURGERIES      Family History  Problem Relation Age of Onset   Hyperlipidemia Father    Diabetes Father    Alcohol abuse Father     Social History   Socioeconomic History   Marital status: Single    Spouse name: Not on file   Number of children: 2   Years of education: Not on file   Highest education level: Not on file  Occupational History   Not on file  Tobacco Use   Smoking status: Former    Packs/day: 0.10    Types: Cigarettes    Quit date: 12/2015    Years since quitting: 6.2   Smokeless tobacco: Never  Vaping Use   Vaping Use: Some days  Substance and Sexual Activity   Alcohol use: Not Currently    Comment: SOCIAL    Drug use: Not Currently    Types: Methamphetamines   Sexual activity: Not Currently    Birth control/protection: None  Other Topics Concern   Not on file  Social History Narrative   unem   Social Determinants of Health   Financial Resource Strain: Not on file  Food Insecurity: Not on file  Transportation Needs: Not on file  Physical Activity: Not on file  Stress: Not on file  Social  Connections: Not on file  Intimate Partner Violence: Not on file    ROS  ROS: Gen: no fever, chills  Skin: no rash, itching ENT: no ear pain, ear drainage, nasal congestion, rhinorrhea, sinus pressure, sore throat Eyes: no blurry vision, double vision Resp: no cough, wheeze CV: hpi GI: no heartburn, n/v/d/c, abd pain GU: no dysuria, urgency, frequency, hematuria MSK: no joint pain, myalgias, back pain Neuro: no dizziness, headache, weakness, vertigo Psych: no depression, anxiety, insomnia, SI   Objective:   Today's Vitals: BP 126/70 (BP Location: Left Arm, Patient Position: Sitting, Cuff Size: Normal)   Pulse (!) 104   Temp 98.5 F (36.9 C) (Temporal)   Ht 5' 7.5" (1.715 m)   Wt 152 lb 6 oz (69.1 kg)   LMP 03/08/2022   SpO2 97%   Breastfeeding No   BMI 23.51 kg/m   Physical Exam  Gen: WDWN NAD HEENT: NCAT, conjunctiva not injected, sclera nonicteric NECK:  supple, no thyromegaly, no nodes, no carotid bruits CARDIAC: tachy RRR, S1S2+, no murmur. DP 2+B LUNGS: CTAB. No wheezes ABDOMEN:  BS+, soft, NTND, No HSM, no masses EXT:  no  edema MSK: no gross abnormalities.  NEURO: A&O x3.  CN II-XII intact.  PSYCH: normal mood. Good eye contact   Assessment & Plan:   Problem List Items Addressed This Visit       Other   History of endocarditis   Other Visit Diagnoses     SOB (shortness of breath)    -  Primary   Relevant Orders   DG Chest 2 View   Need for influenza vaccination       Relevant Orders   Flu Vaccine QUAD 6+ mos PF IM (Fluarix Quad PF) (Completed)      SOB-h/o PE/endocarditis d/t polysubstance abuse 2023.  Still intermitt sob and cp.  Stop vaping!  Check CXR.  Consider pulm-declines for now.  Reviewed echo.  Advised to message Card about symptoms and plan. H/o endocarditis-see above.  F/u 2 mo annual w/pap.   Outpatient Encounter Medications as of 03/20/2022  Medication Sig   hydrOXYzine (VISTARIL) 25 MG capsule Take 25-50 mg by mouth daily as  needed.   mirtazapine (REMERON) 45 MG tablet Take 45 mg by mouth at bedtime.   SUBOXONE 2-0.5 MG FILM Place under the tongue.   traZODone (DESYREL) 100 MG tablet Take 100 mg by mouth at bedtime as needed.   No facility-administered encounter medications on file as of 03/20/2022.    Follow-up: Return in about 2 months (around 05/19/2022) for annual w/pap.   Wellington Hampshire, MD

## 2022-03-24 DIAGNOSIS — Z419 Encounter for procedure for purposes other than remedying health state, unspecified: Secondary | ICD-10-CM | POA: Diagnosis not present

## 2022-04-24 DIAGNOSIS — Z419 Encounter for procedure for purposes other than remedying health state, unspecified: Secondary | ICD-10-CM | POA: Diagnosis not present

## 2022-05-18 DIAGNOSIS — H1031 Unspecified acute conjunctivitis, right eye: Secondary | ICD-10-CM | POA: Diagnosis not present

## 2022-05-22 ENCOUNTER — Encounter: Payer: Self-pay | Admitting: Family Medicine

## 2022-05-22 ENCOUNTER — Ambulatory Visit (INDEPENDENT_AMBULATORY_CARE_PROVIDER_SITE_OTHER): Payer: Medicaid Other | Admitting: Family Medicine

## 2022-05-22 ENCOUNTER — Other Ambulatory Visit (HOSPITAL_COMMUNITY)
Admission: RE | Admit: 2022-05-22 | Discharge: 2022-05-22 | Disposition: A | Discharge status: Home / Self Care | Payer: Medicaid Other | Source: Ambulatory Visit | Attending: Family Medicine | Admitting: Family Medicine

## 2022-05-22 VITALS — BP 100/60 | HR 80 | Temp 98.5°F | Resp 16 | Ht 67.0 in | Wt 152.0 lb

## 2022-05-22 DIAGNOSIS — Z124 Encounter for screening for malignant neoplasm of cervix: Secondary | ICD-10-CM | POA: Diagnosis not present

## 2022-05-22 DIAGNOSIS — Z0000 Encounter for general adult medical examination without abnormal findings: Secondary | ICD-10-CM | POA: Diagnosis not present

## 2022-05-22 DIAGNOSIS — Z01419 Encounter for gynecological examination (general) (routine) without abnormal findings: Secondary | ICD-10-CM

## 2022-05-22 LAB — LIPID PANEL
Cholesterol: 110 mg/dL (ref 0–200)
HDL: 38.7 mg/dL — ABNORMAL LOW (ref >39.00)
LDL Cholesterol: 62 mg/dL (ref 0–99)
NonHDL: 71.7
Total CHOL/HDL Ratio: 3
Triglycerides: 50 mg/dL (ref 0.0–149.0)
VLDL: 10 mg/dL (ref 0.0–40.0)

## 2022-05-22 LAB — COMPREHENSIVE METABOLIC PANEL
ALT: 10 U/L (ref 0–35)
AST: 22 U/L (ref 0–37)
Albumin: 3.9 g/dL (ref 3.5–5.2)
Alkaline Phosphatase: 69 U/L (ref 39–117)
BUN: 17 mg/dL (ref 6–23)
CO2: 25 mEq/L (ref 19–32)
Calcium: 8.9 mg/dL (ref 8.4–10.5)
Chloride: 105 mEq/L (ref 96–112)
Creatinine, Ser: 0.6 mg/dL (ref 0.40–1.20)
GFR: 122.06 mL/min (ref >60.00)
Glucose, Bld: 77 mg/dL (ref 70–99)
Potassium: 4.5 mEq/L (ref 3.5–5.1)
Sodium: 136 mEq/L (ref 135–145)
Total Bilirubin: 0.5 mg/dL (ref 0.2–1.2)
Total Protein: 6.4 g/dL (ref 6.0–8.3)

## 2022-05-22 LAB — CBC WITH DIFFERENTIAL/PLATELET
Basophils Absolute: 0 10*3/uL (ref 0.0–0.1)
Basophils Relative: 0.3 % (ref 0.0–3.0)
Eosinophils Absolute: 0.1 10*3/uL (ref 0.0–0.7)
Eosinophils Relative: 1 % (ref 0.0–5.0)
HCT: 39 % (ref 36.0–46.0)
Hemoglobin: 13.7 g/dL (ref 12.0–15.0)
Lymphocytes Relative: 32.1 % (ref 12.0–46.0)
Lymphs Abs: 2.3 10*3/uL (ref 0.7–4.0)
MCHC: 35.1 g/dL (ref 30.0–36.0)
MCV: 88.7 fl (ref 78.0–100.0)
Monocytes Absolute: 0.5 10*3/uL (ref 0.1–1.0)
Monocytes Relative: 7 % (ref 3.0–12.0)
Neutro Abs: 4.3 10*3/uL (ref 1.4–7.7)
Neutrophils Relative %: 59.6 % (ref 43.0–77.0)
Platelets: 244 10*3/uL (ref 150.0–400.0)
RBC: 4.4 Mil/uL (ref 3.87–5.11)
RDW: 12.8 % (ref 11.5–15.5)
WBC: 7.3 10*3/uL (ref 4.0–10.5)

## 2022-05-22 LAB — HEMOGLOBIN A1C: Hgb A1c MFr Bld: 5.2 % (ref 4.6–6.5)

## 2022-05-22 LAB — TSH: TSH: 0.75 u[IU]/mL (ref 0.35–5.50)

## 2022-05-22 NOTE — Patient Instructions (Signed)
It was very nice to see you today!  Magnesium 200-400 mg daily.    stretches   PLEASE NOTE:  If you had any lab tests please let us know if you have not heard back within a few days. You may see your results on MyChart before we have a chance to review them but we will give you a call once they are reviewed by [place]. If we ordered any referrals today, please let us know if you have not heard from their office within the next week.   Please try these tips to maintain a healthy lifestyle:  Eat most of your calories during the day when you are active. Eliminate processed foods including packaged sweets (pies, cakes, cookies), reduce intake of potatoes, white bread, white pasta, and white rice. Look for whole grain options, oat flour or almond flour.  Each meal should contain half fruits/vegetables, one quarter protein, and one quarter carbs (no bigger than a computer mouse).  Cut down on sweet beverages. This includes juice, soda, and sweet tea. Also watch fruit intake, though this is a healthier sweet option, it still contains natural sugar! Limit to 3 servings daily.  Drink at least 1 glass of water with each meal and aim for at least 8 glasses per day  Exercise at least 150 minutes every week.

## 2022-05-22 NOTE — Progress Notes (Signed)
Phone [phone]   Subjective:   Patient is a 27 y.o. female presenting for annual physical.    Chief Complaint  Patient presents with   Annual Exam    CPE with pap Not fasting    Gynecologic Exam   Annual-busy at work.  Vaping decreased.  Working on DDS.  See problem oriented charting- ROS- ROS: Gen: no fever, chills  Skin: no rash, itching ENT: no ear pain, ear drainage, nasal congestion, rhinorrhea, sinus pressure, sore throat Eyes: no blurry vision, double vision Resp: no cough, wheeze.  Occasional shortness of breath still CV: no palpitations, LE edema,   occasional CP-no change GI: no heartburn, n/v/d/c, abd pain GU: no dysuria, urgency, frequency, hematuria   menses regular.  Not SA. Pap about 2021.   MSK: no joint pain, myalgias, back pain Neuro: no dizziness,, weakness, vertigo.   Getting daily headache(s) frontal, no nausea and vomiting, no photo/phono.  Can be severe or more just annoying.  Ibu helps some but keeps returning. Sleeping ok. No caffeine. No artificial sweeteners.  Psych: no  SI ,   anxiety/depression-taking meds  The following were reviewed and entered/updated in epic: Past Medical History:  Diagnosis Date   ADHD (attention deficit hyperactivity disorder)    Allergy    History of endocarditis    History of pulmonary embolism    d/t endocarditis   Patient Active Problem List   Diagnosis Date Noted   History of endocarditis 03/20/2022   VBAC, delivered 09/20/2019   PPH (postpartum hemorrhage) 09/20/2019   Post-dates pregnancy 09/19/2019   Group beta Strep positive 08/22/2019   History of cesarean section 06/26/2019   Prior pregnancy with congenital cardiac defect, antepartum 06/26/2019   Rh negative state in antepartum period 06/17/2019   Supervision of other normal pregnancy, antepartum 05/14/2019   History of drug abuse (HCC) 07/28/2015   ADHD (attention deficit hyperactivity disorder), combined type 06/28/2015   Dysgraphia 06/28/2015    Past Surgical History:  Procedure Laterality Date   CESAREAN SECTION     NO PAST SURGERIES      Family History  Problem Relation Age of Onset   Hyperlipidemia Father    Diabetes Father    Alcohol abuse Father     Medications- reviewed and updated Current Outpatient Medications  Medication Sig Dispense Refill   erythromycin ophthalmic ointment PLEASE SEE ATTACHED FOR DETAILED DIRECTIONS     hydrOXYzine (VISTARIL) 25 MG capsule Take 25-50 mg by mouth daily as needed.     mirtazapine (REMERON) 45 MG tablet Take 45 mg by mouth at bedtime.     SUBOXONE 2-0.5 MG FILM Place under the tongue.     traZODone (DESYREL) 100 MG tablet Take 100 mg by mouth at bedtime as needed.     No current facility-administered medications for this visit.    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 DP 2+B LUNGS: CTAB. No wheezes ABDOMEN:  BS+, soft, NTND, No HSM, no  masses EXT:  no edema MSK: no gross abnormalities. MS 5/5 all 4 NEURO: A&O x3.  CN II-XII intact.  PSYCH: normal mood. Good eye contact   Breasts: Examined lying .              Right:   Without masses, retractions,  nipple discharge or axillary adenopathy.               Left:     Without masses, retractions, nipple discharge or axillary adenopathy. Genitourinary              Inguinal/mons:  Normal without inguinal adenopathy             External genitalia:  Normal appearing vulva with no masses, tenderness, or lesions             BUS/Urethra/Skene's glands:   Normal             Vagina:  Normal appearing with normal color and discharge, no lesions             Cervix:  Normal appearing without discharge or lesions             Uterus:  Normal in size, shape and contour.  Midline and mobile, nontender             Adnexa/parametria:                           Rt:        Normal in size, without masses or tenderness.                         Lt:        Normal in size, without masses or tenderness.             Anus and perineum: Normal  Chaperone present QJ     Assessment and Plan   Health Maintenance counseling: 1. Anticipatory guidance: Patient counseled regarding regular dental exams q6 months, eye exams,  avoiding smoking and second hand smoke, limiting alcohol to 1 beverage per day, no illicit drugs.   2. Risk factor reduction:  Advised patient of need for regular exercise and diet rich and fruits and vegetables to reduce risk of heart attack and stroke. Exercise- encouraged.  Wt Readings from Last 3 Encounters:  05/22/22 152 lb (68.9 kg)  03/20/22 152 lb 6 oz (69.1 kg)  01/03/22 158 lb (71.7 kg)   3. Immunizations/screenings/ancillary studies Immunization History  Administered Date(s) Administered   Influenza,inj,Quad PF,6+ Mos 03/20/2022   Tdap 06/26/2019, 09/21/2019   Health Maintenance Due  Topic Date Due   PAP-Cervical Cytology Screening  05/14/2022   PAP SMEAR-Modifier  05/14/2022    4. Cervical cancer screening: done today 5. Skin cancer screening- advised regular sunscreen use. Denies worrisome, changing, or new skin lesions.  6. Birth control/STD check: not SA-aware to use condoms if decides and let [place] know as will need reliable birth control 7. Smoking associated screening: vaping smoker 8. Alcohol screening: none  Well woman exam with routine gynecological exam -     Cytology - PAP -     CBC with Differential/Platelet -     Comprehensive metabolic panel -     Lipid panel -     TSH -     Hemoglobin A1c  Screening for  cervical cancer -     Cytology - PAP   Wellness-anticipatory guidance.  Work on  Diet/Exercise  Check CBC,CMP,lipids,TSH, A1C.  F/u 1 yr  check pap.  Encouraged to keep on track and quit vaping  Recommended follow up: Return in about 6 months (around 11/21/2022).  Lab/Order associations:4 hrs fasting   Angelena Sole, MD

## 2022-05-22 NOTE — Progress Notes (Signed)
Labs look great except good cholesterol (HDL) is low.  Needs to work on exercise to try to increase.  May also be hereditary.

## 2022-05-24 DIAGNOSIS — Z419 Encounter for procedure for purposes other than remedying health state, unspecified: Secondary | ICD-10-CM | POA: Diagnosis not present

## 2022-05-25 LAB — CYTOLOGY - PAP: Diagnosis: NEGATIVE

## 2022-06-24 DIAGNOSIS — Z419 Encounter for procedure for purposes other than remedying health state, unspecified: Secondary | ICD-10-CM | POA: Diagnosis not present

## 2022-07-24 DIAGNOSIS — Z419 Encounter for procedure for purposes other than remedying health state, unspecified: Secondary | ICD-10-CM | POA: Diagnosis not present

## 2022-08-20 DIAGNOSIS — H109 Unspecified conjunctivitis: Secondary | ICD-10-CM | POA: Diagnosis not present

## 2022-08-24 DIAGNOSIS — Z419 Encounter for procedure for purposes other than remedying health state, unspecified: Secondary | ICD-10-CM | POA: Diagnosis not present

## 2022-09-24 DIAGNOSIS — Z419 Encounter for procedure for purposes other than remedying health state, unspecified: Secondary | ICD-10-CM | POA: Diagnosis not present

## 2022-10-12 DIAGNOSIS — F341 Dysthymic disorder: Secondary | ICD-10-CM | POA: Diagnosis not present

## 2022-10-24 DIAGNOSIS — Z419 Encounter for procedure for purposes other than remedying health state, unspecified: Secondary | ICD-10-CM | POA: Diagnosis not present

## 2022-10-30 DIAGNOSIS — B079 Viral wart, unspecified: Secondary | ICD-10-CM | POA: Diagnosis not present

## 2022-10-30 DIAGNOSIS — L700 Acne vulgaris: Secondary | ICD-10-CM | POA: Diagnosis not present

## 2022-11-21 ENCOUNTER — Encounter: Payer: Self-pay | Admitting: Family Medicine

## 2022-11-21 ENCOUNTER — Ambulatory Visit (INDEPENDENT_AMBULATORY_CARE_PROVIDER_SITE_OTHER): Payer: Medicaid Other | Admitting: Family Medicine

## 2022-11-21 VITALS — BP 112/78 | HR 74 | Wt 149.8 lb

## 2022-11-21 DIAGNOSIS — Z23 Encounter for immunization: Secondary | ICD-10-CM | POA: Diagnosis not present

## 2022-11-21 DIAGNOSIS — S90222A Contusion of left lesser toe(s) with damage to nail, initial encounter: Secondary | ICD-10-CM

## 2022-11-21 DIAGNOSIS — F1911 Other psychoactive substance abuse, in remission: Secondary | ICD-10-CM

## 2022-11-21 DIAGNOSIS — R519 Headache, unspecified: Secondary | ICD-10-CM | POA: Diagnosis not present

## 2022-11-21 NOTE — Progress Notes (Signed)
Subjective:    Patient ID: Phyllis Goodman, female    DOB: 1995-10-06, 27 y.o.   MRN: [id number]  Chief Complaint  Patient presents with   Medical Management of Chronic Issues    No further questions/concerns.    HPI Depression - Managed with Hydroxyzine 25-50 mg as needed, Remeron 45 mg and Trazodone 100 mg nightly. Tolerating well.  Headaches - Infrequent. Doesn't feel like they need treatment currently.    Discolored Great Toe (Left) - Reports that she noticed a bruise under her great toenail 2 months ago that hasn't resolved. States she hasn't started wearing new shoes and the area is not painful. Notes she doesn't get pedicures.   Low HDL - Discussed low HLD at last physical. Reports she is exercising daily, where she wasn't before.   There are no preventive care reminders to display for this patient.   Past Medical History:  Diagnosis Date   ADHD (attention deficit hyperactivity disorder)    Allergy    History of endocarditis    History of pulmonary embolism    d/t endocarditis    Past Surgical History:  Procedure Laterality Date   CESAREAN SECTION     NO PAST SURGERIES       Current Outpatient Medications:    hydrOXYzine (VISTARIL) 25 MG capsule, Take 25-50 mg by mouth daily as needed., Disp: , Rfl:    mirtazapine (REMERON) 45 MG tablet, Take 45 mg by mouth at bedtime., Disp: , Rfl:    SUBOXONE 2-0.5 MG FILM, Place under the tongue., Disp: , Rfl:    traZODone (DESYREL) 100 MG tablet, Take 100 mg by mouth at bedtime as needed., Disp: , Rfl:   No Known Allergies ROS neg/noncontributory except as noted HPI/below  Objective:  BP 112/78   Pulse 74   Wt 149 lb 12.8 oz (67.9 kg)   LMP 10/28/2022   SpO2 99%   BMI 23.46 kg/m  Wt Readings from Last 3 Encounters:  11/21/22 149 lb 12.8 oz (67.9 kg)  05/22/22 152 lb (68.9 kg)  03/20/22 152 lb 6 oz (69.1 kg)   Physical Exam   Gen: WDWN NAD HEENT: NCAT, conjunctiva not injected, sclera nonicteric NECK:   supple, no thyromegaly, no nodes, no carotid bruits CARDIAC: RRR, S1S2+, no murmur. DP 2+B LUNGS: CTAB. No wheezes ABDOMEN:  BS+, soft, NTND, No HSM, no masses EXT:  no edema +hyperpigmented area on L Great Toe, distal half lateral side.   MSK: no gross abnormalities.  NEURO: A&O x3.  CN II-XII intact.  PSYCH: normal mood. Good eye contact Assessment & Plan:  Nonintractable episodic headache, unspecified headache type  Toenail bruise, left, initial encounter  History of drug abuse (HCC)  Need for influenza vaccination -     Flu vaccine trivalent PF, 6mos and older(Flulaval,Afluria,Fluarix,Fluzone)   Intermitt HA-doing well  not a problem.  Getting less Toenail lesion-?onycholysis, trauma, other.  Looks benign-monitor for now.  Changes, not moving w/growth of nail, getting worse, let us know, H/o IVDA-doing well.  In tx.    Return in about 6 months (around 05/22/2023) for annual physical.    I,Emily Lagle,acting as a scribe for Angelena Sole, MD.,have documented all relevant documentation on the behalf of Angelena Sole, MD,as directed by  Angelena Sole, MD while in the presence of Angelena Sole, MD.  I, Angelena Sole, MD, have reviewed all documentation for this visit. The documentation on 11/21/22 for the exam, diagnosis, procedures, and orders are all accurate  and complete.  (refresh reminder)  Angelena Sole, MD

## 2022-11-21 NOTE — Patient Instructions (Signed)

## 2022-11-24 DIAGNOSIS — Z419 Encounter for procedure for purposes other than remedying health state, unspecified: Secondary | ICD-10-CM | POA: Diagnosis not present

## 2022-12-24 DIAGNOSIS — Z419 Encounter for procedure for purposes other than remedying health state, unspecified: Secondary | ICD-10-CM | POA: Diagnosis not present

## 2023-01-24 DIAGNOSIS — Z419 Encounter for procedure for purposes other than remedying health state, unspecified: Secondary | ICD-10-CM | POA: Diagnosis not present

## 2023-02-24 DIAGNOSIS — Z419 Encounter for procedure for purposes other than remedying health state, unspecified: Secondary | ICD-10-CM | POA: Diagnosis not present

## 2023-03-20 ENCOUNTER — Ambulatory Visit (HOSPITAL_COMMUNITY)
Admission: EM | Admit: 2023-03-20 | Discharge: 2023-03-20 | Discharge status: Against Medical Advice | Payer: Medicaid Other

## 2023-03-20 ENCOUNTER — Ambulatory Visit (HOSPITAL_COMMUNITY)
Admission: EM | Admit: 2023-03-20 | Discharge: 2023-03-20 | Disposition: A | Discharge status: Home / Self Care | Payer: Medicaid Other | Attending: Nurse Practitioner | Admitting: Nurse Practitioner

## 2023-03-20 DIAGNOSIS — F1510 Other stimulant abuse, uncomplicated: Secondary | ICD-10-CM

## 2023-03-20 DIAGNOSIS — F32A Depression, unspecified: Secondary | ICD-10-CM | POA: Insufficient documentation

## 2023-03-20 DIAGNOSIS — F418 Other specified anxiety disorders: Secondary | ICD-10-CM | POA: Insufficient documentation

## 2023-03-20 DIAGNOSIS — Z79899 Other long term (current) drug therapy: Secondary | ICD-10-CM | POA: Insufficient documentation

## 2023-03-20 DIAGNOSIS — F1910 Other psychoactive substance abuse, uncomplicated: Secondary | ICD-10-CM

## 2023-03-20 DIAGNOSIS — F909 Attention-deficit hyperactivity disorder, unspecified type: Secondary | ICD-10-CM | POA: Insufficient documentation
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
## 2023-03-20 NOTE — ED Provider Notes (Signed)
 Behavioral Health Urgent Care Medical Screening Exam  Patient Name: Phyllis Goodman MRN: [id number] Date of Evaluation: 03/20/23 Chief Complaint:  "Just to talk to someone about doing outpatient for drugs". Diagnosis:  Final diagnoses:  Substance abuse (HCC)  Methamphetamine abuse (HCC)    History of Present illness: Phyllis Goodman is a 28 y.o. female.  With psychiatric history of ADHD, pressure, anxiety, substance abuse, who presented voluntarily as a walk-in to [facility] accompanied by her mother, and seeking outpatient substance abuse resources.  Patient was seen face-to-face by this provider and chart reviewed.  On evaluation, patient is alert, oriented x 3, and cooperative. Speech is clear, normal rate and coherent. Pt appears casually dressed. Eye contact is good. Mood is euthymic, affect is congruent with mood. Thought process is coherent and goal directed and thought content is WDL. Pt denies SI/HI/AVH. There is no objective indication that the patient is responding to internal stimuli. No delusions elicited during this assessment.    Patient reports "I just came to talk to someone about doing outpatient for drugs, methamphetamine, I've been sober for a year, I just relapsed for the past 2 weeks, I already have a behavioral health doctor Tonna Corner at [place] ), and I'm prescribed Suboxone.  Patient reports "I relapsed on methamphetamine 2 weeks ago, I did it a couple of times already".  Patient denies current withdrawal symptoms. Patient denies other illicit substance use.  Patient reports she lives with her parents and her 2 young children.  Patient reports her sleep and appetite is as fair. Patient reports she is currently prescribed mirtazapine for depression, as needed hydroxyzine for anxiety, and Suboxone.  Patient reports she is medication adherent.  Patient reports feeling safe returning home tonight and will follow up with outpatient substance abuse resources as  provided.  Support, encouragement, reassurance provided about ongoing stressors.    Discussed recommendation for outpatient substance abuse treatment.  Patient is provided with opportunity for questions.  She verbalizes her understanding and is in agreement.  Flowsheet Row ED from 03/20/2023 in [facility]  C-SSRS RISK CATEGORY No Risk       Psychiatric Specialty Exam  Presentation  General Appearance:Casual  Eye Contact:Good  Speech:Clear and Coherent  Speech Volume:Normal  Handedness:Right   Mood and Affect  Mood: Euthymic  Affect: Congruent   Thought Process  Thought Processes: Coherent; Goal Directed  Descriptions of Associations:Intact  Orientation:Full (Time, Place and Person)  Thought Content:WDL    Hallucinations:None  Ideas of Reference:None  Suicidal Thoughts:No  Homicidal Thoughts:No   Sensorium  Memory: Immediate Fair  Judgment: Poor  Insight: Shallow   Executive Functions  Concentration: Fair  Attention Span: Fair  Recall: Fair  Fund of Knowledge: Fair  Language: Fair   Psychomotor Activity  Psychomotor Activity: Normal   Assets  Assets: Manufacturing systems engineer; Desire for Improvement; Social Support   Sleep  Sleep: Fair  Number of hours: No data recorded  Physical Exam: Physical Exam Constitutional:      General: She is not in acute distress.    Appearance: She is not diaphoretic.  HENT:     Head: Normocephalic.     Right Ear: External ear normal.     Left Ear: External ear normal.     Nose: No congestion.  Eyes:     General:        Right eye: No discharge.        Left eye: No discharge.  Cardiovascular:     Rate  and Rhythm: Normal rate.  Pulmonary:     Effort: No respiratory distress.  Chest:     Chest wall: No tenderness.  Neurological:     Mental Status: She is alert and oriented to person, place, and time.  Psychiatric:        Attention and Perception:  Attention and perception normal.        Mood and Affect: Mood and affect normal.        Speech: Speech normal.        Behavior: Behavior is cooperative.        Thought Content: Thought content normal.        Cognition and Memory: Cognition and memory normal.    Review of Systems  Constitutional:  Negative for chills, diaphoresis and fever.  HENT:  Negative for congestion.   Eyes:  Negative for discharge.  Respiratory:  Negative for cough, shortness of breath and wheezing.   Cardiovascular:  Negative for chest pain and palpitations.  Gastrointestinal:  Negative for diarrhea, nausea and vomiting.  Neurological:  Negative for dizziness, seizures, weakness and headaches.  Psychiatric/Behavioral:  Positive for substance abuse.    Blood pressure 118/74, pulse 93, temperature 98.3 F (36.8 C), temperature source Oral, resp. rate 18, SpO2 99%. There is no height or weight on file to calculate BMI.  Musculoskeletal: Strength & Muscle Tone: within normal limits Gait & Station: normal Patient leans: N/A   [facility] MSE Discharge Disposition for Follow up and Recommendations: Based on my evaluation the patient does not appear to have an emergency medical condition and can be discharged with resources and follow up care in outpatient services for Substance Abuse Intensive Outpatient Program   Patient denies SI/HI/AVH or paranoia.  Patient does not meet inpatient psychiatric admission criteria or IVC criteria at this time.  There is no evidence of imminent risk of harm to self or others.  Recommend discharge home and follow-up with substance abuse intensive outpatient program.  Resources provided.  Discharge recommendations:  Patient is to take medications as prescribed. Please see information for follow-up appointment with psychiatry and therapy. Please follow up with your primary care provider for all medical related needs.   Therapy: We recommend that patient participate in individual therapy  to address mental health concerns.  Medications: The patient or guardian is to contact a medical professional and/or outpatient provider to address any new side effects that develop. The patient or guardian should update outpatient providers of any new medications and/or medication changes.   Safety:  The patient should abstain from use of illicit substances/drugs and abuse of any medications. If symptoms worsen or do not continue to improve or if the patient becomes actively suicidal or homicidal then it is recommended that the patient return to the closest hospital emergency department, the [facility], or call 911 for further evaluation and treatment. National Suicide Prevention Lifeline 1-800-SUICIDE or 303-861-8383.  About 988 988 offers 24/7 access to trained crisis counselors who can help people experiencing mental health-related distress. People can call or text 988 or chat 988lifeline.org for themselves or if they are worried about a loved one who may need crisis support.  Crisis Mobile: [facility]:                     801 794 3420 (for crisis response 24 hours a day) [facility] Hotline:  (276) 288-3646   Patient is discharged home in stable condition. Strict [facility] return protocols discussed.   Mancel Bale, NP 03/20/2023, 8:54 PM    Isa Rankin, MD 03/27/23 [phone]

## 2023-03-20 NOTE — ED Notes (Signed)
 Per triage, patient left AMA.

## 2023-03-20 NOTE — Discharge Instructions (Addendum)

## 2023-03-20 NOTE — ED Notes (Signed)
 Pt was given AVS and OP resources she had no belongings in locker

## 2023-03-24 DIAGNOSIS — Z419 Encounter for procedure for purposes other than remedying health state, unspecified: Secondary | ICD-10-CM | POA: Diagnosis not present

## 2023-04-03 ENCOUNTER — Emergency Department (HOSPITAL_COMMUNITY)
Admission: EM | Admit: 2023-04-03 | Discharge: 2023-04-04 | Disposition: A | Discharge status: Other Acute Inpatient Hospital | Attending: Emergency Medicine | Admitting: Emergency Medicine

## 2023-04-03 ENCOUNTER — Emergency Department (HOSPITAL_COMMUNITY)

## 2023-04-03 ENCOUNTER — Encounter (HOSPITAL_COMMUNITY): Payer: Self-pay | Admitting: Emergency Medicine

## 2023-04-03 ENCOUNTER — Other Ambulatory Visit: Payer: Self-pay

## 2023-04-03 DIAGNOSIS — S199XXA Unspecified injury of neck, initial encounter: Secondary | ICD-10-CM | POA: Diagnosis not present

## 2023-04-03 DIAGNOSIS — R0689 Other abnormalities of breathing: Secondary | ICD-10-CM | POA: Diagnosis not present

## 2023-04-03 DIAGNOSIS — D72829 Elevated white blood cell count, unspecified: Secondary | ICD-10-CM | POA: Insufficient documentation

## 2023-04-03 DIAGNOSIS — S0990XA Unspecified injury of head, initial encounter: Secondary | ICD-10-CM | POA: Insufficient documentation

## 2023-04-03 DIAGNOSIS — Z91148 Patient's other noncompliance with medication regimen for other reason: Secondary | ICD-10-CM | POA: Insufficient documentation

## 2023-04-03 DIAGNOSIS — R000 Tachycardia, unspecified: Secondary | ICD-10-CM | POA: Diagnosis not present

## 2023-04-03 DIAGNOSIS — R404 Transient alteration of awareness: Secondary | ICD-10-CM | POA: Diagnosis not present

## 2023-04-03 DIAGNOSIS — X58XXXA Exposure to other specified factors, initial encounter: Secondary | ICD-10-CM | POA: Diagnosis not present

## 2023-04-03 DIAGNOSIS — Z79899 Other long term (current) drug therapy: Secondary | ICD-10-CM | POA: Diagnosis not present

## 2023-04-03 DIAGNOSIS — E860 Dehydration: Secondary | ICD-10-CM | POA: Diagnosis not present

## 2023-04-03 DIAGNOSIS — F17290 Nicotine dependence, other tobacco product, uncomplicated: Secondary | ICD-10-CM | POA: Diagnosis not present

## 2023-04-03 DIAGNOSIS — R55 Syncope and collapse: Secondary | ICD-10-CM | POA: Diagnosis not present

## 2023-04-03 DIAGNOSIS — F1520 Other stimulant dependence, uncomplicated: Secondary | ICD-10-CM

## 2023-04-03 DIAGNOSIS — S0181XA Laceration without foreign body of other part of head, initial encounter: Secondary | ICD-10-CM | POA: Diagnosis not present

## 2023-04-03 DIAGNOSIS — F15221 Other stimulant dependence with intoxication delirium: Secondary | ICD-10-CM | POA: Insufficient documentation

## 2023-04-03 DIAGNOSIS — E162 Hypoglycemia, unspecified: Secondary | ICD-10-CM | POA: Diagnosis not present

## 2023-04-03 DIAGNOSIS — F15921 Other stimulant use, unspecified with intoxication delirium: Secondary | ICD-10-CM | POA: Insufficient documentation

## 2023-04-03 LAB — RAPID URINE DRUG SCREEN, HOSP PERFORMED
Amphetamines: POSITIVE — AB
Barbiturates: NOT DETECTED
Benzodiazepines: POSITIVE — AB
Cocaine: NOT DETECTED
Opiates: POSITIVE — AB
Tetrahydrocannabinol: NOT DETECTED

## 2023-04-03 LAB — COMPREHENSIVE METABOLIC PANEL
ALT: 15 U/L (ref 0–44)
AST: 28 U/L (ref 15–41)
Albumin: 3.2 g/dL — ABNORMAL LOW (ref 3.5–5.0)
Alkaline Phosphatase: 79 U/L (ref 38–126)
Anion gap: 7 (ref 5–15)
BUN: 17 mg/dL (ref 6–20)
CO2: 24 mmol/L (ref 22–32)
Calcium: 8.3 mg/dL — ABNORMAL LOW (ref 8.9–10.3)
Chloride: 106 mmol/L (ref 98–111)
Creatinine, Ser: 1.01 mg/dL — ABNORMAL HIGH (ref 0.44–1.00)
GFR, Estimated: >60 mL/min (ref >60)
Glucose, Bld: 159 mg/dL — ABNORMAL HIGH (ref 70–99)
Potassium: 3.6 mmol/L (ref 3.5–5.1)
Sodium: 137 mmol/L (ref 135–145)
Total Bilirubin: 0.5 mg/dL (ref 0.0–1.2)
Total Protein: 6.3 g/dL — ABNORMAL LOW (ref 6.5–8.1)

## 2023-04-03 LAB — CBC WITH DIFFERENTIAL/PLATELET
Abs Immature Granulocytes: 0.13 10*3/uL — ABNORMAL HIGH (ref 0.00–0.07)
Basophils Absolute: 0.1 10*3/uL (ref 0.0–0.1)
Basophils Relative: 0 %
Eosinophils Absolute: 0.5 10*3/uL (ref 0.0–0.5)
Eosinophils Relative: 2 %
HCT: 33.8 % — ABNORMAL LOW (ref 36.0–46.0)
Hemoglobin: 11.5 g/dL — ABNORMAL LOW (ref 12.0–15.0)
Immature Granulocytes: 1 %
Lymphocytes Relative: 10 %
Lymphs Abs: 2.2 10*3/uL (ref 0.7–4.0)
MCH: 31 pg (ref 26.0–34.0)
MCHC: 34 g/dL (ref 30.0–36.0)
MCV: 91.1 fL (ref 80.0–100.0)
Monocytes Absolute: 1.1 10*3/uL — ABNORMAL HIGH (ref 0.1–1.0)
Monocytes Relative: 5 %
Neutro Abs: 19.3 10*3/uL — ABNORMAL HIGH (ref 1.7–7.7)
Neutrophils Relative %: 82 %
Platelets: 387 10*3/uL (ref 150–400)
RBC: 3.71 MIL/uL — ABNORMAL LOW (ref 3.87–5.11)
RDW: 12.3 % (ref 11.5–15.5)
WBC: 23.3 10*3/uL — ABNORMAL HIGH (ref 4.0–10.5)
nRBC: 0 % (ref 0.0–0.2)

## 2023-04-03 LAB — CBG MONITORING, ED
Glucose-Capillary: 173 mg/dL — ABNORMAL HIGH (ref 70–99)
Glucose-Capillary: 157 mg/dL — ABNORMAL HIGH (ref 70–99)
Glucose-Capillary: 67 mg/dL — ABNORMAL LOW (ref 70–99)
Glucose-Capillary: 74 mg/dL (ref 70–99)
Glucose-Capillary: 81 mg/dL (ref 70–99)
Glucose-Capillary: 109 mg/dL — ABNORMAL HIGH (ref 70–99)

## 2023-04-03 LAB — ETHANOL: Alcohol, Ethyl (B): <10 mg/dL (ref <10)

## 2023-04-03 LAB — CK
Total CK: 654 U/L — ABNORMAL HIGH (ref 38–234)
Total CK: 713 U/L — ABNORMAL HIGH (ref 38–234)

## 2023-04-03 LAB — LIPASE, BLOOD: Lipase: 25 U/L (ref 11–51)

## 2023-04-03 LAB — HCG, SERUM, QUALITATIVE: Preg, Serum: NEGATIVE

## 2023-04-03 MED ORDER — SODIUM CHLORIDE 0.9 % IV BOLUS
1000.0000 mL | Freq: Once | INTRAVENOUS | Status: AC
  Administered 2023-04-03: 1000 mL via INTRAVENOUS

## 2023-04-03 MED ORDER — LORAZEPAM 2 MG/ML IJ SOLN
1.0000 mg | Freq: Once | INTRAMUSCULAR | Status: AC
  Administered 2023-04-03: 1 mg via INTRAVENOUS

## 2023-04-03 MED ORDER — DEXTROSE 10 % IV SOLN
INTRAVENOUS | Status: DC
  Filled 2023-04-03: qty 1000

## 2023-04-03 MED ORDER — DEXTROSE 50 % IV SOLN
1.0000 | Freq: Once | INTRAVENOUS | Status: AC
  Administered 2023-04-03: 50 mL via INTRAVENOUS
  Filled 2023-04-03: qty 50

## 2023-04-03 MED ORDER — LIDOCAINE HCL (PF) 1 % IJ SOLN
5.0000 mL | Freq: Once | INTRAMUSCULAR | Status: AC
  Administered 2023-04-03: 5 mL
  Filled 2023-04-03: qty 30

## 2023-04-03 MED ORDER — SODIUM CHLORIDE 0.9 % IV BOLUS
1000.0000 mL | Freq: Once | INTRAVENOUS | Status: AC
Start: 2023-04-03 — End: 2023-04-03
  Administered 2023-04-03: 1000 mL via INTRAVENOUS

## 2023-04-03 NOTE — ED Triage Notes (Signed)
 Pt arrives [facility]/ [facility] w/ OD of unknown substance. Pt combative upon EMS arrival. EMS gave 2.5 midazolam pt went unresponsive- bagging upon arrival to ED
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
## 2023-04-03 NOTE — ED Notes (Signed)
 Mother Eunice Blase called for an updated.
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
## 2023-04-03 NOTE — ED Notes (Signed)
 Rainbow tubes sent to lab.
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
## 2023-04-03 NOTE — ED Notes (Addendum)
 Patient resting, breaths even and unlabored.

## 2023-04-03 NOTE — ED Notes (Signed)
 Patient to room 35. Patient ambulated to room .   Patient calm and drowsy.   Patient oriented to unit and room.

## 2023-04-03 NOTE — ED Notes (Signed)
 Pt has verbally stated staff cannot speak with her parents concerning her condition.

## 2023-04-04 ENCOUNTER — Other Ambulatory Visit (HOSPITAL_COMMUNITY)
Admission: EM | Admit: 2023-04-04 | Discharge: 2023-04-09 | Disposition: A | Discharge status: Home / Self Care | Attending: Psychiatry | Admitting: Psychiatry

## 2023-04-04 ENCOUNTER — Other Ambulatory Visit: Payer: Self-pay

## 2023-04-04 DIAGNOSIS — E860 Dehydration: Secondary | ICD-10-CM | POA: Insufficient documentation

## 2023-04-04 DIAGNOSIS — F1520 Other stimulant dependence, uncomplicated: Secondary | ICD-10-CM | POA: Insufficient documentation

## 2023-04-04 DIAGNOSIS — Z79899 Other long term (current) drug therapy: Secondary | ICD-10-CM | POA: Insufficient documentation

## 2023-04-04 DIAGNOSIS — F17290 Nicotine dependence, other tobacco product, uncomplicated: Secondary | ICD-10-CM | POA: Insufficient documentation

## 2023-04-04 DIAGNOSIS — F17200 Nicotine dependence, unspecified, uncomplicated: Secondary | ICD-10-CM

## 2023-04-04 DIAGNOSIS — F1590 Other stimulant use, unspecified, uncomplicated: Secondary | ICD-10-CM

## 2023-04-04 DIAGNOSIS — Z720 Tobacco use: Secondary | ICD-10-CM

## 2023-04-04 DIAGNOSIS — F1510 Other stimulant abuse, uncomplicated: Secondary | ICD-10-CM

## 2023-04-04 MED ORDER — HALOPERIDOL LACTATE 5 MG/ML IJ SOLN: 10.0000 mg | Freq: Three times a day (TID) | INTRAMUSCULAR | Status: DC | PRN

## 2023-04-04 MED ORDER — DIPHENHYDRAMINE HCL 50 MG/ML IJ SOLN: 50.0000 mg | Freq: Three times a day (TID) | INTRAMUSCULAR | Status: DC | PRN

## 2023-04-04 MED ORDER — DIPHENHYDRAMINE HCL 50 MG PO CAPS: 50.0000 mg | ORAL_CAPSULE | Freq: Three times a day (TID) | ORAL | Status: DC | PRN

## 2023-04-04 MED ORDER — CLONIDINE HCL 0.1 MG PO TABS: 0.1000 mg | ORAL_TABLET | Freq: Every day | ORAL | Status: DC

## 2023-04-04 MED ORDER — LOPERAMIDE HCL 2 MG PO CAPS: 2.0000 mg | ORAL_CAPSULE | ORAL | Status: DC | PRN

## 2023-04-04 MED ORDER — METHOCARBAMOL 500 MG PO TABS
500.0000 mg | ORAL_TABLET | Freq: Three times a day (TID) | ORAL | Status: DC | PRN
  Administered 2023-04-04 – 2023-04-05 (×3): 500 mg via ORAL
  Filled 2023-04-04 (×3): qty 1

## 2023-04-04 MED ORDER — HALOPERIDOL LACTATE 5 MG/ML IJ SOLN: 5.0000 mg | Freq: Three times a day (TID) | INTRAMUSCULAR | Status: DC | PRN

## 2023-04-04 MED ORDER — ONDANSETRON 4 MG PO TBDP: 4.0000 mg | ORAL_TABLET | Freq: Four times a day (QID) | ORAL | Status: DC | PRN

## 2023-04-04 MED ORDER — CLONIDINE HCL 0.1 MG PO TABS: 0.1000 mg | ORAL_TABLET | ORAL | Status: DC

## 2023-04-04 MED ORDER — ACETAMINOPHEN 325 MG PO TABS
650.0000 mg | ORAL_TABLET | Freq: Four times a day (QID) | ORAL | Status: DC | PRN
  Administered 2023-04-04: 650 mg via ORAL
  Filled 2023-04-04: qty 2

## 2023-04-04 MED ORDER — NAPROXEN 500 MG PO TABS
500.0000 mg | ORAL_TABLET | Freq: Two times a day (BID) | ORAL | Status: DC | PRN
  Administered 2023-04-05 (×2): 500 mg via ORAL
  Filled 2023-04-04 (×2): qty 1

## 2023-04-04 MED ORDER — HYDROXYZINE HCL 25 MG PO TABS
25.0000 mg | ORAL_TABLET | Freq: Four times a day (QID) | ORAL | Status: DC | PRN
  Administered 2023-04-04 – 2023-04-05 (×2): 25 mg via ORAL
  Filled 2023-04-04 (×2): qty 1

## 2023-04-04 MED ORDER — LORAZEPAM 2 MG/ML IJ SOLN: 2.0000 mg | Freq: Three times a day (TID) | INTRAMUSCULAR | Status: DC | PRN

## 2023-04-04 MED ORDER — CLONIDINE HCL 0.1 MG PO TABS
0.1000 mg | ORAL_TABLET | Freq: Four times a day (QID) | ORAL | Status: DC
  Administered 2023-04-04 – 2023-04-05 (×2): 0.1 mg via ORAL
  Filled 2023-04-04 (×3): qty 1

## 2023-04-04 MED ORDER — HALOPERIDOL 5 MG PO TABS: 5.0000 mg | ORAL_TABLET | Freq: Three times a day (TID) | ORAL | Status: DC | PRN

## 2023-04-04 MED ORDER — TRAZODONE HCL 100 MG PO TABS
100.0000 mg | ORAL_TABLET | Freq: Once | ORAL | Status: AC
  Administered 2023-04-04: 100 mg via ORAL
  Filled 2023-04-04: qty 1

## 2023-04-04 MED ORDER — ALUM & MAG HYDROXIDE-SIMETH 200-200-20 MG/5ML PO SUSP: 30.0000 mL | ORAL | Status: DC | PRN

## 2023-04-04 MED ORDER — DICYCLOMINE HCL 20 MG PO TABS: 20.0000 mg | ORAL_TABLET | Freq: Four times a day (QID) | ORAL | Status: DC | PRN

## 2023-04-04 MED ORDER — MAGNESIUM HYDROXIDE 400 MG/5ML PO SUSP: 30.0000 mL | Freq: Every day | ORAL | Status: DC | PRN

## 2023-04-04 NOTE — ED Notes (Signed)
 Pt interacting with other pts in the group room. Dinner was given earlier. No s/sx of distress. No concerns at this time.
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
## 2023-04-04 NOTE — Progress Notes (Signed)
 Pt has been accepted to [facility] TODAY 04/04/2023 Bed assignment: Main campus  Pt meets inpatient criteria per: [name] NP  Attending Physician will be Loni Beckwith, MD  Report can be called to: [phone] (this is a pager, please leave call-back number when giving report)  Pt can arrive ASAP   Care Team Notified: Phebe Colla NP, Elza Rafter RN,[place] NT    [place] Ronaldo Crilly LCSW-A   04/04/2023 10:29 AM

## 2023-04-04 NOTE — ED Notes (Signed)
 RN went into pts room to get her for dinner, but pt is finally resting. RN letting pt sleep, will give dinner when she wakes.

## 2023-04-04 NOTE — ED Notes (Signed)
 Report given to Alex B. LPN at [facility].

## 2023-04-04 NOTE — ED Notes (Signed)
Transportation arranged with [facility].  

## 2023-04-04 NOTE — ED Notes (Signed)
 Pt has been calm and cooperative, sleeping mostly throughout the night, no aggression noted.

## 2023-04-04 NOTE — Progress Notes (Signed)
 LCSW Progress Note  [id number]   Phyllis Goodman  04/04/2023  9:53 AM  Description:   Inpatient Psychiatric Referral  Patient was recommended inpatient per [name] NP.There are no available beds at [facility], per [facility] RN). Patient was referred to the following out of network facilities:   Destination   Service Provider Address Phone Fax  [facility] [address] [place], [place] [address] [phone]  [facility] [address] [place] [address] [phone]  [facility] [address] [place] [address] [phone]  [facility] [address], [place] [address] [phone]  [facility] [address] [place] [address] [phone]  [facility] [address] [place], [place] [phone]  [facility] [address] [place] [address] [phone]  [facility] [address] [place] [address] [phone]  [facility] [address], [place] [address] [phone]  [facility] [address] [place] [address] [phone]  [facility] [address] [place], [place] [address] [phone]      Situation ongoing, CSW to continue following and update chart as more information becomes available.     [place] Linley Moskal, MSW, LCSW  04/04/2023 9:53 AM

## 2023-04-04 NOTE — ED Notes (Signed)
 Pt isolates to room, a/o. Denies SI/ Hi/ AVH. Voices c/o mild withdrawal symptoms, states she is slightly anxious. No noted distress. Will continue to monitor for safety

## 2023-04-04 NOTE — Group Note (Signed)
 Group Topic: Understanding Self  Group Date: 04/04/2023 Start Time: 2030 End Time: 2100 Facilitators: Lauro Thaila Bottoms, NT  Department: [facility]  Number of Participants: 8  Group Focus: affirmation and clarity of thought Treatment Modality:  Cognitive Behavioral Therapy Interventions utilized were clarification, leisure development, and support Purpose: express feelings, increase insight, and regain self-worth  Name: Phyllis Goodman Date of Birth: 07-11-1995  MR: [id number]    Level of Participation: active Quality of Participation: attentive and cooperative Interactions with others: gave feedback Mood/Affect: appropriate Triggers (if applicable): N/A Cognition: coherent/clear Progress: Gaining insight Response: N/A Plan: patient will be encouraged to keep attending group.  Patients Problems:  Patient Active Problem List   Diagnosis Date Noted   Methamphetamine use disorder, moderate (HCC) 04/04/2023   Methamphetamine use disorder, moderate, dependence (HCC) 04/03/2023   History of endocarditis 03/20/2022   VBAC, delivered 09/20/2019   PPH (postpartum hemorrhage) 09/20/2019   Post-dates pregnancy 09/19/2019   Group beta Strep positive 08/22/2019   History of cesarean section 06/26/2019   Prior pregnancy with congenital cardiac defect, antepartum 06/26/2019   Rh negative state in antepartum period 06/17/2019   Supervision of other normal pregnancy, antepartum 05/14/2019   History of drug abuse (HCC) 07/28/2015   ADHD (attention deficit hyperactivity disorder), combined type 06/28/2015   Dysgraphia 06/28/2015

## 2023-04-04 NOTE — ED Notes (Signed)
 Pt resting in dayroom, watching tv. No s/sx of distress. No further concerns.

## 2023-04-04 NOTE — ED Notes (Signed)
 Report given.

## 2023-04-04 NOTE — ED Provider Notes (Signed)
 Emergency Medicine Observation Re-evaluation Note  Phyllis Goodman is a 28 y.o. female, seen on rounds today.  Pt initially presented to the ED for complaints of Drug Overdose Currently, the patient is asleep, no new concerns by nursing staff.  Physical Exam  BP 112/74   Pulse (!) 104   Temp (!) 97.4 F (36.3 C) (Oral)   Resp 18   SpO2 100%  Physical Exam General: Asleep, no acute distress Cardiac: Regular rate Lungs: No increased WOB Psych: Calm, asleep  ED Course / MDM  EKG:EKG Interpretation Date/Time:  Tuesday April 03 2023 01:24:23 EDT Ventricular Rate:  104 PR Interval:  142 QRS Duration:  95 QT Interval:  357 QTC Calculation: 470 R Axis:   72  Text Interpretation: Sinus tachycardia Confirmed by Alona Bene [phone]) on 04/03/2023 4:21:11 AM  I have reviewed the labs performed to date as well as medications administered while in observation.  Recent changes in the last 24 hours include patient remains medically cleared. Evaluated by psych and recommended transfer to [facility].  Plan  Current plan is for admission to [facility].    Rexford Maus, DO 04/04/23 [phone]

## 2023-04-05 MED ORDER — CLONIDINE HCL 0.1 MG PO TABS: 0.1000 mg | ORAL_TABLET | Freq: Every day | ORAL | Status: DC

## 2023-04-05 MED ORDER — ADULT MULTIVITAMIN W/MINERALS CH
1.0000 | ORAL_TABLET | Freq: Every day | ORAL | Status: DC
  Administered 2023-04-05 – 2023-04-09 (×5): 1 via ORAL
  Filled 2023-04-05 (×5): qty 1

## 2023-04-05 MED ORDER — LORAZEPAM 1 MG PO TABS: 1.0000 mg | ORAL_TABLET | Freq: Four times a day (QID) | ORAL | Status: AC | PRN

## 2023-04-05 MED ORDER — TRAZODONE HCL 100 MG PO TABS
100.0000 mg | ORAL_TABLET | Freq: Every evening | ORAL | Status: DC | PRN
  Administered 2023-04-05 – 2023-04-08 (×4): 100 mg via ORAL
  Filled 2023-04-05 (×4): qty 1

## 2023-04-05 MED ORDER — BUPRENORPHINE HCL-NALOXONE HCL 2-0.5 MG SL SUBL: 1.0000 | SUBLINGUAL_TABLET | Freq: Two times a day (BID) | SUBLINGUAL | Status: DC

## 2023-04-05 MED ORDER — BUPRENORPHINE HCL-NALOXONE HCL 2-0.5 MG SL SUBL
1.0000 | SUBLINGUAL_TABLET | Freq: Two times a day (BID) | SUBLINGUAL | Status: DC
  Administered 2023-04-05 – 2023-04-09 (×8): 1 via SUBLINGUAL
  Filled 2023-04-05 (×8): qty 1

## 2023-04-05 MED ORDER — MIRTAZAPINE 15 MG PO TABS
45.0000 mg | ORAL_TABLET | Freq: Every day | ORAL | Status: DC
  Administered 2023-04-05 – 2023-04-08 (×4): 45 mg via ORAL
  Filled 2023-04-05 (×4): qty 3

## 2023-04-05 MED ORDER — CLONIDINE HCL 0.1 MG PO TABS: 0.1000 mg | ORAL_TABLET | Freq: Four times a day (QID) | ORAL | Status: DC

## 2023-04-05 MED ORDER — CLONIDINE HCL 0.1 MG PO TABS: 0.1000 mg | ORAL_TABLET | ORAL | Status: DC

## 2023-04-05 MED ORDER — NICOTINE 14 MG/24HR TD PT24
14.0000 mg | MEDICATED_PATCH | Freq: Every day | TRANSDERMAL | Status: DC
  Filled 2023-04-05 (×5): qty 1

## 2023-04-05 NOTE — Tx Team (Signed)
 LCSW and Resident met with patient at bedside to assess current mood, affect, physical state, and inquire about needs/goals while here in [facility] and after discharge. Patient reports she presented due to needing to get off drugs.  Patient reports a recent return to meth use after being clean for 2 years. Patient reports she has been using about $20 worth of meth for the last couple of weeks.  Patient denies daily use stating $20 last a couple of days.  Patient reports maintaining sobriety due to working and being with family. 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  Patient reports having support from her mom and dad, however reports she is not interested in collateral being gained from parents at this time. Patient reports tobacco use via vaping and reports a cartridge lasts her about a month. Patient denies any alcohol use.  Patient denies any inpatient substance abuse treatment in the past.  Patient denies ever being diagnosed with any mental health diagnoses in the past.  Patient reports she is being seen by [facility] for medication management and reported being on trazadone.  Patient denies any legal charges or upcoming court dates. Patient reports she has 2 younger children ages 85 and 2 that are currently residing with her parents. Patient denies substance use in the presence of the children.  Patient denies access to any firearms or weapons in the home.  Patient reports a history of alcohol abuse in dad. Patient reports her current goal at this time is to detox from substances and follow up with intensive outpatient substance abuse services at discharge. Patient aware that LCSW will follow up with outpatient providers at the [facility] to arrange aftercare follow up.  LCSW will follow up to provide updates once received.  No other needs were reported by the patient at this time.  LCSW will continue to follow and  provide support to the patient while on [facility] unit.Fernande Boyden, LCSW Clinical Social Worker [facility] Ph: [phone]

## 2023-04-05 NOTE — Group Note (Signed)
 Group Topic: Relapse and Recovery  Group Date: 04/05/2023 Start Time: 0800 End Time: 2051 Facilitators: Quinn Axe, NT  Department: [facility]  Number of Participants: 6  Group Focus: chemical dependency education, discharge education, and feeling awareness/expression Treatment Modality:  Cognitive Behavioral Therapy Interventions utilized were clarification, confrontation, patient education, and story telling Purpose: express feelings, increase insight, reinforce self-care, and relapse prevention strategies  Name: Phyllis Goodman Date of Birth: 1995-09-22  MR: [id number]    Level of Participation: active Quality of Participation: attentive and cooperative Interactions with others: gave feedback Mood/Affect: appropriate Triggers (if applicable):  none Cognition: coherent/clear and insightful Progress: Gaining insight Response:  Pt feels that with the support of her family she can overcome and work through her substance issues. Plan: patient will be encouraged to attend all scheduled activities on the unit.  Patients Problems:  Patient Active Problem List   Diagnosis Date Noted   Methamphetamine use disorder, moderate (HCC) 04/04/2023   Methamphetamine use disorder, moderate, dependence (HCC) 04/03/2023   History of endocarditis 03/20/2022   VBAC, delivered 09/20/2019   PPH (postpartum hemorrhage) 09/20/2019   Post-dates pregnancy 09/19/2019   Group beta Strep positive 08/22/2019   History of cesarean section 06/26/2019   Prior pregnancy with congenital cardiac defect, antepartum 06/26/2019   Rh negative state in antepartum period 06/17/2019   Supervision of other normal pregnancy, antepartum 05/14/2019   History of drug abuse (HCC) 07/28/2015   ADHD (attention deficit hyperactivity disorder), combined type 06/28/2015   Dysgraphia 06/28/2015

## 2023-04-05 NOTE — ED Notes (Signed)
 Patient was provided breakfast

## 2023-04-05 NOTE — ED Notes (Signed)
 Patient in the dayroom pleasant , watching TV with other patients. NAD.  Respirations are even and unlabored.  Will continue to monitor for safety

## 2023-04-05 NOTE — Group Note (Signed)
 Group Topic: Wellness  Group Date: 04/05/2023 Start Time: 1145 End Time: 1200 Facilitators: Wonda Cheng, LPN  Department: [facility]  Number of Participants: 8  Group Focus: nursing group Treatment Modality:  Patient-Centered Therapy Interventions utilized were group exercise and support Purpose: increase insight  Name: Phyllis Goodman Date of Birth: [birth year]  MR: [id number]    Level of Participation: minimal Quality of Participation: withdrawn Interactions with others: no interactions made Mood/Affect: depressed and flat Triggers (if applicable): n/a Cognition: coherent/clear and no insight Progress: Minimal Response: response appropriate to situation, but could interact more in group Plan: patient will be encouraged to continue learned coping skills as well as attend group sessions  Patients Problems:  Patient Active Problem List   Diagnosis Date Noted   Methamphetamine use disorder, moderate (HCC) 04/04/2023   Methamphetamine use disorder, moderate, dependence (HCC) 04/03/2023   History of endocarditis 03/20/2022   VBAC, delivered 09/20/2019   PPH (postpartum hemorrhage) 09/20/2019   Post-dates pregnancy 09/19/2019   Group beta Strep positive 08/22/2019   History of cesarean section 06/26/2019   Prior pregnancy with congenital cardiac defect, antepartum 06/26/2019   Rh negative state in antepartum period 06/17/2019   Supervision of other normal pregnancy, antepartum 05/14/2019   History of drug abuse (HCC) 07/28/2015   ADHD (attention deficit hyperactivity disorder), combined type 06/28/2015   Dysgraphia 06/28/2015

## 2023-04-05 NOTE — ED Notes (Signed)
Pt observed/assessed in room sleeping. RR even and unlabored, appearing in no noted distress. Environmental check complete, will continue to monitor for safety 

## 2023-04-05 NOTE — Group Note (Signed)
 Group Topic: Spirituality in Recovery  Group Date: 04/05/2023 Start Time: 1045 End Time: 1150 Facilitators: Loleta Dicker, LCSW  Department: [facility]  Number of Participants: 4  Group Focus: clarity of thought, communication, reality orientation, and self-awareness Treatment Modality:  Cognitive Behavioral Therapy and Spiritual Interventions utilized were story telling and support Purpose: express feelings, increase insight, regain self-worth, and reinforce self-care  Name: Phyllis Goodman Date of Birth: 07/13/1995  MR: [id number]    Level of Participation: Patient did not attend group on today. Patient has been encouraged to participate in all programming on unit.   Patients Problems:  Patient Active Problem List   Diagnosis Date Noted   Methamphetamine use disorder, moderate (HCC) 04/04/2023   Methamphetamine use disorder, moderate, dependence (HCC) 04/03/2023   History of endocarditis 03/20/2022   VBAC, delivered 09/20/2019   PPH (postpartum hemorrhage) 09/20/2019   Post-dates pregnancy 09/19/2019   Group beta Strep positive 08/22/2019   History of cesarean section 06/26/2019   Prior pregnancy with congenital cardiac defect, antepartum 06/26/2019   Rh negative state in antepartum period 06/17/2019   Supervision of other normal pregnancy, antepartum 05/14/2019   History of drug abuse (HCC) 07/28/2015   ADHD (attention deficit hyperactivity disorder), combined type 06/28/2015   Dysgraphia 06/28/2015

## 2023-04-05 NOTE — ED Notes (Signed)
 Pt resting in bed at the current c eyes closed. No s/s of acute distress, pain or any discomfort at this time. VSS. Safety maintained. Will continue to monitor and report any COC.
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
## 2023-04-05 NOTE — ED Notes (Signed)
 Pt resting quietly in bed, breathing is even and unlabored. Pt denies SI, HI, and AVH. C/o of aching pain all over body, PRN medication administered. Will continue to monitor for safety and report any COC.

## 2023-04-05 NOTE — ED Notes (Signed)
 Pt at nurses station with c/o withdrawal symptoms. She is anxious, sweating, chills,pacing, irritable with slight tremors. Provider notified, requested PRN med orders. Will continue to monitor for safety

## 2023-04-05 NOTE — Discharge Instructions (Signed)
 [facility] [address], [place], [address] [phone] phone  New Patient Assessment/Therapy Walk-Ins:  Monday and Wednesday: 8 am until slots are full. Every 1st and 2nd Fridays of the month: 1 pm - 5 pm.  NO ASSESSMENT/THERAPY WALK-INS ON TUESDAYS OR THURSDAYS  New Patient Assessment/Medication Management Walk-Ins:  Monday - Friday:  8 am - 11 am.  For all walk-ins, we ask that you arrive by 7:30 am because patients will be seen in the order of arrival.  Availability is limited; therefore, you may not be seen on the same day that you walk-in.  Our goal is to serve and meet the needs of our community to the best of our [place] ability.  SUBSTANCE USE TREATMENT for Medicaid and State Funded/IPRS  [facility] [address], [place], [address] [phone] phone NOTE: [facility] is no longer offering IOP services.  Serves those who are low-income or have no insurance.  [facility] [address] [place], [place], [address] [phone] phone [phone] fax NOTE: Does have Substance Abuse-Intensive Outpatient Program [facility]) as well as transitional housing if eligible.  [facility] [address] [place], [place], [address] [phone] phone [phone] fax  [facility] [phone] [place], [place], [address] [phone] phone [phone] fax  HALFWAY HOUSES:  [facility] [phone]  [employer].oxfordvacancies.com  12 STEP PROGRAMS:  Alcoholics Anonymous of [place] Software[employer]et.be  Narcotics Anonymous of [place] [employer]ct.dk  Al-Anon of [place], [place] www.[place]alanon.org/find-meetings.html  Nar-Anon https://nar-anon.org/find-a-meetin  List of Residential placements:   [facility] in [place]: [phone]  [facility]: [phone]  [place], [place]  [phone]: Female and female facility; 30-day program: (uninsured and Medicaid such as Laurena Bering, [place], [place], partners)  [facility]: [phone]; men and women's facility; 28 days; Can have Medicaid tailored plan Tour manager or Partners)  [facility]: [phone] Karoline Caldwell or Larita Fife; 28 day program; must be fully detox; tailored Medicaid or no insurance  [address] in [place], [place]; [phone]; 28 day all males program; no insurance accepted  [facility] in [place]: Gabriel Rung [phone] (no insurance or Medicaid only); 90 days; outpatient services but provide housing in apartments [place]  [facility] Admission: [phone]: Patient must complete phone screening for placement: [place], [place]; 6 month program; uninsured, Medicaid, and [employer].   [facility]: no insurance required; [phone]  [facility]: [phone]; Intake: Molly Maduro; Must fill out application online; Alecia Lemming Delay [phone] x [address] [facility] in [place], [place]: [phone]; Admissions Coordinators Mr. Maurine Minister or Barron Alvine; 90 day program.  [facility]: [place], [place] [phone]; Co-Ed 9 month to a year program; Online application; Men entry fee is $500 (6-53months);  [employer]: [address] [place], [place] [address]; no fee or insurance required; minimum of 2 years; Highly structured; work based; Intake Coordinator is Thayer Ohm [phone]  [facility] in [place], [place]: [phone]; Fax number is [phone]; website: www.[facility].org; Requires 3-6 page autobiography; 2 year program (18 months and then 25month transitional housing); Admission fee is $300; no insurance needed; work Automotive engineer in [place], [place]: [employer] Desk Staff: Danise Edge [phone]: They have a Men's Regenerations Program 6-39months. Free program; There is an initial $300 fee however, they are willing to work  with patients regarding that. Application is online.  [facility]: Admissions [phone] Doran Heater ext [phone]; Any 7-90 day program is out of pocket; 12  month program is free of charge; there is a $275 entry fee; Patient is responsible for own transportation

## 2023-04-06 LAB — COMPREHENSIVE METABOLIC PANEL
ALT: 14 U/L (ref 0–44)
AST: 15 U/L (ref 15–41)
Albumin: 2.7 g/dL — ABNORMAL LOW (ref 3.5–5.0)
Alkaline Phosphatase: 66 U/L (ref 38–126)
Anion gap: 3 — ABNORMAL LOW (ref 5–15)
BUN: 10 mg/dL (ref 6–20)
CO2: 25 mmol/L (ref 22–32)
Calcium: 8.4 mg/dL — ABNORMAL LOW (ref 8.9–10.3)
Chloride: 114 mmol/L — ABNORMAL HIGH (ref 98–111)
Creatinine, Ser: 0.81 mg/dL (ref 0.44–1.00)
GFR, Estimated: >60 mL/min (ref >60)
Glucose, Bld: 82 mg/dL (ref 70–99)
Potassium: 3.7 mmol/L (ref 3.5–5.1)
Sodium: 142 mmol/L (ref 135–145)
Total Bilirubin: 0.5 mg/dL (ref 0.0–1.2)
Total Protein: 5.6 g/dL — ABNORMAL LOW (ref 6.5–8.1)

## 2023-04-06 LAB — CK: Total CK: 87 U/L (ref 38–234)

## 2023-04-06 NOTE — ED Notes (Signed)
 Patient resting with eyes closed in no apparent acute distress. Respirations even and unlabored. Environment secured. Safety checks in place according to facility policy.

## 2023-04-06 NOTE — ED Notes (Signed)
 Pt is in the bedroom sleeping comfortably and stable as well. Will continue to monitor for safety.

## 2023-04-06 NOTE — ED Provider Notes (Signed)
 Behavioral Health Progress Note  Date and Time: 04/06/2023 10:41 AM Name: Phyllis Goodman MRN:  [id number]  Subjective:   Patient evaluated at bedside.  Reports both sleep and appetite are adequate states mood is "food" today. She reports no specific goals for today.Denies any cravings today. On interview, suicidal ideations are not present. Thoughts of self harm are not present. Homicidal ideations are not present.   There are no auditory hallucinations, visual hallucinations, paranoid ideations, or delusional thought processes.   Side effects to currently prescribed medications are none. There are no somatic complaints. Reports regular bowel movements.. There are no withdrawals reported today.      Diagnosis:  Final diagnoses:  Stimulant use disorder  Methamphetamine abuse (HCC)  Tobacco use disorder  Vapes nicotine containing substance    Total Time spent with patient: 1.5 hours  Contacted [facility] at [phone] on 04/05/23 Confirmed patient was last prescribed Zubsolv (buprenorphine 5.7 mg 3 times daily) in January 2025, with 30-day supply.  They confirm the patient has not had any other follow-up appointments since then, has canceled appointments multiple times.  They also confirm the patient has active prescription for Remeron 60 mg nightly and trazodone 150 mg nightly.  The patient is welcome to arrange appointment and reestablish care at any time.   Interim Note Following the phone call with [facility], I spoke with the patient.  She does admit that she has been noncompliant with her prescription of Zubsolv, and was attempting to stretch out her 30 day prescription from January to last her multiple months. She admits to skipping 2-3 days at a time. She continued to deny any withdrawal symptoms from opioids. She is amenable to restarting a low dose of suboxone while here and agrees to have follow up appointment with [facility] at discharge.    Substance Use  Hx: Alcohol: denies Tobacco: vapes, 1 cartidge last 1 month. Has been using for the past 3 years.  Cannabis:denies Cocaine: denies Methamphetamines: started using around age 28.  Patient is unable to specify quantity, denies daily use, but reports she will spend $20 on methamphetamines weekly Psilocybin (mushrooms): denies Ecstasy (MDMA / molly): denies LSD (acid): never tried: denies Opiates (fentanyl / heroin): denies, chart review shows patient has previously been prescribed Suboxone and UDS is + for opioids. Patient denies any illicit use of opiods including heroine, oxycodone, or fentanyl.  Benzos (Xanax, Klonopin): denies IVDU: Reports prior history of IV drug use, denies any recent use   Past Psychiatric Hx: Patient reports she follows up with [facility] for medication management.  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 Reports she never used arundher children Legal:no  upcoming court  dates Military:denies Access to firearms: denies   Current Medications:  Current Facility-Administered Medications  Medication Dose Route Frequency Provider Last Rate Last Admin   acetaminophen (TYLENOL) tablet 650 mg  650 mg Oral Q6H PRN Weber, Kyra A, NP   650 mg at 04/04/23 1858   alum & mag hydroxide-simeth (MAALOX/MYLANTA) 200-200-20 MG/5ML suspension 30 mL  30 mL Oral Q4H PRN Weber, Kyra A, NP       buprenorphine-naloxone (SUBOXONE) 2-0.5 mg per SL tablet 1 tablet  1 tablet Sublingual Q12H Carrion-Carrero, Hawken Bielby, MD   1 tablet at 04/06/23 0944   dicyclomine (BENTYL) tablet 20 mg  20 mg Oral Q6H PRN Sindy Guadeloupe, NP       haloperidol (HALDOL) tablet 5 mg  5 mg Oral TID PRN Weber, Bella Kennedy A, NP       And   diphenhydrAMINE (BENADRYL) capsule 50 mg  50 mg Oral TID PRN Weber, Kyra A, NP       haloperidol lactate (HALDOL) injection 5 mg  5 mg Intramuscular TID PRN Weber, Kyra A, NP       And   diphenhydrAMINE (BENADRYL) injection 50 mg  50 mg Intramuscular TID PRN Weber, Bella Kennedy A, NP       And   LORazepam (ATIVAN) injection 2 mg  2 mg Intramuscular TID PRN Weber, Bella Kennedy A, NP       haloperidol lactate (HALDOL) injection 10 mg  10 mg Intramuscular TID PRN Weber, Kyra A, NP       And   diphenhydrAMINE (BENADRYL) injection 50 mg  50 mg Intramuscular TID PRN Weber, Bella Kennedy A, NP       And   LORazepam (ATIVAN) injection 2 mg  2 mg Intramuscular TID PRN Weber, Bella Kennedy A, NP       hydrOXYzine (ATARAX) tablet 25 mg  25 mg Oral Q6H PRN Sindy Guadeloupe, NP   25 mg at 04/05/23 0950   loperamide (IMODIUM) capsule 2-4 mg  2-4 mg Oral PRN Sindy Guadeloupe, NP       LORazepam (ATIVAN) tablet 1 mg  1 mg Oral Q6H PRN Carrion-Carrero, Tarin Johndrow, MD       magnesium hydroxide (MILK OF MAGNESIA) suspension 30 mL  30 mL Oral Daily PRN Weber, Kyra A, NP       methocarbamol (ROBAXIN) tablet 500 mg  500 mg Oral Q8H PRN Sindy Guadeloupe, NP   500 mg at 04/05/23 2117   mirtazapine (REMERON) tablet 45 mg  45 mg Oral QHS  Carrion-Carrero, Asuka Dusseau, MD   45 mg at 04/05/23 2117   multivitamin with minerals tablet 1 tablet  1 tablet Oral Daily Carrion-Carrero, Roderick Calo, MD   1 tablet at 04/06/23 0944   naproxen (NAPROSYN) tablet 500 mg  500 mg Oral BID PRN Sindy Guadeloupe, NP   500 mg at 04/05/23 2118   nicotine (NICODERM CQ - dosed in mg/24 hours) patch 14 mg  14 mg Transdermal Daily Carrion-Carrero, Tyna Huertas, MD       ondansetron (ZOFRAN-ODT) disintegrating tablet 4 mg  4 mg Oral Q6H PRN Sindy Guadeloupe, NP       traZODone (DESYREL) tablet 100 mg  100 mg Oral QHS PRN Carrion-Carrero, Karle Starch, MD   100 mg at 04/05/23 2119   Current Outpatient Medications  Medication Sig Dispense Refill   hydrOXYzine (VISTARIL) 25 MG capsule Take 25-50 mg by mouth daily as needed. (Patient taking differently: Take 50 mg by mouth daily as needed.)     mirtazapine (REMERON) 45 MG tablet  Take 45 mg by mouth at bedtime.     traZODone (DESYREL) 100 MG tablet Take 100 mg by mouth at bedtime as needed.     ZUBSOLV 5.7-1.4 MG SUBL Take 1 tablet by mouth 3 (three) times daily.     SUBOXONE 2-0.5 MG FILM Place under the tongue. (Patient not taking: Reported on 04/05/2023)      Labs  Lab Results:  Admission on 04/04/2023  Component Date Value Ref Range Status   Total CK 04/06/2023 87  38 - 234 U/L Final   Performed at [facility], [address], [place], [place] [address]   Sodium 04/06/2023 142  135 - 145 mmol/L Final   Potassium 04/06/2023 3.7  3.5 - 5.1 mmol/L Final   Chloride 04/06/2023 114 (H)  98 - 111 mmol/L Final   CO2 04/06/2023 25  22 - 32 mmol/L Final   Glucose, Bld 04/06/2023 82  70 - 99 mg/dL Final   Glucose reference range applies only to samples taken after fasting for at least 8 hours.   BUN 04/06/2023 10  6 - 20 mg/dL Final   Creatinine, Ser 04/06/2023 0.81  0.44 - 1.00 mg/dL Final   Calcium 78/29/5621 8.4 (L)  8.9 - 10.3 mg/dL Final   Total Protein 30/86/5784 5.6 (L)  6.5 - 8.1 g/dL Final   Albumin 69/62/9528 2.7  (L)  3.5 - 5.0 g/dL Final   AST 41/32/4401 15  15 - 41 U/L Final   ALT 04/06/2023 14  0 - 44 U/L Final   Alkaline Phosphatase 04/06/2023 66  38 - 126 U/L Final   Total Bilirubin 04/06/2023 0.5  0.0 - 1.2 mg/dL Final   GFR, Estimated 04/06/2023 >60  >60 mL/min Final   Comment: (NOTE) Calculated using the CKD-EPI Creatinine Equation (2021)    Anion gap 04/06/2023 3 (L)  5 - 15 Final   Performed at [facility], [address] [place], [place] [address]  Admission on 04/03/2023, Discharged on 04/04/2023  Component Date Value Ref Range Status   Glucose-Capillary 04/03/2023 173 (H)  70 - 99 mg/dL Final   Glucose reference range applies only to samples taken after fasting for at least 8 hours.   Sodium 04/03/2023 137  135 - 145 mmol/L Final   Potassium 04/03/2023 3.6  3.5 - 5.1 mmol/L Final   Chloride 04/03/2023 106  98 - 111 mmol/L Final   CO2 04/03/2023 24  22 - 32 mmol/L Final   Glucose, Bld 04/03/2023 159 (H)  70 - 99 mg/dL Final   Glucose reference range applies only to samples taken after fasting for at least 8 hours.   BUN 04/03/2023 17  6 - 20 mg/dL Final   Creatinine, Ser 04/03/2023 1.01 (H)  0.44 - 1.00 mg/dL Final   Calcium 36/64/4034 8.3 (L)  8.9 - 10.3 mg/dL Final   Total Protein 74/25/9563 6.3 (L)  6.5 - 8.1 g/dL Final   Albumin 87/56/4332 3.2 (L)  3.5 - 5.0 g/dL Final   AST 95/18/8416 28  15 - 41 U/L Final   ALT 04/03/2023 15  0 - 44 U/L Final   Alkaline Phosphatase 04/03/2023 79  38 - 126 U/L Final   Total Bilirubin 04/03/2023 0.5  0.0 - 1.2 mg/dL Final   GFR, Estimated 04/03/2023 >60  >60 mL/min Final   Comment: (NOTE) Calculated using the CKD-EPI Creatinine Equation (2021)    Anion gap 04/03/2023 7  5 - 15 Final   Performed at [facility], [address] [place], [place] [address]   Lipase 04/03/2023 25  11 - 51 U/L Final   Performed at [facility], [address] [place], [place] [address]   WBC 04/03/2023 23.3 (H)   4.0 - 10.5 K/uL Final   RBC 04/03/2023 3.71 (L)  3.87 - 5.11 MIL/uL Final   Hemoglobin 04/03/2023 11.5 (L)  12.0 - 15.0 g/dL Final   HCT 34/74/2595 33.8 (L)  36.0 - 46.0 % Final   MCV 04/03/2023 91.1  80.0 - 100.0 fL Final   MCH 04/03/2023 31.0  26.0 - 34.0 pg Final   MCHC 04/03/2023 34.0  30.0 - 36.0 g/dL Final   RDW 63/87/5643 12.3  11.5 - 15.5 % Final   Platelets 04/03/2023 387  150 - 400 K/uL Final   nRBC 04/03/2023 0.0  0.0 - 0.2 % Final   Neutrophils Relative % 04/03/2023 82  % Final   Neutro Abs 04/03/2023 19.3 (H)  1.7 - 7.7 K/uL Final   Lymphocytes Relative 04/03/2023 10  % Final   Lymphs Abs 04/03/2023 2.2  0.7 - 4.0 K/uL Final   Monocytes Relative 04/03/2023 5  % Final   Monocytes Absolute 04/03/2023 1.1 (H)  0.1 - 1.0 K/uL Final   Eosinophils Relative 04/03/2023 2  % Final   Eosinophils Absolute 04/03/2023 0.5  0.0 - 0.5 K/uL Final   Basophils Relative 04/03/2023 0  % Final   Basophils Absolute 04/03/2023 0.1  0.0 - 0.1 K/uL Final   Immature Granulocytes 04/03/2023 1  % Final   Abs Immature Granulocytes 04/03/2023 0.13 (H)  0.00 - 0.07 K/uL Final   Performed at [facility], [address], [place], [place] [address]   Opiates 04/03/2023 POSITIVE (A)  NONE DETECTED Final   Cocaine 04/03/2023 NONE DETECTED  NONE DETECTED Final   Benzodiazepines 04/03/2023 POSITIVE (A)  NONE DETECTED Final   Amphetamines 04/03/2023 POSITIVE (A)  NONE DETECTED Final   Comment: (NOTE) Trazodone is metabolized in vivo to several metabolites, including pharmacologically active m-CPP, which is excreted in the urine. Immunoassay screens for amphetamines and MDMA have potential cross-reactivity with these compounds and may provide false positive  results.     Tetrahydrocannabinol 04/03/2023 NONE DETECTED  NONE DETECTED Final   Barbiturates 04/03/2023 NONE DETECTED  NONE DETECTED Final   Comment: (NOTE) DRUG SCREEN FOR MEDICAL PURPOSES ONLY.  IF CONFIRMATION IS NEEDED FOR  ANY PURPOSE, NOTIFY LAB WITHIN 5 DAYS.  LOWEST DETECTABLE LIMITS FOR URINE DRUG SCREEN Drug Class                     Cutoff (ng/mL) Amphetamine and metabolites    1000 Barbiturate and metabolites    200 Benzodiazepine                 200 Opiates and metabolites        300 Cocaine and metabolites        300 THC                            50 Performed at [facility], [address], [place], [place] [address]    Alcohol, Ethyl (B) 04/03/2023 <10  <10 mg/dL Final   Comment: (NOTE) Lowest detectable limit for serum alcohol is 10 mg/dL.  For medical purposes only. Performed at [facility], [address] [place], [place] [address]    Preg, Serum 04/03/2023  NEGATIVE  NEGATIVE Final   Comment:        THE SENSITIVITY OF THIS METHODOLOGY IS >10 mIU/mL. Performed at [facility], [address], [place], [place] [address]    Glucose-Capillary 04/03/2023 67 (L)  70 - 99 mg/dL Final   Glucose reference range applies only to samples taken after fasting for at least 8 hours.   Total CK 04/03/2023 654 (H)  38 - 234 U/L Final   Performed at [facility], [address], [place], [place] [address]   Glucose-Capillary 04/03/2023 157 (H)  70 - 99 mg/dL Final   Glucose reference range applies only to samples taken after fasting for at least 8 hours.   Glucose-Capillary 04/03/2023 74  70 - 99 mg/dL Final   Glucose reference range applies only to samples taken after fasting for at least 8 hours.   Total CK 04/03/2023 713 (H)  38 - 234 U/L Final   Performed at [facility], [address], [place], [place] [address]   Glucose-Capillary 04/03/2023 81  70 - 99 mg/dL Final   Glucose reference range applies only to samples taken after fasting for at least 8 hours.   Glucose-Capillary 04/03/2023 109 (H)  70 - 99 mg/dL Final   Glucose reference range applies only to samples taken after fasting for at least  8 hours.    Blood Alcohol level:  Lab Results  Component Value Date   ETH <10 04/03/2023    Metabolic Disorder Labs: Lab Results  Component Value Date   HGBA1C 5.2 05/22/2022   No results found for: "PROLACTIN" Lab Results  Component Value Date   CHOL 110 05/22/2022   TRIG 50.0 05/22/2022   HDL 38.70 (L) 05/22/2022   CHOLHDL 3 05/22/2022   VLDL 10.0 05/22/2022   LDLCALC 62 05/22/2022    Therapeutic Lab Levels: No results found for: "LITHIUM" No results found for: "VALPROATE" No results found for: "CBMZ"  Physical Findings   GAD-7    Flowsheet Row Office Visit from 11/21/2022 in [facility] [employer] Visit from 05/22/2022 in [facility] [employer] Visit from 03/20/2022 in [facility] [facility] [employer] Visit from 04/17/2019 in [facility] [place]  Total GAD-7 Score 4 7 7  0      PHQ2-9    Flowsheet Row ED from 04/04/2023 in [facility] Office Visit from 11/21/2022 in [facility] [place] Office Visit from 05/22/2022 in [facility] [employer] [facility] [employer] from 03/20/2022 in [facility] [employer] [facility] [employer] from 04/17/2019 in [facility] Of [place]. [facility]  PHQ-2 Total Score 0 4 4 1  0  PHQ-9 Total Score -- 8 10 9  0      Flowsheet Row ED from 04/04/2023 in [facility] ED from 04/03/2023 in [facility] Emergency Department at [facility] ED from 03/20/2023 in [facility]  C-SSRS RISK CATEGORY No Risk No Risk No Risk        Musculoskeletal  Strength & Muscle Tone: within normal limits Gait & Station: normal Patient leans: N/A  Psychiatric Specialty Exam  Presentation  General Appearance:  Appropriate  for Environment  Eye Contact: Good  Speech: Clear  and Coherent; Normal Rate  Speech Volume: Normal  Handedness: -- (not assessed)   Mood and Affect  Mood: -- ("Good")  Affect: Congruent; Full Range   Thought Process  Thought Processes: Linear  Descriptions of Associations:Intact  Orientation:None  Thought Content:Logical     Hallucinations:Hallucinations: None  Ideas of Reference:None  Suicidal Thoughts:Suicidal Thoughts: No  Homicidal Thoughts:Homicidal Thoughts: No   Sensorium  Memory: Immediate Good; Recent Good; Remote Good  Judgment: Fair  Insight: Fair   Chartered certified accountant: Fair  Attention Span: Fair  Recall: Fair  Fund of Knowledge: Fair  Language: Fair   Psychomotor Activity  Psychomotor Activity: Psychomotor Activity: Normal   Assets  Assets: Desire for Improvement; Resilience; Communication Skills   Sleep  Sleep: Sleep: Fair   Nutritional Assessment (For OBS and FBC admissions only) Has the patient had a weight loss or gain of 10 pounds or more in the last 3 months?: No Has the patient had a decrease in food intake/or appetite?: No Does the patient have dental problems?: No Does the patient have eating habits or behaviors that may be indicators of an eating disorder including binging or inducing vomiting?: No Has the patient recently lost weight without trying?: 0 Has the patient been eating poorly because of a decreased appetite?: 0 Malnutrition Screening Tool Score: 0    Physical Exam  Physical Exam Vitals and nursing note reviewed.  Constitutional:      General: She is not in acute distress.    Appearance: Normal appearance. She is not ill-appearing.  HENT:     Head: Normocephalic and atraumatic.  Eyes:     Extraocular Movements: Extraocular movements intact.     Conjunctiva/sclera: Conjunctivae normal.  Pulmonary:     Effort: Pulmonary effort is normal. No respiratory distress.   Musculoskeletal:        General: No swelling. Normal range of motion.  Skin:    General: Skin is warm and dry.  Neurological:     General: No focal deficit present.     Mental Status: She is alert.    Review of Systems  All other systems reviewed and are negative.  Blood pressure 102/65, pulse 80, temperature 98.4 F (36.9 C), temperature source Oral, resp. rate 18, SpO2 98%. There is no height or weight on file to calculate BMI.  Treatment Plan Summary: Daily contact with patient to assess and evaluate symptoms and progress in treatment  R/o Benzodiazepine use --CIWA (started as a precaution, can likely be discontinued by tomorrow) --symptom triggered therapy   Stimulant use disorder, Methamphetamine --Counseling given, cessation encouraged --Supportive PRNs   Opioid use disorder Last COWS score was 4 at 6:29 AM on 3/14 PDMP confirmed last prescription of zubsolv (buprenorphine 7.5 mg TID) on Jan 7 -Start Suboxone (buprenorphine 2 mg) every 12 hours -Tylenol 650 mg every 6 hours as needed for mild pain -Naproxen 500 mg BID as needed for pain -Bentyl 20 mg every 6 hours as needed for spasms/abdominal cramping -Robaxin 500 mg every 8 hours as needed for muscle spasms -Zofran 4 mg every 6 hours as needed for nausea or vomiting -Imodium 2 to 4 mg as needed for diarrhea or loose stools -Maalox/Mylanta 30 mL every 4 hours as needed for indigestion -Milk of Mag 30 mL as needed for constipation     Diagnosis of depression by history Patient denying any symptoms of depression on interview, patient requests medications be restarted. --Continue Remeron at 45 mg daily --Continue trazodone at 100 mg nightly as  needed for insomnia   Tobacco use disorder -- Smoking cessation encouraged -- Nicotine patch 40 mg daily   Downtrending CK CK: [phone] Repeat CMP showing downtrending albumin 3.2 > 2.7, total protein 6.3 > 5.6    Signed: Lorri Frederick, MD 04/06/2023  10:41 AM

## 2023-04-06 NOTE — ED Notes (Signed)
 Patient sitting in bedroom, calm and composed. No acute distress noted. No concerns voiced. Informed patient to notify staff with any needs or assistance. Patient verbalized understanding or agreement. Safety checks in place per facility policy.

## 2023-04-06 NOTE — ED Notes (Signed)
 Patient alert & oriented x4. Denies intent to harm self or others when asked. Denies A/VH. Patient denies any physical complaints when asked. No acute distress noted. Scheduled medications administered with no complications. Support and encouragement provided. Routine safety checks conducted per facility protocol. Encouraged patient to notify staff if any thoughts of harm towards self or others arise. Patient verbalizes understanding and agreement.

## 2023-04-06 NOTE — ED Notes (Signed)
 Pt is in the dayroom watching TV with peers. Pt denies SI/HI/AVH. Pt has no further complain.No acute distress noted. Will continue to monitor for safety and provide support.

## 2023-04-06 NOTE — Group Note (Signed)
 Group Topic: Relapse and Recovery  Group Date: 04/06/2023 Start Time: 2000 End Time: 2100 Facilitators: Darin Engels  Department: [facility]  Number of Participants: 5  Group Focus: abuse issues, acceptance, leisure skills, self-awareness, and substance abuse education Treatment Modality:  Behavior Modification Therapy and Leisure Development Interventions utilized were leisure development, story telling, and support Purpose: enhance coping skills, increase insight, relapse prevention strategies, and trigger / craving management  Name: Phyllis Goodman Date of Birth: November 26, 1995  MR: [id number]    Level of Participation: active Quality of Participation: attentive and cooperative Interactions with others: gave feedback Mood/Affect: appropriate and positive Triggers (if applicable): n/a Cognition: coherent/clear Progress: Gaining insight Response: pt encourages other group members by offering support and provides relatable experiences.  Plan: patient will be encouraged to continue attending group   Patients Problems:  Patient Active Problem List   Diagnosis Date Noted   Methamphetamine use disorder, moderate (HCC) 04/04/2023   Methamphetamine use disorder, moderate, dependence (HCC) 04/03/2023   History of endocarditis 03/20/2022   VBAC, delivered 09/20/2019   PPH (postpartum hemorrhage) 09/20/2019   Post-dates pregnancy 09/19/2019   Group beta Strep positive 08/22/2019   History of cesarean section 06/26/2019   Prior pregnancy with congenital cardiac defect, antepartum 06/26/2019   Rh negative state in antepartum period 06/17/2019   Supervision of other normal pregnancy, antepartum 05/14/2019   History of drug abuse (HCC) 07/28/2015   ADHD (attention deficit hyperactivity disorder), combined type 06/28/2015   Dysgraphia 06/28/2015

## 2023-04-06 NOTE — ED Notes (Addendum)
 Patient is room resting. Respirations equal and unlabored, skin warm and dry. No change in assessment or acuity. Routine safety checks conducted according to facility protocol. Will continue to monitor for safety.

## 2023-04-06 NOTE — Discharge Planning (Signed)
 LCSW was informed by MD that patient may likely discharge over the weekend and needs an outpatient follow up with her current provider as she has be restarted on Suboxone. LCSW followed up with [facility] [phone]. Patient already has a follow up appointment scheduled for 04/11/2023 at 12:30pm virtually with MD Tonna Corner. Patient can request for therapy services during next visit. Patient to be made aware. No other concerns to report at this time. Plan is for patient to return back home with family once she is stable for discharge.   Fernande Boyden, LCSW Clinical Social Worker [facility] Ph: [phone]

## 2023-04-06 NOTE — Group Note (Signed)
 Group Topic: Change and Accountability  Group Date: 04/06/2023 Start Time: 1000 End Time: 1100 Facilitators: Elenor Quinones, NT  Department: [facility]  Number of Participants: 5  Group Focus: daily focus Treatment Modality:  Psychoeducation Interventions utilized were problem solving Purpose: enhance coping skills  Name: Phyllis Goodman Date of Birth: [birth year]  MR: [id number]    Level of Participation: N/A Quality of Participation: N/A Interactions with others: N/A Mood/Affect: N/a Triggers (if applicable): N/A Cognition: N/A Progress: Other Response: N/A Plan: patient will be encouraged to attend groups  Patients Problems:  Patient Active Problem List   Diagnosis Date Noted   Methamphetamine use disorder, moderate (HCC) 04/04/2023   Methamphetamine use disorder, moderate, dependence (HCC) 04/03/2023   History of endocarditis 03/20/2022   VBAC, delivered 09/20/2019   PPH (postpartum hemorrhage) 09/20/2019   Post-dates pregnancy 09/19/2019   Group beta Strep positive 08/22/2019   History of cesarean section 06/26/2019   Prior pregnancy with congenital cardiac defect, antepartum 06/26/2019   Rh negative state in antepartum period 06/17/2019   Supervision of other normal pregnancy, antepartum 05/14/2019   History of drug abuse (HCC) 07/28/2015   ADHD (attention deficit hyperactivity disorder), combined type 06/28/2015   Dysgraphia 06/28/2015

## 2023-04-07 LAB — URINALYSIS, ROUTINE W REFLEX MICROSCOPIC
Bacteria, UA: NONE SEEN
Bilirubin Urine: NEGATIVE
Glucose, UA: NEGATIVE mg/dL
Ketones, ur: NEGATIVE mg/dL
Leukocytes,Ua: NEGATIVE
Nitrite: NEGATIVE
Protein, ur: NEGATIVE mg/dL
Specific Gravity, Urine: 1.029 (ref 1.005–1.030)
pH: 6 (ref 5.0–8.0)

## 2023-04-07 NOTE — Group Note (Addendum)
 Group Topic: Wellness  Group Date: 04/07/2023 Start Time: 1730 End Time: 1753 Facilitators: Dickie La, RN  Department: [facility]  Number of Participants: 6  Group Focus: coping skills, relapse prevention, and self-awareness Treatment Modality:  Psychoeducation Interventions utilized were patient education and support Purpose: enhance coping skills and increase insight  Name: Phyllis Goodman Date of Birth: Apr 22, 1995  MR: [id number]    Level of Participation: minimal Quality of Participation: attentive and cooperative Interactions with others: gave feedback Mood/Affect: appropriate Triggers (if applicable): none Cognition: coherent/clear, goal directed, and insightful Progress: Gaining insight Response: Pt participated in group and gave positive feedback. Plan: follow-up needed  Patients Problems:  Patient Active Problem List   Diagnosis Date Noted   Methamphetamine use disorder, moderate (HCC) 04/04/2023   Methamphetamine use disorder, moderate, dependence (HCC) 04/03/2023   History of endocarditis 03/20/2022   VBAC, delivered 09/20/2019   PPH (postpartum hemorrhage) 09/20/2019   Post-dates pregnancy 09/19/2019   Group beta Strep positive 08/22/2019   History of cesarean section 06/26/2019   Prior pregnancy with congenital cardiac defect, antepartum 06/26/2019   Rh negative state in antepartum period 06/17/2019   Supervision of other normal pregnancy, antepartum 05/14/2019   History of drug abuse (HCC) 07/28/2015   ADHD (attention deficit hyperactivity disorder), combined type 06/28/2015   Dysgraphia 06/28/2015

## 2023-04-07 NOTE — ED Provider Notes (Signed)
 Behavioral Health Progress Note  Date and Time: 04/07/2023 8:09 AM Name: Phyllis Goodman MRN:  [id number]  Subjective:   Patient evaluated at bedside.  Reports both sleep and appetite are adequate states mood is "good" today. She reports no specific goals for today. Denies any cravings today. On interview, suicidal ideations are not present. Thoughts of self harm are not present. Homicidal ideations are not present.   There are no auditory hallucinations, visual hallucinations, paranoid ideations, or delusional thought processes.   Side effects to currently prescribed medications are none. There are no somatic complaints. Reports regular bowel movements.. There are no withdrawals reported today.   She does inquire about discharge, and her plans moving forward.  We discussed CDIOP, and primary team's desire to get her directly into an intake assessment from admission.  She states that she would like discharge on Monday, as her mom would be able to provide a ride for her.  This Clinical research associate is in agreement with that plan.   Diagnosis:  Final diagnoses:  Stimulant use disorder  Methamphetamine abuse (HCC)  Tobacco use disorder  Vapes nicotine containing substance    Total Time spent with patient: 30 minutes  Contacted [facility] at [phone] on 04/05/23 Confirmed patient was last prescribed Zubsolv (buprenorphine 5.7 mg 3 times daily) in January 2025, with 30-day supply.  They confirm the patient has not had any other follow-up appointments since then, has canceled appointments multiple times.  They also confirm the patient has active prescription for Remeron 60 mg nightly and trazodone 150 mg nightly.  The patient is welcome to arrange appointment and reestablish care at any time.   Interim Note Following the phone call with [facility], I spoke with the patient.  She does admit that she has been noncompliant with her prescription of Zubsolv, and was attempting to stretch out her  30 day prescription from January to last her multiple months. She admits to skipping 2-3 days at a time. She continued to deny any withdrawal symptoms from opioids. She is amenable to restarting a low dose of suboxone while here and agrees to have follow up appointment with [facility] at discharge.    Substance Use Hx: Alcohol: denies Tobacco: vapes, 1 cartidge last 1 month. Has been using for the past 3 years.  Cannabis:denies Cocaine: denies Methamphetamines: started using around age 30.  Patient is unable to specify quantity, denies daily use, but reports she will spend $20 on methamphetamines weekly Psilocybin (mushrooms): denies Ecstasy (MDMA / molly): denies LSD (acid): never tried: denies Opiates (fentanyl / heroin): denies, chart review shows patient has previously been prescribed Suboxone and UDS is + for opioids. Patient denies any illicit use of opiods including heroine, oxycodone, or fentanyl.  Benzos (Xanax, Klonopin): denies IVDU: Reports prior history of IV drug use, denies any recent use   Past Psychiatric Hx: Patient reports she follows up with [facility] for medication management.  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 Reports she never used arundher children Legal:no upcoming court dates Military:denies Access to firearms: denies   Current Medications:  Current Facility-Administered Medications  Medication Dose Route Frequency Provider Last Rate Last Admin   acetaminophen (TYLENOL) tablet 650 mg  650 mg Oral Q6H PRN Weber, Kyra A, NP   650 mg at 04/04/23 1858   alum & mag hydroxide-simeth (MAALOX/MYLANTA) 200-200-20 MG/5ML suspension 30 mL  30 mL Oral Q4H PRN Weber, Kyra A, NP       buprenorphine-naloxone (SUBOXONE) 2-0.5 mg per SL tablet 1 tablet  1 tablet Sublingual Q12H Carrion-Carrero, Margely, MD   1 tablet at 04/06/23 2006   dicyclomine (BENTYL) tablet 20 mg  20 mg Oral Q6H PRN Sindy Guadeloupe, NP       haloperidol (HALDOL) tablet 5 mg  5 mg Oral TID PRN Weber, Bella Kennedy A, NP       And   diphenhydrAMINE (BENADRYL) capsule 50 mg  50 mg Oral TID PRN Weber, Kyra A, NP       haloperidol lactate (HALDOL) injection 5 mg  5 mg Intramuscular TID PRN Weber, Kyra A, NP       And   diphenhydrAMINE (BENADRYL) injection 50 mg  50 mg Intramuscular TID PRN Weber, Bella Kennedy A, NP       And   LORazepam (ATIVAN) injection 2 mg  2 mg Intramuscular TID PRN Weber, Bella Kennedy A, NP       haloperidol lactate (HALDOL) injection 10 mg  10 mg Intramuscular TID PRN Weber, Kyra A, NP       And   diphenhydrAMINE (BENADRYL) injection 50 mg  50 mg Intramuscular TID PRN Weber, Bella Kennedy A, NP       And   LORazepam (ATIVAN) injection 2 mg  2 mg Intramuscular TID PRN Weber, Bella Kennedy A, NP       hydrOXYzine (ATARAX) tablet 25 mg  25 mg Oral Q6H PRN Sindy Guadeloupe, NP   25 mg at 04/05/23 0950   loperamide (IMODIUM) capsule 2-4 mg  2-4 mg Oral PRN Sindy Guadeloupe, NP       LORazepam (ATIVAN) tablet 1 mg  1 mg Oral Q6H PRN  Carrion-Carrero, Margely, MD       magnesium hydroxide (MILK OF MAGNESIA) suspension 30 mL  30 mL Oral Daily PRN Weber, Kyra A, NP       methocarbamol (ROBAXIN) tablet 500 mg  500 mg Oral Q8H PRN Sindy Guadeloupe, NP   500 mg at 04/05/23 2117   mirtazapine (REMERON) tablet 45 mg  45 mg Oral QHS Carrion-Carrero, Margely, MD   45 mg at 04/06/23 2123   multivitamin with minerals tablet 1 tablet  1 tablet Oral Daily Carrion-Carrero, Margely, MD   1 tablet at 04/06/23 0944   naproxen (NAPROSYN) tablet 500 mg  500 mg Oral BID PRN Sindy Guadeloupe, NP   500 mg at 04/05/23 2118   nicotine (NICODERM CQ - dosed in mg/24 hours) patch 14 mg  14 mg Transdermal Daily Carrion-Carrero, Margely, MD       ondansetron (ZOFRAN-ODT) disintegrating tablet 4 mg  4 mg Oral Q6H PRN Sindy Guadeloupe, NP       traZODone (DESYREL) tablet 100 mg  100  mg Oral QHS PRN Lorri Frederick, MD   100 mg at 04/06/23 2124   Current Outpatient Medications  Medication Sig Dispense Refill   hydrOXYzine (VISTARIL) 25 MG capsule Take 25-50 mg by mouth daily as needed. (Patient taking differently: Take 50 mg by mouth daily as needed.)     mirtazapine (REMERON) 45 MG tablet Take 45 mg by mouth at bedtime.     traZODone (DESYREL) 100 MG tablet Take 100 mg by mouth at bedtime as needed.     ZUBSOLV 5.7-1.4 MG SUBL Take 1 tablet by mouth 3 (three) times daily.     SUBOXONE 2-0.5 MG FILM Place under the tongue. (Patient not taking: Reported on 04/05/2023)      Labs  Lab Results:  Admission on 04/04/2023  Component Date Value Ref Range Status   Total CK 04/06/2023 87  38 - 234 U/L Final   Performed at [facility], [address], [place], [place] [address]   Sodium 04/06/2023 142  135 - 145 mmol/L Final   Potassium 04/06/2023 3.7  3.5 - 5.1 mmol/L Final   Chloride 04/06/2023 114 (H)  98 - 111 mmol/L Final   CO2 04/06/2023 25  22 - 32 mmol/L Final   Glucose, Bld 04/06/2023 82  70 - 99 mg/dL Final   Glucose reference range applies  only to samples taken after fasting for at least 8 hours.   BUN 04/06/2023 10  6 - 20 mg/dL Final   Creatinine, Ser 04/06/2023 0.81  0.44 - 1.00 mg/dL Final   Calcium 56/38/7564 8.4 (L)  8.9 - 10.3 mg/dL Final   Total Protein 33/29/5188 5.6 (L)  6.5 - 8.1 g/dL Final   Albumin 41/66/0630 2.7 (L)  3.5 - 5.0 g/dL Final   AST 16/01/930 15  15 - 41 U/L Final   ALT 04/06/2023 14  0 - 44 U/L Final   Alkaline Phosphatase 04/06/2023 66  38 - 126 U/L Final   Total Bilirubin 04/06/2023 0.5  0.0 - 1.2 mg/dL Final   GFR, Estimated 04/06/2023 >60  >60 mL/min Final   Comment: (NOTE) Calculated using the CKD-EPI Creatinine Equation (2021)    Anion gap 04/06/2023 3 (L)  5 - 15 Final   Performed at [facility], [address] [place], [place] [address]   Color, Urine 04/06/2023 YELLOW  YELLOW Final   APPearance 04/06/2023 HAZY (A)  CLEAR Final   Specific Gravity, Urine 04/06/2023 1.029  1.005 - 1.030 Final   pH 04/06/2023 6.0  5.0 - 8.0 Final   Glucose, UA 04/06/2023 NEGATIVE  NEGATIVE mg/dL Final   Hgb urine dipstick 04/06/2023 LARGE (A)  NEGATIVE Final   Bilirubin Urine 04/06/2023 NEGATIVE  NEGATIVE Final   Ketones, ur 04/06/2023 NEGATIVE  NEGATIVE mg/dL Final   Protein, ur 22/02/5425 NEGATIVE  NEGATIVE mg/dL Final   Nitrite 07/16/7626 NEGATIVE  NEGATIVE Final   Leukocytes,Ua 04/06/2023 NEGATIVE  NEGATIVE Final   RBC / HPF 04/06/2023 11-20  0 - 5 RBC/hpf Final   WBC, UA 04/06/2023 6-10  0 - 5 WBC/hpf Final   Bacteria, UA 04/06/2023 NONE SEEN  NONE SEEN Final   Squamous Epithelial / HPF 04/06/2023 0-5  0 - 5 /HPF Final   Mucus 04/06/2023 PRESENT   Final   Ca Oxalate Crys, UA 04/06/2023 PRESENT   Final   Performed at [facility], [address], [place], [place] [address]  Admission on 04/03/2023, Discharged on 04/04/2023  Component Date Value Ref Range Status  Glucose-Capillary 04/03/2023 173 (H)  70 - 99 mg/dL Final   Glucose reference range applies only to samples taken after  fasting for at least 8 hours.   Sodium 04/03/2023 137  135 - 145 mmol/L Final   Potassium 04/03/2023 3.6  3.5 - 5.1 mmol/L Final   Chloride 04/03/2023 106  98 - 111 mmol/L Final   CO2 04/03/2023 24  22 - 32 mmol/L Final   Glucose, Bld 04/03/2023 159 (H)  70 - 99 mg/dL Final   Glucose reference range applies only to samples taken after fasting for at least 8 hours.   BUN 04/03/2023 17  6 - 20 mg/dL Final   Creatinine, Ser 04/03/2023 1.01 (H)  0.44 - 1.00 mg/dL Final   Calcium 44/01/270 8.3 (L)  8.9 - 10.3 mg/dL Final   Total Protein 53/66/4403 6.3 (L)  6.5 - 8.1 g/dL Final   Albumin 47/42/5956 3.2 (L)  3.5 - 5.0 g/dL Final   AST 38/75/6433 28  15 - 41 U/L Final   ALT 04/03/2023 15  0 - 44 U/L Final   Alkaline Phosphatase 04/03/2023 79  38 - 126 U/L Final   Total Bilirubin 04/03/2023 0.5  0.0 - 1.2 mg/dL Final   GFR, Estimated 04/03/2023 >60  >60 mL/min Final   Comment: (NOTE) Calculated using the CKD-EPI Creatinine Equation (2021)    Anion gap 04/03/2023 7  5 - 15 Final   Performed at [facility], [address], [place], [place] [address]   Lipase 04/03/2023 25  11 - 51 U/L Final   Performed at [facility], [address] [place], [place] [address]   WBC 04/03/2023 23.3 (H)  4.0 - 10.5 K/uL Final   RBC 04/03/2023 3.71 (L)  3.87 - 5.11 MIL/uL Final   Hemoglobin 04/03/2023 11.5 (L)  12.0 - 15.0 g/dL Final   HCT 07/23/1599 33.8 (L)  36.0 - 46.0 % Final   MCV 04/03/2023 91.1  80.0 - 100.0 fL Final   MCH 04/03/2023 31.0  26.0 - 34.0 pg Final   MCHC 04/03/2023 34.0  30.0 - 36.0 g/dL Final   RDW 09/32/3557 12.3  11.5 - 15.5 % Final   Platelets 04/03/2023 387  150 - 400 K/uL Final   nRBC 04/03/2023 0.0  0.0 - 0.2 % Final   Neutrophils Relative % 04/03/2023 82  % Final   Neutro Abs 04/03/2023 19.3 (H)  1.7 - 7.7 K/uL Final   Lymphocytes Relative 04/03/2023 10  % Final   Lymphs Abs 04/03/2023 2.2  0.7 - 4.0 K/uL Final   Monocytes Relative  04/03/2023 5  % Final   Monocytes Absolute 04/03/2023 1.1 (H)  0.1 - 1.0 K/uL Final   Eosinophils Relative 04/03/2023 2  % Final   Eosinophils Absolute 04/03/2023 0.5  0.0 - 0.5 K/uL Final   Basophils Relative 04/03/2023 0  % Final   Basophils Absolute 04/03/2023 0.1  0.0 - 0.1 K/uL Final   Immature Granulocytes 04/03/2023 1  % Final   Abs Immature Granulocytes 04/03/2023 0.13 (H)  0.00 - 0.07 K/uL Final   Performed at [facility], [address], [place], [place] [address]   Opiates 04/03/2023 POSITIVE (A)  NONE DETECTED Final   Cocaine 04/03/2023 NONE DETECTED  NONE DETECTED Final   Benzodiazepines 04/03/2023 POSITIVE (A)  NONE DETECTED Final   Amphetamines 04/03/2023 POSITIVE (A)  NONE DETECTED Final   Comment: (NOTE) Trazodone is metabolized in vivo to several metabolites, including pharmacologically active m-CPP,  which is excreted in the urine. Immunoassay screens for amphetamines and MDMA have potential cross-reactivity with these compounds and may provide false positive  results.     Tetrahydrocannabinol 04/03/2023 NONE DETECTED  NONE DETECTED Final   Barbiturates 04/03/2023 NONE DETECTED  NONE DETECTED Final   Comment: (NOTE) DRUG SCREEN FOR MEDICAL PURPOSES ONLY.  IF CONFIRMATION IS NEEDED FOR ANY PURPOSE, NOTIFY LAB WITHIN 5 DAYS.  LOWEST DETECTABLE LIMITS FOR URINE DRUG SCREEN Drug Class                     Cutoff (ng/mL) Amphetamine and metabolites    1000 Barbiturate and metabolites    200 Benzodiazepine                 200 Opiates and metabolites        300 Cocaine and metabolites        300 THC                            50 Performed at [facility], [address], [place], [place] [address]    Alcohol, Ethyl (B) 04/03/2023 <10  <10 mg/dL Final   Comment: (NOTE) Lowest detectable limit for serum alcohol is 10 mg/dL.  For medical purposes only. Performed at [facility], [address], [place], [place] [address]    Preg, Serum 04/03/2023 NEGATIVE  NEGATIVE Final   Comment:        THE SENSITIVITY OF THIS METHODOLOGY IS >10 mIU/mL. Performed at [facility], [address], [place], [place] [address]    Glucose-Capillary 04/03/2023 67 (L)  70 - 99 mg/dL Final   Glucose reference range applies only to samples taken after fasting for at least 8 hours.   Total CK 04/03/2023 654 (H)  38 - 234 U/L Final   Performed at [facility], [address], [place], [place] [address]   Glucose-Capillary 04/03/2023 157 (H)  70 - 99 mg/dL Final   Glucose reference range applies only to samples taken after fasting for at least 8 hours.   Glucose-Capillary 04/03/2023 74  70 - 99 mg/dL Final   Glucose reference range applies only to samples taken after fasting for at least 8 hours.   Total CK 04/03/2023 713 (H)  38 - 234 U/L Final   Performed at [facility], [address] [place], [place] [address]   Glucose-Capillary 04/03/2023 81  70 - 99 mg/dL Final   Glucose reference range applies only to samples taken after fasting for at least 8 hours.   Glucose-Capillary 04/03/2023 109 (H)  70 - 99 mg/dL Final   Glucose reference range applies only to samples taken after fasting for at least 8 hours.    Blood Alcohol level:  Lab Results  Component Value Date   ETH <10 04/03/2023    Metabolic Disorder Labs: Lab Results  Component Value Date   HGBA1C 5.2 05/22/2022   No results found for: "PROLACTIN" Lab Results  Component Value Date   CHOL 110 05/22/2022   TRIG 50.0 05/22/2022   HDL 38.70 (L) 05/22/2022   CHOLHDL 3 05/22/2022   VLDL 10.0 05/22/2022   LDLCALC 62 05/22/2022    Therapeutic Lab Levels: No results found for: "LITHIUM" No results found for: "VALPROATE" No results found for: "CBMZ"  Physical Findings   GAD-7    Flowsheet Row Office Visit from 11/21/2022 in [facility] [employer] from 05[facility]22/2022 in [facility] [employer] from 03[facility]20/2022 in [facility] [employer] from 04/17/2019 in [facility]  Total GAD-7 Score 4 7 7  0      PHQ2-9    Flowsheet Row ED from 04/04/2023 in [facility] Office Visit from 11/21/2022 in [facility] [place] Office Visit from 05/22/2022 in [facility] [employer] from 03/20/2022 in [facility] [employer] from 04/17/2019 in [facility]  PHQ-2 Total Score 0 4 4 1  0  PHQ-9 Total Score -- 8 10 9  0      Flowsheet Row ED from 04/04/2023 in [facility] ED from 04/03/2023 in [facility] at [facility] ED [facility]  C-SSRS RISK CATEGORY No Risk No Risk No Risk        Musculoskeletal  Strength & Muscle Tone: within normal limits Gait & Station: normal Patient leans: N/A  Psychiatric Specialty Exam  Presentation  General Appearance:  Appropriate for Environment  Eye Contact: Good  Speech: Clear and Coherent; Normal Rate  Speech Volume: Normal  Handedness: -- (not assessed)   Mood and Affect  Mood: -- ("Good")  Affect: Congruent; Full Range   Thought Process  Thought Processes: Linear  Descriptions of Associations:Intact  Orientation:None  Thought Content:Logical     Hallucinations:Hallucinations: None  Ideas of Reference:None  Suicidal Thoughts:Suicidal Thoughts: No  Homicidal Thoughts:Homicidal Thoughts: No   Sensorium  Memory: Immediate Good; Recent Good; Remote Good  Judgment: Fair  Insight: Fair   Producer, television/film/video: Fair  Attention Span: Fair  Recall: Fair  Fund of Knowledge: Fair  Language: Fair   Psychomotor Activity  Psychomotor Activity: Psychomotor Activity: Normal   Assets  Assets: Desire for Improvement; Resilience; Communication Skills   Sleep  Sleep: Sleep: Fair   Nutritional Assessment (For OBS and FBC admissions only) Has the patient had a weight loss or gain of 10 pounds or more in the last 3 months?: No Has the patient had a decrease in food intake/or appetite?: No Does the patient have dental problems?: No Does the patient have eating habits or behaviors that may be indicators of an eating disorder including binging or inducing vomiting?: No Has the patient recently lost weight without trying?: 0 Has the patient been eating poorly because of a decreased appetite?: 0 Malnutrition Screening Tool Score: 0    Physical Exam  Physical Exam Vitals and nursing note reviewed.  Constitutional:      General: She is not in acute distress.    Appearance: Normal appearance. She is not ill-appearing.  HENT:     Head: Normocephalic and atraumatic.  Eyes:     Extraocular Movements: Extraocular movements intact.     Conjunctiva/sclera: Conjunctivae normal.  Pulmonary:     Effort: Pulmonary effort is normal. No respiratory distress.  Musculoskeletal:        General: No swelling. Normal range of motion.  Skin:    General: Skin is warm and dry.  Neurological:     General: No focal deficit present.     Mental Status: She is  alert.    Review of Systems  All other systems reviewed and are negative.  Blood pressure 90/66, pulse 69, temperature 97.7 F (36.5 C), temperature source Oral, resp. rate 19, SpO2 98%. There is no height or weight on file to calculate BMI.  Treatment Plan Summary: Daily contact with patient to assess and evaluate symptoms and progress in treatment  R/o Benzodiazepine use --CIWA (started as a precaution, can likely be  discontinued by tomorrow) --symptom triggered therapy   Stimulant use disorder, Methamphetamine --Counseling given, cessation encouraged --Supportive PRNs   Opioid use disorder Last COWS score was 1 at 1101 AM on 3/15.  As scores have remained less than 4 for greater than 24 hours, we will discontinue at this time. PDMP confirmed last prescription of zubsolv (buprenorphine 7.5 mg TID) on Jan 7 -Continue Suboxone (buprenorphine 2 mg) every 12 hours -Tylenol 650 mg every 6 hours as needed for mild pain -Naproxen 500 mg BID as needed for pain -Bentyl 20 mg every 6 hours as needed for spasms/abdominal cramping -Robaxin 500 mg every 8 hours as needed for muscle spasms -Zofran 4 mg every 6 hours as needed for nausea or vomiting -Imodium 2 to 4 mg as needed for diarrhea or loose stools -Maalox/Mylanta 30 mL every 4 hours as needed for indigestion -Milk of Mag 30 mL as needed for constipation     Diagnosis of depression by history Patient denying any symptoms of depression on interview, patient requests medications be restarted. --Continue Remeron at 45 mg daily --Continue trazodone at 100 mg nightly as needed for insomnia   Tobacco use disorder -- Smoking cessation encouraged -- Nicotine patch 40 mg daily   Downtrending CK CK: [phone] Repeat CMP showing downtrending albumin 3.2 > 2.7, total protein 6.3 > 5.6  Disposition: Discharge Monday with CDIOP.  Patient to follow up with [facility] on 3/19.  Signed: Lamar Sprinkles, MD 04/07/2023 8:09 AM

## 2023-04-07 NOTE — ED Notes (Signed)
 Patient observed/assessed at nursing station. Patient alert and oriented x 4. Affect is flat. Patient denies pain and anxiety. He denies A/V/H. He denies having any thoughts/plan of self harm and harm towards others. Fluid and snack offered. Patient states that appetite has been good throughout the day. Verbalizes no further complaints at this time. Will continue to monitor and support.

## 2023-04-07 NOTE — ED Notes (Signed)
 Pt is in the dayroom watching TV with peers. Pt denies SI/HI/AVH. Pt has no further complain.No acute distress noted. Will continue to monitor for safety and provide support.

## 2023-04-07 NOTE — Group Note (Signed)
 Group Topic: Relaxation  Group Date: 04/07/2023 Start Time: 1915 End Time: 1945 Facilitators: Debe Coder, NT  Department: [facility]  Number of Participants: 7  Group Focus: coping skills Treatment Modality:  Skills Training Interventions utilized were group exercise Purpose: enhance coping skills  Name: Phyllis Goodman Date of Birth: 1995/07/17  MR: [id number]    Level of Participation: active Quality of Participation: engaged Interactions with others: gave feedback Mood/Affect: appropriate Triggers (if applicable):  Cognition: coherent/clear Progress: Moderate Response:  Plan: follow-up needed  Patients Problems:  Patient Active Problem List   Diagnosis Date Noted   Methamphetamine use disorder, moderate (HCC) 04/04/2023   Methamphetamine use disorder, moderate, dependence (HCC) 04/03/2023   History of endocarditis 03/20/2022   VBAC, delivered 09/20/2019   PPH (postpartum hemorrhage) 09/20/2019   Post-dates pregnancy 09/19/2019   Group beta Strep positive 08/22/2019   History of cesarean section 06/26/2019   Prior pregnancy with congenital cardiac defect, antepartum 06/26/2019   Rh negative state in antepartum period 06/17/2019   Supervision of other normal pregnancy, antepartum 05/14/2019   History of drug abuse (HCC) 07/28/2015   ADHD (attention deficit hyperactivity disorder), combined type 06/28/2015   Dysgraphia 06/28/2015

## 2023-04-07 NOTE — ED Notes (Signed)
 Patient observed/assessed in room in bed appearing in no immediate distress resting peacefully. Q15 minute checks continued by MHT and nursing staff. Will continue to monitor and support.

## 2023-04-07 NOTE — Progress Notes (Signed)
 Pt is presently asleep. Respirations are even and unlabored. No signs of acute distress noted. Administered scheduled meds per order. Pt denies pain and current SI/HI/AVH, plan or intent. Staff will monitor for pt's safety.

## 2023-04-07 NOTE — Progress Notes (Signed)
 Pt stayed in room most of the shift but woke up for meals. No distress noted or concerns voiced. Staff will monitor for pt's safety.

## 2023-04-07 NOTE — ED Notes (Signed)
 Patient is sleeping. Respirations equal and unlabored, skin warm and dry. No change in assessment or acuity. Routine safety checks conducted according to facility protocol. Will continue to monitor for safety.

## 2023-04-07 NOTE — Group Note (Signed)
 Group Topic: Balance in Life  Group Date: 04/07/2023 Start Time: 0130 End Time: 0200 Facilitators: Concha Norway, NT  Department: [facility]  Number of Participants: 6  Group Focus: acceptance Treatment Modality:  Behavior Modification Therapy Interventions utilized were clarification Purpose: express feelings  Name: Phyllis Goodman Date of Birth: 07/20/1995  MR: [id number]    Level of Participation: minimal Quality of Participation: attentive Interactions with others: gave feedback Mood/Affect: appropriate Triggers (if applicable):  Cognition: confused Progress: Moderate Response:  Plan: follow-up needed  Patients Problems:  Patient Active Problem List   Diagnosis Date Noted   Methamphetamine use disorder, moderate (HCC) 04/04/2023   Methamphetamine use disorder, moderate, dependence (HCC) 04/03/2023   History of endocarditis 03/20/2022   VBAC, delivered 09/20/2019   PPH (postpartum hemorrhage) 09/20/2019   Post-dates pregnancy 09/19/2019   Group beta Strep positive 08/22/2019   History of cesarean section 06/26/2019   Prior pregnancy with congenital cardiac defect, antepartum 06/26/2019   Rh negative state in antepartum period 06/17/2019   Supervision of other normal pregnancy, antepartum 05/14/2019   History of drug abuse (HCC) 07/28/2015   ADHD (attention deficit hyperactivity disorder), combined type 06/28/2015   Dysgraphia 06/28/2015

## 2023-04-08 NOTE — ED Notes (Signed)
 Pt is in the dayroom watching TV with peers. Pt denies SI/HI/AVH. Pt has no further complain.No acute distress noted. Will continue to monitor for safety and provide support.

## 2023-04-08 NOTE — ED Provider Notes (Addendum)
 Behavioral Health Progress Note  Date and Time: 04/08/2023 9:26 AM Name: ARDYN FORGE MRN:  [id number]  Subjective:  Phyllis Goodman 28 year old Caucasian female presents with methamphetamine use disorder, stimulant use disorder and substance-induced mood disorder.  She was seen and evaluated face-to-face by this provider.  She presents with a bright and pleasant affect.  Denying suicidal or homicidal ideations.  Denies auditory visual hallucinations.  She reported she had a relapse where she had been sober for the past 2 years and relapsed 2 weeks prior.  She reports she currently resides with her parents and her 2 children.  76-year-old and a 41-year-old.  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  Reports employment is intermittent.  She reports plans to discharge and follow-up with intensive outpatient programming and/or chemical dependency outpatient program.  Jalan denied any cravings or withdrawal symptoms during this assessment.  Rating her depression and anxiety 3 out of 10 with 10 being the worst.   Documented COWS 0. Reports good appetite.  States she is resting well throughout the night. Patient has anticipated discharge 04/09/2023.  Support, encouragement  and reassurance was provided.  Phyllis Goodman is resting in bed; she is alert/oriented x 3; calm/cooperative; and mood congruent with affect.  Patient is speaking in a clear tone at moderate volume, and normal pace; with good eye contact.Her thought process is coherent and relevant;   There is no indication that she is currently responding to internal/external stimuli or experiencing delusional thought content.  Patient denies suicidal/self-harm/homicidal ideation, psychosis, and paranoia.  Patient has remained calm throughout assessment and has answered questions appropriately.   Diagnosis:  Final diagnoses:  Stimulant use disorder  Methamphetamine abuse (HCC)  Tobacco use disorder  Vapes nicotine  containing substance    Total Time spent with patient: 15 minutes  Past Psychiatric History: see char  Past Medical History: See chart Family History: see chart Family Psychiatric  History: see chart Social History: see chart  Additional Social History:                         Sleep: Good  Appetite:  Good  Current Medications:  Current Facility-Administered Medications  Medication Dose Route Frequency Provider Last Rate Last Admin   acetaminophen (TYLENOL) tablet 650 mg  650 mg Oral Q6H PRN Weber, Kyra A, NP   650 mg at 04/04/23 1858   alum & mag hydroxide-simeth (MAALOX/MYLANTA) 200-200-20 MG/5ML suspension 30 mL  30 mL Oral Q4H PRN Weber, Kyra A, NP       buprenorphine-naloxone (SUBOXONE) 2-0.5 mg per SL tablet 1 tablet  1 tablet Sublingual Q12H Carrion-Carrero, Margely, MD   1 tablet at 04/08/23 0848   dicyclomine (BENTYL) tablet 20 mg  20 mg Oral Q6H PRN Sindy Guadeloupe, NP       haloperidol (HALDOL) tablet 5 mg  5 mg Oral TID PRN Weber, Kyra A, NP       And   diphenhydrAMINE (BENADRYL) capsule 50 mg  50 mg Oral TID PRN Weber, Kyra A, NP       haloperidol lactate (HALDOL) injection 5 mg  5 mg Intramuscular TID PRN Weber, Kyra A, NP       And   diphenhydrAMINE (BENADRYL) injection 50 mg  50 mg Intramuscular TID PRN Weber, Kyra A, NP       And   LORazepam (ATIVAN) injection 2 mg  2 mg Intramuscular TID PRN Weber, Leavy Cella,  NP       haloperidol lactate (HALDOL) injection 10 mg  10 mg Intramuscular TID PRN Weber, Bella Kennedy A, NP       And   diphenhydrAMINE (BENADRYL) injection 50 mg  50 mg Intramuscular TID PRN Weber, Bella Kennedy A, NP       And   LORazepam (ATIVAN) injection 2 mg  2 mg Intramuscular TID PRN Weber, Bella Kennedy A, NP       hydrOXYzine (ATARAX) tablet 25 mg  25 mg Oral Q6H PRN Sindy Guadeloupe, NP   25 mg at 04/05/23 0950   loperamide (IMODIUM) capsule 2-4 mg  2-4 mg Oral PRN Sindy Guadeloupe, NP       LORazepam (ATIVAN) tablet 1 mg  1 mg Oral Q6H PRN Carrion-Carrero, Margely, MD        magnesium hydroxide (MILK OF MAGNESIA) suspension 30 mL  30 mL Oral Daily PRN Weber, Kyra A, NP       methocarbamol (ROBAXIN) tablet 500 mg  500 mg Oral Q8H PRN Sindy Guadeloupe, NP   500 mg at 04/05/23 2117   mirtazapine (REMERON) tablet 45 mg  45 mg Oral QHS Carrion-Carrero, Margely, MD   45 mg at 04/07/23 2120   multivitamin with minerals tablet 1 tablet  1 tablet Oral Daily Carrion-Carrero, Margely, MD   1 tablet at 04/08/23 0848   naproxen (NAPROSYN) tablet 500 mg  500 mg Oral BID PRN Sindy Guadeloupe, NP   500 mg at 04/05/23 2118   nicotine (NICODERM CQ - dosed in mg/24 hours) patch 14 mg  14 mg Transdermal Daily Carrion-Carrero, Margely, MD       ondansetron (ZOFRAN-ODT) disintegrating tablet 4 mg  4 mg Oral Q6H PRN Sindy Guadeloupe, NP       traZODone (DESYREL) tablet 100 mg  100 mg Oral QHS PRN Carrion-Carrero, Karle Starch, MD   100 mg at 04/07/23 2120   Current Outpatient Medications  Medication Sig Dispense Refill   hydrOXYzine (VISTARIL) 25 MG capsule Take 25-50 mg by mouth daily as needed. (Patient taking differently: Take 50 mg by mouth daily as needed.)     mirtazapine (REMERON) 45 MG tablet Take 45 mg by mouth at bedtime.     traZODone (DESYREL) 100 MG tablet Take 100 mg by mouth at bedtime as needed.     ZUBSOLV 5.7-1.4 MG SUBL Take 1 tablet by mouth 3 (three) times daily.     SUBOXONE 2-0.5 MG FILM Place under the tongue. (Patient not taking: Reported on 04/05/2023)      Labs  Lab Results:  Admission on 04/04/2023  Component Date Value Ref Range Status   Total CK 04/06/2023 87  38 - 234 U/L Final   Performed at [facility], [address] [place], [place] [address]   Sodium 04/06/2023 142  135 - 145 mmol/L Final   Potassium 04/06/2023 3.7  3.5 - 5.1 mmol/L Final   Chloride 04/06/2023 114 (H)  98 - 111 mmol/L Final   CO2 04/06/2023 25  22 - 32 mmol/L Final   Glucose, Bld 04/06/2023 82  70 - 99 mg/dL Final   Glucose reference range applies only to samples taken after  fasting for at least 8 hours.   BUN 04/06/2023 10  6 - 20 mg/dL Final   Creatinine, Ser 04/06/2023 0.81  0.44 - 1.00 mg/dL Final   Calcium 60/45/4098 8.4 (L)  8.9 - 10.3 mg/dL Final   Total Protein 11/91/4782 5.6 (L)  6.5 - 8.1 g/dL Final   Albumin  04/06/2023 2.7 (L)  3.5 - 5.0 g/dL Final   AST 16/10/9602 15  15 - 41 U/L Final   ALT 04/06/2023 14  0 - 44 U/L Final   Alkaline Phosphatase 04/06/2023 66  38 - 126 U/L Final   Total Bilirubin 04/06/2023 0.5  0.0 - 1.2 mg/dL Final   GFR, Estimated 04/06/2023 >60  >60 mL/min Final   Comment: (NOTE) Calculated using the CKD-EPI Creatinine Equation (2021)    Anion gap 04/06/2023 3 (L)  5 - 15 Final   Performed at [facility], [address], [place], [place] [address]   Color, Urine 04/06/2023 YELLOW  YELLOW Final   APPearance 04/06/2023 HAZY (A)  CLEAR Final   Specific Gravity, Urine 04/06/2023 1.029  1.005 - 1.030 Final   pH 04/06/2023 6.0  5.0 - 8.0 Final   Glucose, UA 04/06/2023 NEGATIVE  NEGATIVE mg/dL Final   Hgb urine dipstick 04/06/2023 LARGE (A)  NEGATIVE Final   Bilirubin Urine 04/06/2023 NEGATIVE  NEGATIVE Final   Ketones, ur 04/06/2023 NEGATIVE  NEGATIVE mg/dL Final   Protein, ur 11/91/4782 NEGATIVE  NEGATIVE mg/dL Final   Nitrite 95/62/1308 NEGATIVE  NEGATIVE Final   Leukocytes,Ua 04/06/2023 NEGATIVE  NEGATIVE Final   RBC / HPF 04/06/2023 11-20  0 - 5 RBC/hpf Final   WBC, UA 04/06/2023 6-10  0 - 5 WBC/hpf Final   Bacteria, UA 04/06/2023 NONE SEEN  NONE SEEN Final   Squamous Epithelial / HPF 04/06/2023 0-5  0 - 5 /HPF Final   Mucus 04/06/2023 PRESENT   Final   Ca Oxalate Crys, UA 04/06/2023 PRESENT   Final   Performed at [facility], [address] [place], [place] [address]  Admission on 04/03/2023, Discharged on 04/04/2023  Component Date Value Ref Range Status   Glucose-Capillary 04/03/2023 173 (H)  70 - 99 mg/dL Final   Glucose reference range applies only to samples taken after fasting for at least 8 hours.    Sodium 04/03/2023 137  135 - 145 mmol/L Final   Potassium 04/03/2023 3.6  3.5 - 5.1 mmol/L Final   Chloride 04/03/2023 106  98 - 111 mmol/L Final   CO2 04/03/2023 24  22 - 32 mmol/L Final   Glucose, Bld 04/03/2023 159 (H)  70 - 99 mg/dL Final   Glucose reference range applies only to samples taken after fasting for at least 8 hours.   BUN 04/03/2023 17  6 - 20 mg/dL Final   Creatinine, Ser 04/03/2023 1.01 (H)  0.44 - 1.00 mg/dL Final   Calcium 69/62/9528 8.3 (L)  8.9 - 10.3 mg/dL Final   Total Protein 41/32/4401 6.3 (L)  6.5 - 8.1 g/dL Final   Albumin 02/72/5366 3.2 (L)  3.5 - 5.0 g/dL Final   AST 44/03/4740 28  15 - 41 U/L Final   ALT 04/03/2023 15  0 - 44 U/L Final   Alkaline Phosphatase 04/03/2023 79  38 - 126 U/L Final   Total Bilirubin 04/03/2023 0.5  0.0 - 1.2 mg/dL Final   GFR, Estimated 04/03/2023 >60  >60 mL/min Final   Comment: (NOTE) Calculated using the CKD-EPI Creatinine Equation (2021)    Anion gap 04/03/2023 7  5 - 15 Final   Performed at [facility], [address] [place], [place] [address]   Lipase 04/03/2023 25  11 - 51 U/L Final   Performed at [facility], [address], [place], [place] [address]   WBC 04/03/2023 23.3 (H)  4.0 - 10.5 K/uL Final   RBC 04/03/2023 3.71 (L)  3.87 - 5.11 MIL/uL Final   Hemoglobin 04/03/2023 11.5 (L)  12.0 - 15.0 g/dL Final   HCT 16/10/9602 33.8 (L)  36.0 - 46.0 % Final   MCV 04/03/2023 91.1  80.0 - 100.0 fL Final   MCH 04/03/2023 31.0  26.0 - 34.0 pg Final   MCHC 04/03/2023 34.0  30.0 - 36.0 g/dL Final   RDW 54/09/8117 12.3  11.5 - 15.5 % Final   Platelets 04/03/2023 387  150 - 400 K/uL Final   nRBC 04/03/2023 0.0  0.0 - 0.2 % Final   Neutrophils Relative % 04/03/2023 82  % Final   Neutro Abs 04/03/2023 19.3 (H)  1.7 - 7.7 K/uL Final   Lymphocytes Relative 04/03/2023 10  % Final   Lymphs Abs 04/03/2023 2.2  0.7 - 4.0 K/uL Final   Monocytes Relative 04/03/2023 5  % Final   Monocytes  Absolute 04/03/2023 1.1 (H)  0.1 - 1.0 K/uL Final   Eosinophils Relative 04/03/2023 2  % Final   Eosinophils Absolute 04/03/2023 0.5  0.0 - 0.5 K/uL Final   Basophils Relative 04/03/2023 0  % Final   Basophils Absolute 04/03/2023 0.1  0.0 - 0.1 K/uL Final   Immature Granulocytes 04/03/2023 1  % Final   Abs Immature Granulocytes 04/03/2023 0.13 (H)  0.00 - 0.07 K/uL Final   Performed at [facility], [address] [place], [place] [address]   Opiates 04/03/2023 POSITIVE (A)  NONE DETECTED Final   Cocaine 04/03/2023 NONE DETECTED  NONE DETECTED Final   Benzodiazepines 04/03/2023 POSITIVE (A)  NONE DETECTED Final   Amphetamines 04/03/2023 POSITIVE (A)  NONE DETECTED Final   Comment: (NOTE) Trazodone is metabolized in vivo to several metabolites, including pharmacologically active m-CPP, which is excreted in the urine. Immunoassay screens for amphetamines and MDMA have potential cross-reactivity with these compounds and may provide false positive  results.     Tetrahydrocannabinol 04/03/2023 NONE DETECTED  NONE DETECTED Final   Barbiturates 04/03/2023 NONE DETECTED  NONE DETECTED Final   Comment: (NOTE) DRUG SCREEN FOR MEDICAL PURPOSES ONLY.  IF CONFIRMATION IS NEEDED FOR ANY PURPOSE, NOTIFY LAB WITHIN 5 DAYS.  LOWEST DETECTABLE LIMITS FOR URINE DRUG SCREEN Drug Class                     Cutoff (ng/mL) Amphetamine and metabolites    1000 Barbiturate and metabolites    200 Benzodiazepine                 200 Opiates and metabolites        300 Cocaine and metabolites        300 THC                            50 Performed at [facility], [address], [place], [place] [address]    Alcohol, Ethyl (B) 04/03/2023 <10  <10 mg/dL Final   Comment: (NOTE) Lowest detectable limit for serum alcohol is 10 mg/dL.  For medical purposes only. Performed at [facility], [address] [place], [place] [address]    Preg, Serum  04/03/2023 NEGATIVE  NEGATIVE Final   Comment:        THE SENSITIVITY OF THIS METHODOLOGY IS >10 mIU/mL. Performed at [facility], [address], [place], [place] [address]    Glucose-Capillary 04/03/2023 67 (L)  70 - 99 mg/dL Final   Glucose reference range applies only to samples taken after fasting for at least 8 hours.   Total CK 04/03/2023 654 (H)  38 - 234 U/L Final   Performed at [facility], [address], [place], [place] [address]   Glucose-Capillary 04/03/2023 157 (H)  70 - 99 mg/dL Final   Glucose reference range applies only to samples taken after fasting for at least 8 hours.   Glucose-Capillary 04/03/2023 74  70 - 99 mg/dL Final   Glucose reference range applies only to samples taken after fasting for at least 8 hours.   Total CK 04/03/2023 713 (H)  38 - 234 U/L Final   Performed at [facility], [address], [place], [place] [address]   Glucose-Capillary 04/03/2023 81  70 - 99 mg/dL Final   Glucose reference range applies only to samples taken after fasting for at least 8 hours.   Glucose-Capillary 04/03/2023 109 (H)  70 - 99 mg/dL Final   Glucose reference range applies only to samples taken after fasting for at least 8 hours.    Blood Alcohol level:  Lab Results  Component Value Date   ETH <10 04/03/2023    Metabolic Disorder Labs: Lab Results  Component Value Date   HGBA1C 5.2 05/22/2022   No results found for: "PROLACTIN" Lab Results  Component Value Date   CHOL 110 05/22/2022   TRIG 50.0 05/22/2022   HDL 38.70 (L) 05/22/2022   CHOLHDL 3 05/22/2022   VLDL 10.0 05/22/2022   LDLCALC 62 05/22/2022    Therapeutic Lab Levels: No results found for: "LITHIUM" No results found for: "VALPROATE" No results found for: "CBMZ"  Physical Findings   GAD-7    Flowsheet Row Office Visit from 11/21/2022 in [facility] [employer] Visit from 05/22/2022 in [facility] [employer] Visit from 03/20/2022 in [facility] [employer] Visit from 04/17/2019 in [facility] [place]  Total GAD-7 Score 4 7 7  0      PHQ2-9    Flowsheet Row ED from 04/04/2023 in [facility] Office Visit from 11/21/2022 in [facility] [place] [facility] Visit from 05/22/2022 in [facility] [employer] from 03/20/2022 in [facility] [employer] [facility] [employer] from 04/17/2019 in [facility] [place]  PHQ-2 Total Score 0 4 4 1  0  PHQ-9 Total Score -- 8 10 9  0      Flowsheet Row ED from 04/04/2023 in [facility] ED from 04/03/2023 in [facility] Emergency Department at [facility] ED from 03/20/2023 in [facility]  C-SSRS RISK CATEGORY No Risk No Risk No Risk        Musculoskeletal  Strength & Muscle Tone: within normal limits Gait & Station: normal Patient leans: N/A  Psychiatric Specialty Exam  Presentation  General Appearance:  Appropriate for Environment  Eye Contact: Good  Speech: Clear and Coherent  Speech Volume: Normal  Handedness: Right   Mood and Affect  Mood: Euthymic  Affect: Congruent   Thought Process  Thought Processes: Coherent  Descriptions of Associations:Intact  Orientation:Full (Time, Place and Person)  Thought Content:Logical  Hallucinations:Hallucinations: None  Ideas of Reference:None  Suicidal Thoughts:Suicidal Thoughts: No  Homicidal Thoughts:Homicidal Thoughts: No   Sensorium  Memory: Immediate Good; Recent Good; Remote Good  Judgment: Good  Insight: Fair   Art therapist  Concentration: Fair  Attention Span: Good  Recall: Good  Fund of  Knowledge: Fair  Language: Good   Psychomotor Activity  Psychomotor Activity:Psychomotor Activity: Normal   Assets  Assets: Desire for Improvement   Sleep  Sleep:Sleep: Fair   Nutritional Assessment (For OBS and FBC admissions only) Has the patient had a weight loss or gain of 10 pounds or more in the last 3 months?: No Has the patient had a decrease in food intake/or appetite?: No Does the patient have dental problems?: No Does the patient have eating habits or behaviors that may be indicators of an eating disorder including binging or inducing vomiting?: No Has the patient recently lost weight without trying?: 0 Has the patient been eating poorly because of a decreased appetite?: 0 Malnutrition Screening Tool Score: 0    Physical Exam  Physical Exam Vitals and nursing note reviewed.  Constitutional:      Appearance: Normal appearance.  Neurological:     Mental Status: She is alert and oriented to person, place, and time.  Psychiatric:        Mood and Affect: Mood normal.        Behavior: Behavior normal.        Thought Content: Thought content normal.    Review of Systems  Psychiatric/Behavioral:  Positive for substance abuse. Negative for depression and suicidal ideas.   All other systems reviewed and are negative.  Blood pressure 102/71, pulse 70, temperature 98 F (36.7 C), temperature source Oral, resp. rate 18, SpO2 99%. There is no height or weight on file to calculate BMI.  Treatment Plan Summary: Daily contact with patient to assess and evaluate symptoms and progress in treatment and Medication management  Continue current treatment plan on the range 16 2025 as listed below except were noted  Substance-induced mood disorder: Methamphetamine use/abuse  Continue to monitor COWS Continue Remeron 45 mg nightly Continue trazodone 100 mg nightly as needed Continue Subutex 2 mg twice daily  CSW to continue working on discharge  disposition Anticipated discharge 04/09/2023 with follow-up with chemical dependency outpatient programming  Oneta Rack, NP 04/08/2023 9:26 AM

## 2023-04-08 NOTE — Group Note (Signed)
 Group Topic: Balance in Life  Group Date: 04/08/2023 Start Time: 1200 End Time: 1230 Facilitators: Concha Norway, NT  Department: [facility]  Number of Participants: 7  Group Focus: acceptance Treatment Modality:  Behavior Modification Therapy Interventions utilized were clarification Purpose: enhance coping skills  Name: CHARICE ZUNO Date of Birth: Nov 15, 1995  MR: [id number]    Level of Participation: minimal Quality of Participation: attentive Interactions with others: gave feedback Mood/Affect: appropriate Triggers (if applicable):   Cognition: concrete Progress: Moderate Response:   Plan: follow-up needed  Patients Problems:  Patient Active Problem List   Diagnosis Date Noted   Methamphetamine use disorder, moderate (HCC) 04/04/2023   Methamphetamine use disorder, moderate, dependence (HCC) 04/03/2023   History of endocarditis 03/20/2022   VBAC, delivered 09/20/2019   PPH (postpartum hemorrhage) 09/20/2019   Post-dates pregnancy 09/19/2019   Group beta Strep positive 08/22/2019   History of cesarean section 06/26/2019   Prior pregnancy with congenital cardiac defect, antepartum 06/26/2019   Rh negative state in antepartum period 06/17/2019   Supervision of other normal pregnancy, antepartum 05/14/2019   History of drug abuse (HCC) 07/28/2015   ADHD (attention deficit hyperactivity disorder), combined type 06/28/2015   Dysgraphia 06/28/2015

## 2023-04-08 NOTE — ED Notes (Signed)
 Patient is sleeping. Respirations equal and unlabored, skin warm and dry. No change in assessment or acuity. Routine safety checks conducted according to facility protocol. Will continue to monitor for safety.

## 2023-04-08 NOTE — ED Notes (Signed)
 Patient A&Ox4. Denies intent to harm self/others when asked. Denies A/VH. Patient denies any physical complaints. No acute distress observed. Routine safety checks conducted according to facility protocol. Patient agreed to notify staff if thoughts of harm toward self or others arise. Will continue to monitor for safety.

## 2023-04-08 NOTE — ED Notes (Signed)
Patient observed resting quietly, eyes closed. Respirations equal and unlabored. Will continue to monitor for safety.  

## 2023-04-09 MED ORDER — NICOTINE 14 MG/24HR TD PT24: 14.0000 mg | MEDICATED_PATCH | Freq: Every day | TRANSDERMAL | 0 refills | Status: DC

## 2023-04-09 MED ORDER — ADULT MULTIVITAMIN W/MINERALS CH: 1.0000 | ORAL_TABLET | Freq: Every day | ORAL | Status: AC

## 2023-04-09 MED ORDER — BUPRENORPHINE HCL-NALOXONE HCL 2-0.5 MG SL SUBL
1.0000 | SUBLINGUAL_TABLET | Freq: Two times a day (BID) | SUBLINGUAL | 0 refills | Status: DC
Start: 2023-04-09 — End: 2023-04-30

## 2023-04-09 MED ORDER — TRAZODONE HCL 100 MG PO TABS: 100.0000 mg | ORAL_TABLET | Freq: Every evening | ORAL | 0 refills | Status: DC | PRN

## 2023-04-09 MED ORDER — MIRTAZAPINE 45 MG PO TABS: 45.0000 mg | ORAL_TABLET | Freq: Every day | ORAL | 0 refills | Status: DC

## 2023-04-09 NOTE — ED Notes (Signed)
 Pt discharged with AVS by this nurse. AVS & suicide safety plan reviewed prior to discharge. Pt alert, oriented, and ambulatory. Safety maintained.

## 2023-04-09 NOTE — ED Provider Notes (Signed)
 FBC/OBS ASAP Discharge Summary  Date and Time: 04/09/2023 10:21 AM  Name: Phyllis Goodman  MRN:  [id number]   Discharge Diagnoses:  Final diagnoses:  Stimulant use disorder  Methamphetamine abuse (HCC)  Tobacco use disorder  Vapes nicotine containing substance    Subjective:  Patient evaluated on day of discharge.  She is in good spirits, denies any significant symptoms of depression or anxiety.  She reports adequate sleep and appetite.  She notes no significant symptoms of withdrawals are present on day of discharge.  She denies any cravings.  On interview the patient denies any suicidal ideations, homicidal ideations, or hallucinations.  She confirms she is discharging home with her mother and is motivated to follow-up with outpatient services that were arranged for her.  She also agrees to follow-up with CD IOP services.  Stay Summary:  Phyllis Goodman is a 28 yo female with a past history of polysubstance abuse, anxiety, ADHD, and depression.  Patient's past medical history is significant for endocarditis, with patient reporting prior IV drug use.  The patient was initially brought to the [facility] via EMS for AMS in the setting of methamphetamine use on 04/03/2023.  She has been admitted to the [facility] on 04/05/2023 for detox, with patient expressing interest in following up with CD IOP services at discharge.  Initial UDS positive for opiates, benzodiazepines, amphetamines.   During the patient's hospitalization at the [facility], patient had extensive initial psychiatric evaluation, with daily follow-up assessments focused on detoxification management.  Patient's medications adjusted during hospitalization:   Stimulant use disorder, Methamphetamine --Counseling given, cessation encouraged --Supportive PRNs   Opioid use disorder --PDMP was reviewed on admission which confirmed last prescription of zubsolv (buprenorphine 7.5 mg TID) on Jan 7  On admission the patient did admit to misuse of this  medication, reporting she had been going 2 to 3 days without taking her buprenorphine in order to make it last months at a time.  Patient had uptrending COWS prior to restarting Suboxone and we agreed to start the patient at a lower dose.  -Patient was started and continued on Suboxone (buprenorphine 2 mg) every 12 hours  On day of discharge the patient confirmed that she had an unspecified number of Zubsolv 5.7 mg tablets left in her home.  Patient was educated on importance of discarding this medication and continuing with Suboxone prescribed at discharge as the dose of buprenorphine was decreased.  She was provided with a prescription of Suboxone (4 tablets, 0 refills) to last her until her outpatient appointment at [facility] in 2 days.  Patient also provided verbal consent for me to speak with her mother regarding the changes to her medications.  This provider spoke with patient's mother Gavin Pound, in person, who confirmed she had the patient's medications at home and would ensure the Zubsolv was discarded.    Diagnosis of depression by history Patient denying any symptoms of depression on interview, patient requests medications be restarted. --Restart Remeron at 45 mg daily --Restart trazodone at 100 mg nightly as needed for insomnia   Tobacco use disorder -- Smoking cessation encouraged -- Nicotine patch 40 mg daily   Patient's care was discussed during the interdisciplinary team meeting every day during the hospitalization.  The patient denies having side effects to prescribed psychiatric medication.  Gradually, patient started adjusting to milieu. The patient was evaluated each day by a clinical provider to ascertain response to treatment. Improvement was noted by the patient's report of decreasing symptoms, improved sleep  and appetite, affect, medication tolerance, behavior, and participation in unit programming.  Patient was asked each day to complete a self inventory  noting mood, mental status, pain, new symptoms, anxiety and concerns.    Symptoms were reported as significantly decreased or resolved completely by discharge.   On day of discharge, the patient reports that their mood is stable. The patient denied having suicidal thoughts for more than 48 hours prior to discharge.  Patient denies having homicidal thoughts.  Patient denies having auditory hallucinations.  Patient denies any visual hallucinations or other symptoms of psychosis. The patient was motivated to continue taking medication with a goal of continued improvement in mental health.   The patient reported that their withdrawal symptoms and cravings responded well to the detox regimen, with overall benefit from the detox program. Supportive psychotherapy was provided, and the patient participated in regular group therapy sessions focused on managing cravings and withdrawal. Coping skills, problem-solving, and relaxation techniques were also part of the program's therapeutic interventions.  Labs were reviewed with the patient, and abnormal results were discussed with the patient.  The patient is able to verbalize their individual safety plan to this provider.  # It is recommended to the patient to continue psychiatric medications as prescribed, after discharge from the hospital.    # It is recommended to the patient to follow up with their outpatient psychiatric provider and PCP.  # It was discussed with the patient, the impact of alcohol, drugs, tobacco have been there overall psychiatric and medical wellbeing, and total abstinence from substance use was recommended.  # Prescriptions provided or sent directly to preferred pharmacy at discharge. Patient agreeable to plan. Given opportunity to ask questions. Appears to feel comfortable with discharge.    # In the event of worsening symptoms, the patient is instructed to call the crisis hotline, 911 and or go to the nearest ED for appropriate  evaluation and treatment of symptoms. To follow-up with primary care provider for other medical issues, concerns and or health care needs  # Patient was discharged Home with a plan to follow up as noted below.     Total Time spent with patient: 1.5 hours  Substance Use Hx: Alcohol: denies Tobacco: vapes, 1 cartidge last 1 month. Has been using for the past 3 years.  Cannabis:denies Cocaine: denies Methamphetamines: started using around age 66.  Patient is unable to specify quantity, denies daily use, but reports she will spend $20 on methamphetamines weekly Psilocybin (mushrooms): denies Ecstasy (MDMA / molly): denies LSD (acid): never tried: denies Opiates (fentanyl / heroin): denies, chart review shows patient has previously been prescribed Suboxone and UDS is + for opioids. Patient denies any illicit use of opiods including heroine, oxycodone, or fentanyl.  Benzos (Xanax, Klonopin): denies IVDU: Reports prior history of IV drug use, denies any recent use   Past Psychiatric Hx: Patient reports she follows up with [facility] for medication management.  Previous psychiatric diagnoses on chart review include anxiety, depression, insomnia Current medications as confirmed by [facility] on 04/05/2023 Trazodone 150 mg nightly Remeron 60 mg nightly Zubsolv (buprenorphine 5.7 mg 3 times daily), last prescribed January 7th Psychiatric Hospitalization AV:[id number] History of suicide (obtained from HPI):denies NSSIB Hx: denies History of homicide or aggression (obtained in HPI):denies   Past Medical History: Medical Dx: Documented history of endocarditis from IV drug use in 2023 Medications:denies Allergies: denies Hospitalizations:No Surgeries: denies Trauma:denies Seizures:denies   LMP:30 days ago Contraceptives:denies   Family Medical History: Denies  Family Psychiatric History: Psychiatric Dx:Denies Suicide Hx: Denies Violence/Aggression: Substance use:              Father -- alcohol abuse   Social History: Lives in [place] with mom and dad Social Support:  Education: Occupational hx: Marital Status:single Children: 80 yo and 3 yo, with parents. Reports she never used arundher children Legal:no upcoming court dates Military:denies Access to firearms: denies   Current Medications:  Current Facility-Administered Medications  Medication Dose Route Frequency Provider Last Rate Last Admin   acetaminophen (TYLENOL) tablet 650 mg  650 mg Oral Q6H PRN Weber, Kyra A, NP   650 mg at 04/04/23 1858   alum & mag hydroxide-simeth (MAALOX/MYLANTA) 200-200-20 MG/5ML suspension 30 mL  30 mL Oral Q4H PRN Weber, Kyra A, NP       buprenorphine-naloxone (SUBOXONE) 2-0.5 mg per SL tablet 1 tablet  1 tablet Sublingual Q12H Carrion-Carrero, Ahmar Pickrell, MD   1 tablet at 04/09/23 0831   dicyclomine (BENTYL) tablet 20 mg  20 mg Oral Q6H PRN Sindy Guadeloupe, NP       haloperidol (HALDOL) tablet 5 mg  5 mg Oral TID PRN Weber, Bella Kennedy A, NP       And   diphenhydrAMINE (BENADRYL) capsule 50 mg  50 mg Oral TID PRN Weber, Kyra A, NP       haloperidol lactate (HALDOL) injection 5 mg  5 mg Intramuscular TID PRN Weber, Kyra A, NP       And   diphenhydrAMINE (BENADRYL) injection 50 mg  50 mg Intramuscular TID PRN Weber, Bella Kennedy A, NP       And   LORazepam (ATIVAN) injection 2 mg  2 mg Intramuscular TID PRN Weber, Bella Kennedy A, NP       haloperidol lactate (HALDOL) injection 10 mg  10 mg Intramuscular TID PRN Weber, Kyra A, NP       And   diphenhydrAMINE (BENADRYL) injection 50 mg  50 mg Intramuscular TID PRN Weber, Bella Kennedy A, NP       And   LORazepam (ATIVAN) injection 2 mg  2 mg Intramuscular TID PRN Weber, Bella Kennedy A, NP       hydrOXYzine (ATARAX) tablet 25 mg  25 mg Oral Q6H PRN Sindy Guadeloupe, NP   25 mg at 04/05/23 0950   loperamide (IMODIUM) capsule 2-4 mg  2-4 mg Oral PRN Sindy Guadeloupe, NP       magnesium hydroxide (MILK OF MAGNESIA) suspension 30 mL  30 mL Oral Daily PRN Weber, Kyra A, NP        methocarbamol (ROBAXIN) tablet 500 mg  500 mg Oral Q8H PRN Sindy Guadeloupe, NP   500 mg at 04/05/23 2117   mirtazapine (REMERON) tablet 45 mg  45 mg Oral QHS Carrion-Carrero, Cristalle Rohm, MD   45 mg at 04/08/23 2107   multivitamin with minerals tablet 1 tablet  1 tablet Oral Daily Carrion-Carrero, Dessiree Sze, MD   1 tablet at 04/09/23 1914   naproxen (NAPROSYN) tablet 500 mg  500 mg Oral BID PRN Sindy Guadeloupe, NP   500 mg at 04/05/23 2118   nicotine (NICODERM CQ - dosed in mg/24 hours) patch 14 mg  14 mg Transdermal Daily Carrion-Carrero, Bart Ashford, MD       ondansetron (ZOFRAN-ODT) disintegrating tablet 4 mg  4 mg Oral Q6H PRN Sindy Guadeloupe, NP       traZODone (DESYREL) tablet 100 mg  100 mg Oral QHS PRN Lorri Frederick, MD   100 mg at 04/08/23 2107   Current Outpatient  Medications  Medication Sig Dispense Refill   buprenorphine-naloxone (SUBOXONE) 2-0.5 mg SUBL SL tablet Place 1 tablet under the tongue every 12 (twelve) hours. 4 tablet 0   mirtazapine (REMERON) 45 MG tablet Take 1 tablet (45 mg total) by mouth at bedtime. 7 tablet 0   [START ON 04/10/2023] Multiple Vitamin (MULTIVITAMIN WITH MINERALS) TABS tablet Take 1 tablet by mouth daily.     [START ON 04/10/2023] nicotine (NICODERM CQ - DOSED IN MG/24 HOURS) 14 mg/24hr patch Place 1 patch (14 mg total) onto the skin daily. 28 patch 0   traZODone (DESYREL) 100 MG tablet Take 1 tablet (100 mg total) by mouth at bedtime as needed. 7 tablet 0    PTA Medications:  Facility Ordered Medications  Medication   acetaminophen (TYLENOL) tablet 650 mg   alum & mag hydroxide-simeth (MAALOX/MYLANTA) 200-200-20 MG/5ML suspension 30 mL   magnesium hydroxide (MILK OF MAGNESIA) suspension 30 mL   haloperidol (HALDOL) tablet 5 mg   And   diphenhydrAMINE (BENADRYL) capsule 50 mg   haloperidol lactate (HALDOL) injection 5 mg   And   diphenhydrAMINE (BENADRYL) injection 50 mg   And   LORazepam (ATIVAN) injection 2 mg   haloperidol lactate (HALDOL)  injection 10 mg   And   diphenhydrAMINE (BENADRYL) injection 50 mg   And   LORazepam (ATIVAN) injection 2 mg   dicyclomine (BENTYL) tablet 20 mg   hydrOXYzine (ATARAX) tablet 25 mg   loperamide (IMODIUM) capsule 2-4 mg   methocarbamol (ROBAXIN) tablet 500 mg   naproxen (NAPROSYN) tablet 500 mg   ondansetron (ZOFRAN-ODT) disintegrating tablet 4 mg   [COMPLETED] traZODone (DESYREL) tablet 100 mg   traZODone (DESYREL) tablet 100 mg   mirtazapine (REMERON) tablet 45 mg   nicotine (NICODERM CQ - dosed in mg/24 hours) patch 14 mg   multivitamin with minerals tablet 1 tablet   [EXPIRED] LORazepam (ATIVAN) tablet 1 mg   buprenorphine-naloxone (SUBOXONE) 2-0.5 mg per SL tablet 1 tablet   PTA Medications  Medication Sig   [START ON 04/10/2023] Multiple Vitamin (MULTIVITAMIN WITH MINERALS) TABS tablet Take 1 tablet by mouth daily.   [START ON 04/10/2023] nicotine (NICODERM CQ - DOSED IN MG/24 HOURS) 14 mg/24hr patch Place 1 patch (14 mg total) onto the skin daily.   buprenorphine-naloxone (SUBOXONE) 2-0.5 mg SUBL SL tablet Place 1 tablet under the tongue every 12 (twelve) hours.   traZODone (DESYREL) 100 MG tablet Take 1 tablet (100 mg total) by mouth at bedtime as needed.   mirtazapine (REMERON) 45 MG tablet Take 1 tablet (45 mg total) by mouth at bedtime.       04/09/2023   10:21 AM 04/08/2023    9:45 AM 04/05/2023   11:45 AM  Depression screen PHQ 2/9  Decreased Interest 0 2 0  Down, Depressed, Hopeless 1 0 0  PHQ - 2 Score 1 2 0  Altered sleeping 0 0   Tired, decreased energy 0 2   Change in appetite 0 1   Feeling bad or failure about yourself  0 1   Trouble concentrating 0 0   Moving slowly or fidgety/restless 0 0   Suicidal thoughts 0 0   PHQ-9 Score 1 6     Flowsheet Row ED from 04/04/2023 in [facility] ED from 04/03/2023 in [facility] ED from 03/20/2023 in [facility]  C-SSRS  RISK CATEGORY No Risk No Risk No Risk  Musculoskeletal  Strength & Muscle Tone: within normal limits Gait & Station: normal Patient leans: N/A  Psychiatric Specialty Exam  Presentation  General Appearance:  Appropriate for Environment  Eye Contact: Good  Speech: Clear and Coherent  Speech Volume: Normal  Handedness: Right   Mood and Affect  Mood: Euthymic  Affect: Congruent   Thought Process  Thought Processes: Coherent  Descriptions of Associations:Intact  Orientation:Full (Time, Place and Person)  Thought Content:Logical     Hallucinations:Hallucinations: None  Ideas of Reference:None  Suicidal Thoughts:Suicidal Thoughts: No  Homicidal Thoughts:Homicidal Thoughts: No   Sensorium  Memory: Immediate Good; Recent Good; Remote Good  Judgment: Good  Insight: Fair   Art therapist  Concentration: Fair  Attention Span: Good  Recall: Good  Fund of Knowledge: Fair  Language: Good   Psychomotor Activity  Psychomotor Activity: Psychomotor Activity: Normal   Assets  Assets: Desire for Improvement   Sleep  Sleep: Sleep: Fair   Nutritional Assessment (For OBS and FBC admissions only) Has the patient had a weight loss or gain of 10 pounds or more in the last 3 months?: No Has the patient had a decrease in food intake/or appetite?: No Does the patient have dental problems?: No Does the patient have eating habits or behaviors that may be indicators of an eating disorder including binging or inducing vomiting?: No Has the patient recently lost weight without trying?: 0 Has the patient been eating poorly because of a decreased appetite?: 0 Malnutrition Screening Tool Score: 0    Physical Exam  Physical Exam Vitals and nursing note reviewed.  Constitutional:      General: She is not in acute distress.    Appearance: Normal appearance. She is not ill-appearing.  HENT:     Head: Normocephalic and atraumatic.   Eyes:     Extraocular Movements: Extraocular movements intact.     Conjunctiva/sclera: Conjunctivae normal.  Pulmonary:     Effort: Pulmonary effort is normal. No respiratory distress.  Musculoskeletal:        General: Normal range of motion.  Skin:    General: Skin is warm and dry.  Neurological:     Mental Status: She is alert.    Review of Systems  All other systems reviewed and are negative.  Blood pressure 111/71, pulse 73, temperature 97.8 F (36.6 C), temperature source Oral, resp. rate 16, SpO2 99%. There is no height or weight on file to calculate BMI.  Demographic Factors:  Caucasian  Loss Factors: NA  Historical Factors: Impulsivity  Risk Reduction Factors:   Responsible for children under 73 years of age, Sense of responsibility to family, Living with another person, especially a relative, Positive social support, Positive therapeutic relationship, and Positive coping skills or problem solving skills  Continued Clinical Symptoms:  Alcohol/Substance Abuse/Dependencies Unstable or Poor Therapeutic Relationship Previous Psychiatric Diagnoses and Treatments  Cognitive Features That Contribute To Risk:  None    Suicide Risk:  Mild:  Suicidal ideation of limited frequency, intensity, duration, and specificity.  There are no identifiable plans, no associated intent, mild dysphoria and related symptoms, good self-control (both objective and subjective assessment), few other risk factors, and identifiable protective factors, including available and accessible social support.  Plan Of Care/Follow-up recommendations:  Activity: as tolerated  Diet: heart healthy  Other: -Follow-up with your outpatient psychiatric provider -instructions on appointment date, time, and address (location) are provided to you in discharge paperwork.  -Take your psychiatric medications as prescribed at discharge - instructions are provided to you in  the discharge paperwork  -If you are  prescribed an atypical antipsychotic medication, we recommend that your outpatient psychiatrist follow routine screening for side effects within 3 months of discharge, including monitoring: AIMS scale, height, weight, blood pressure, fasting lipid panel, HbA1c, and fasting blood sugar.   -Recommend total abstinence from alcohol, tobacco, and other illicit drug use at discharge.   -If your psychiatric symptoms recur, worsen, or if you have side effects to your psychiatric medications, call your outpatient psychiatric provider, 911, [phone] or go to the nearest emergency department.  -If suicidal thoughts occur, immediately call your outpatient psychiatric provider, 911, [phone] or go to the nearest emergency department.   Disposition: Patient is discharged home with family with plan to follow up virtually with Dr. Randa Evens at [facility] on Wed March 19th for medication management. Patient will also have CD-IOP services arranged on same day at [facility].   Lorri Frederick, MD 04/09/2023, 10:21 AM

## 2023-04-09 NOTE — ED Notes (Signed)
 Patient alert & oriented x4. Denies intent to harm self or others when asked. Denies A/VH. Patient denies any physical complaints when asked. No acute distress noted. Support and encouragement provided. Scheduled medications administered with no complications. Patient planned to discharge this am, no concerns related to this plan at this time. Routine safety checks conducted per facility protocol. Encouraged patient to notify staff if any thoughts of harm towards self or others arise. Patient verbalizes understanding and agreement.

## 2023-04-09 NOTE — ED Notes (Signed)
 Patient sitting in dayroom interacting with peers. No acute distress noted. No concerns voiced. Informed patient to notify staff with any needs or assistance. Patient verbalized understanding or agreement. Safety checks in place per facility policy.

## 2023-04-09 NOTE — ED Notes (Signed)
 Patient is sleeping. Respirations equal and unlabored, skin warm and dry. No change in assessment or acuity. Routine safety checks conducted according to facility protocol. Will continue to monitor for safety.

## 2023-04-09 NOTE — Group Note (Signed)
 Group Topic: Social Support  Group Date: 04/09/2023 Start Time: 1030 End Time: 1106 Facilitators: Ananda Caya, Jacklynn Barnacle, RN  Department: [facility]  Number of Participants: 6  Group Focus: abuse issues Treatment Modality:  Interpersonal Therapy Interventions utilized were exploration and support Purpose: improve communication skills  Name: Phyllis Goodman Date of Birth: 1995/07/15  MR: [id number]    Level of Participation: moderate Quality of Participation: attentive and cooperative Interactions with others: gave feedback Mood/Affect: appropriate Triggers (if applicable): None noted Cognition: coherent/clear and logical Progress: Gaining insight Response: Patient actively participated in group Plan: patient will be encouraged to continue to attend groups  Patients Problems:  Patient Active Problem List   Diagnosis Date Noted   Methamphetamine use disorder, moderate (HCC) 04/04/2023   Methamphetamine use disorder, moderate, dependence (HCC) 04/03/2023   History of endocarditis 03/20/2022   VBAC, delivered 09/20/2019   PPH (postpartum hemorrhage) 09/20/2019   Post-dates pregnancy 09/19/2019   Group beta Strep positive 08/22/2019   History of cesarean section 06/26/2019   Prior pregnancy with congenital cardiac defect, antepartum 06/26/2019   Rh negative state in antepartum period 06/17/2019   Supervision of other normal pregnancy, antepartum 05/14/2019   History of drug abuse (HCC) 07/28/2015   ADHD (attention deficit hyperactivity disorder), combined type 06/28/2015   Dysgraphia 06/28/2015

## 2023-04-09 NOTE — Discharge Planning (Signed)
 LCSW spoke with MD and patient on this morning. Patient expresses an interest in CDIOP services at discharge. LCSW reached out to Alene Mires, LCSW to explore availability for services. Per Chrissie Noa, Intake Assessment has been scheduled for 04/11/2023 at 2:30pm for CDIOP services. Patient also aware of appts scheduled with established provider. Information has been added to the patient's AVS and patient made aware. Patient was appreciative of LCSW support. Patient reports her plan is to return home with family on today with transportation provided by her mother. No other concerns to report at this time. LCSW to sign off. Please inform if further LCSW needs arise prior to discharge.   Fernande Boyden, LCSW Clinical Social Worker [facility] Ph: [phone]

## 2023-04-09 NOTE — ED Notes (Signed)
 Pt eating breakfast in the dayroom watching tv at the current. Pt denies SI, HI, pain, and AVH. No s/s of distress observed or voiced. Will continue to monitor for safety and report any changes.

## 2023-04-09 NOTE — ED Notes (Signed)
 Patient sitting in dayroom interacting with peers. Attending AA group. No acute distress noted. No concerns voiced. Informed patient to notify staff with any needs or assistance. Patient verbalized understanding or agreement. Safety checks in place per facility policy.

## 2023-04-09 NOTE — Group Note (Signed)
 Group Topic: Recovery Basics  Group Date: 04/09/2023 Start Time: 1110 End Time: 1116 Facilitators: Wonda Cheng, LPN  Department: [facility]  Number of Participants: 1  Group Focus: discharge education Treatment Modality:  Individual Therapy Interventions utilized were patient education Purpose: increase insight  Name: DUYEN BECKOM Date of Birth: 07/28/1995  MR: [id number]    Level of Participation: active Quality of Participation: cooperative, engaged, and offered feedback Interactions with others: gave feedback Mood/Affect: appropriate and positive Triggers (if applicable): n/a Cognition: coherent/clear and insightful Progress: Gaining insight Response: appropriate response to therapy Plan: patient will be encouraged to refer to suicide safety plan as needed.  Patients Problems:  Patient Active Problem List   Diagnosis Date Noted   Methamphetamine use disorder, moderate (HCC) 04/04/2023   Methamphetamine use disorder, moderate, dependence (HCC) 04/03/2023   History of endocarditis 03/20/2022   VBAC, delivered 09/20/2019   PPH (postpartum hemorrhage) 09/20/2019   Post-dates pregnancy 09/19/2019   Group beta Strep positive 08/22/2019   History of cesarean section 06/26/2019   Prior pregnancy with congenital cardiac defect, antepartum 06/26/2019   Rh negative state in antepartum period 06/17/2019   Supervision of other normal pregnancy, antepartum 05/14/2019   History of drug abuse (HCC) 07/28/2015   ADHD (attention deficit hyperactivity disorder), combined type 06/28/2015   Dysgraphia 06/28/2015

## 2023-04-11 ENCOUNTER — Ambulatory Visit (INDEPENDENT_AMBULATORY_CARE_PROVIDER_SITE_OTHER): Admitting: Licensed Clinical Social Worker

## 2023-04-11 DIAGNOSIS — F1120 Opioid dependence, uncomplicated: Secondary | ICD-10-CM | POA: Diagnosis not present

## 2023-04-11 DIAGNOSIS — F1520 Other stimulant dependence, uncomplicated: Secondary | ICD-10-CM

## 2023-04-11 DIAGNOSIS — F341 Dysthymic disorder: Secondary | ICD-10-CM | POA: Diagnosis not present

## 2023-04-11 DIAGNOSIS — F17290 Nicotine dependence, other tobacco product, uncomplicated: Secondary | ICD-10-CM

## 2023-04-11 NOTE — Progress Notes (Deleted)
 [facility]  Blew off meetings  Stopped two months before
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
## 2023-04-13 ENCOUNTER — Ambulatory Visit (HOSPITAL_COMMUNITY)

## 2023-04-13 DIAGNOSIS — F1520 Other stimulant dependence, uncomplicated: Secondary | ICD-10-CM

## 2023-04-13 DIAGNOSIS — F1120 Opioid dependence, uncomplicated: Secondary | ICD-10-CM

## 2023-04-13 NOTE — Progress Notes (Signed)
 Daily Group Progress Note   Program: CD IOP     Group Time: 9 a.m. to 12 p.m.      Type of Therapy: Process and Psychoeducational    Topic: The therapists check in with group members, assess for SI/HI/psychosis and overall level of functioning. The therapists inquire about sobriety date and number of community support meetings attended since last session.  The therapists shared a presentation with the group addressing the following issues:  definition of addiction showing brain images that demonstrate the normal brain, the brain in active addiction and the brain in 14 month recovery; Categories of substance Use treatment; addiction as a brain disease and what keeps addicts from seeing their addiction; conceptualizing addiction as a disease and the role of social learning that free will can overcome this particular disease; discussed one of the primary diagnostic features of addiction being "loss of control"; ASAM Grid, Stages of Change by Camille Bal and Diclemente, Socrates test to measure one's perception of them having an issue with drugs/alcohol; Abstinence Stage, Post Acute Withdrawal Syndrome, Causes of Relapse in Late Recovery, Med assisted Treatment.    Summary: [name] rates her depression today as a "5" and her anxiety as a "7". She reports no change in her sobriety date. She says she has not attended any meetings and has no sponsor.  [name] looked groggy, at some points in today's session. She was otherwise attentive but quiet.   Progress Towards Goals: Calianne reports no change in her sobriety date.     UDS collected: No  Results: negative   AA/NA attended?:  Yes  Sponsor?:  Yes  Remigio Eisenmenger, MS, LMFT, LCAS [address], [place], [place], [facility], LCAS 04/13/2023

## 2023-04-16 ENCOUNTER — Ambulatory Visit (HOSPITAL_COMMUNITY): Admitting: Medical

## 2023-04-16 VITALS — BP 132/78 | HR 90 | Ht 67.0 in | Wt 147.0 lb

## 2023-04-16 DIAGNOSIS — F1120 Opioid dependence, uncomplicated: Secondary | ICD-10-CM

## 2023-04-16 DIAGNOSIS — T7411XS Adult physical abuse, confirmed, sequela: Secondary | ICD-10-CM | POA: Diagnosis not present

## 2023-04-16 DIAGNOSIS — F1910 Other psychoactive substance abuse, uncomplicated: Secondary | ICD-10-CM

## 2023-04-16 DIAGNOSIS — Z8759 Personal history of other complications of pregnancy, childbirth and the puerperium: Secondary | ICD-10-CM

## 2023-04-16 DIAGNOSIS — F1520 Other stimulant dependence, uncomplicated: Secondary | ICD-10-CM

## 2023-04-16 DIAGNOSIS — Z8659 Personal history of other mental and behavioral disorders: Secondary | ICD-10-CM

## 2023-04-16 DIAGNOSIS — I38 Endocarditis, valve unspecified: Secondary | ICD-10-CM

## 2023-04-16 DIAGNOSIS — F4310 Post-traumatic stress disorder, unspecified: Secondary | ICD-10-CM

## 2023-04-16 DIAGNOSIS — O09299 Supervision of pregnancy with other poor reproductive or obstetric history, unspecified trimester: Secondary | ICD-10-CM

## 2023-04-16 DIAGNOSIS — Z8679 Personal history of other diseases of the circulatory system: Secondary | ICD-10-CM

## 2023-04-16 DIAGNOSIS — F17290 Nicotine dependence, other tobacco product, uncomplicated: Secondary | ICD-10-CM | POA: Diagnosis not present

## 2023-04-16 NOTE — Progress Notes (Signed)
 Daily Group Progress Note   Program: CD IOP     Group Time: 9 a.m. to 12 p.m.      Type of Therapy: Process and Psychoeducational    Topic: The therapists check in with group members, assess for SI/HI/psychosis and overall level of functioning. The therapists inquire about sobriety date and number of community support meetings attended since last session.  Therapists present information on Medication Assisted Treatment (MAT): Discussed benefits given relapse rates, particularly regarding Opioid Use.  FACTS presented include the following: MAT reduces overdose risks in half. MAT provides a whole person approach. MAT makes recovery possible, noting 90 percent of pts who use MAT maintain sobriety at a 2 year mark. 90 percent chance of relapse in the first year. MYTH presented include the following: MAT is trading one addiction for another. If you are still using (MAT) you are not really in recovery. There is no proof that MAT is more effective than abstinence. There are dangerous side effects. MAT is just another way for MD's to prescribed medications. MAT is like liquid handcuffs.  Therapists provide psycho-education on Cannabis Use including discussing the following issues: cognitive, physical health and mental health impairments it can cause. Therapists point out the statistic that 3 in 10 cannabis users develop cannabis use disorder. Therapists discuss how persons who use cannabis would benefit from being evaluated for Bipolar Disorder. As cannabis can help stabile mood, as disguise an underlying disorder.  Therapists discuss how cannabis use is associated with an increased risk of Alcohol Use Disorders.  In response to a group member asking how long symptoms could last after ceasing to use substances, therapist discuss Post-Acute Withdrawal Syndrome.  Therapist also discuss and process with the group on viewing recovery as a program of attraction, rather than "chasing" after those who are in active  addiction to convince them to stop using/enter treatment.    Summary: Anaiah rates her depression and anxiety as a "7" today. Shantrice reports no change in her sobriety date. She has not yet attended meetings, nor does she have a sponsor. One of the group members invited her to a meeting after IOP. She says she could not make it today but will plan on it for Wednesday after group.  Refugia identifies her emotions today as "happy and nervous". This is [facility] second IOP group. She says she does not talk much in group as it makes her nervous.   Progress Towards Goals: reports the same sobriety date.  UDS collected: Yes Results: no   AA/NA attended?: No  Sponsor?:  No  Remigio Eisenmenger, MS, LMFT, LCAS [address], [place], [place], [facility], LCAS 04/16/2023
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
## 2023-04-17 ENCOUNTER — Telehealth (HOSPITAL_COMMUNITY): Payer: Self-pay

## 2023-04-17 NOTE — Telephone Encounter (Signed)
 Maryjean Morn, PA-C asked this therapist to call [name] today, as her white blood count was around 24,000 to ask her to go get it checked as it is out of the normal range.  Therapist calls Caro to ask her to do this, however therapist reaches Voice Mail.  Therapist leaves a HIPAA compliant message requesting her to call this therapist ASAP.  Remigio Eisenmenger, MS, LMFT, LCAS  04/17/23

## 2023-04-18 ENCOUNTER — Ambulatory Visit (INDEPENDENT_AMBULATORY_CARE_PROVIDER_SITE_OTHER)

## 2023-04-18 ENCOUNTER — Other Ambulatory Visit (INDEPENDENT_AMBULATORY_CARE_PROVIDER_SITE_OTHER)

## 2023-04-18 DIAGNOSIS — F1520 Other stimulant dependence, uncomplicated: Secondary | ICD-10-CM

## 2023-04-18 DIAGNOSIS — F4310 Post-traumatic stress disorder, unspecified: Secondary | ICD-10-CM | POA: Diagnosis not present

## 2023-04-18 DIAGNOSIS — Z79899 Other long term (current) drug therapy: Secondary | ICD-10-CM

## 2023-04-18 DIAGNOSIS — F1120 Opioid dependence, uncomplicated: Secondary | ICD-10-CM

## 2023-04-18 NOTE — Progress Notes (Signed)
 Daily Group Progress Note   Program: CD IOP     Group Time: 9 a.m. to 12 p.m.      Type of Therapy: Process and Psychoeducational    Topic: The therapists check in with group members, assess for SI/HI/psychosis and overall level of functioning. The therapists inquire about sobriety date and number of community support meetings attended since last session.  The therapist discusses the process of the brain healing when one engages in recovery.  Shared information on  12 step meetings assist one in addiction with recovery including getting a sponsor.  Therapist shares information from the [facility] including "Trigger-Thought-Craving-Use, Triggers in general, Internal and External Triggers. Therapist prompts group member to identify what their external and internal triggers are.  Summary: Phyllis Goodman rates her depression today as a "7" and her anxiety as a "7".  She reports the same sobriety date. She says she has not been to any meetings, nor does she have a sponsor. She discusses possibilities of meeting to attend, inquiring what meetings her peers prefer. Phyllis Goodman shares her story with the group today, noting she is able to talk in smaller groups. Phyllis Goodman identifies some of her internal triggers as follows: nervous, depressed, insecure, bored, lonely, worried and overwhelmed. She shares external triggers she identifies with including, being home alone, at a friend's house as all her friends use, before sexual activities, after sexual activities, after doing past a dealer's residence, after passing a particular street or exit and when in pain. Phyllis Goodman shares situations where she has frequent use. She states she has frequently used behind dumpsters and sitting in parking lots.   Progress Towards Goals: Phyllis Goodman reports no change in her sobriety date.     UDS collected: No  Results: negative   AA/NA attended?:    Sponsor?:    Remigio Eisenmenger, MS, LMFT, LCAS  04/18/2023

## 2023-04-18 NOTE — Progress Notes (Cosign Needed)
 Pt tolerated lab draws in right hand with no complaints.    JNL, CMA

## 2023-04-19 LAB — CBC WITH DIFFERENTIAL/PLATELET
Basophils Absolute: 0 10*3/uL (ref 0.0–0.2)
Basos: 1 %
EOS (ABSOLUTE): 0.3 10*3/uL (ref 0.0–0.4)
Eos: 5 %
Hematocrit: 37.7 % (ref 34.0–46.6)
Hemoglobin: 12.5 g/dL (ref 11.1–15.9)
Immature Grans (Abs): 0 10*3/uL (ref 0.0–0.1)
Immature Granulocytes: 1 %
Lymphocytes Absolute: 2.2 10*3/uL (ref 0.7–3.1)
Lymphs: 39 %
MCH: 30.4 pg (ref 26.6–33.0)
MCHC: 33.2 g/dL (ref 31.5–35.7)
MCV: 92 fL (ref 79–97)
Monocytes Absolute: 0.4 10*3/uL (ref 0.1–0.9)
Monocytes: 7 %
Neutrophils Absolute: 2.7 10*3/uL (ref 1.4–7.0)
Neutrophils: 47 %
Platelets: 328 10*3/uL (ref 150–450)
RBC: 4.11 x10E6/uL (ref 3.77–5.28)
RDW: 12.9 % (ref 11.7–15.4)
WBC: 5.6 10*3/uL (ref 3.4–10.8)

## 2023-04-20 ENCOUNTER — Other Ambulatory Visit (HOSPITAL_COMMUNITY): Payer: Self-pay | Admitting: Medical

## 2023-04-20 ENCOUNTER — Ambulatory Visit (INDEPENDENT_AMBULATORY_CARE_PROVIDER_SITE_OTHER)

## 2023-04-20 DIAGNOSIS — F1520 Other stimulant dependence, uncomplicated: Secondary | ICD-10-CM

## 2023-04-20 DIAGNOSIS — F1120 Opioid dependence, uncomplicated: Secondary | ICD-10-CM

## 2023-04-20 MED ORDER — BUPRENORPHINE HCL-NALOXONE HCL 8-2 MG SL SUBL: 1.0000 | SUBLINGUAL_TABLET | Freq: Three times a day (TID) | SUBLINGUAL | 0 refills | Status: AC

## 2023-04-20 NOTE — Progress Notes (Signed)
 [facility] rx for Suboxone for 8/2 mg tabs#33 for 11 days til can be seen at [facility] She was on TID Zubsolv 5.7

## 2023-04-20 NOTE — Progress Notes (Signed)
 Daily Group Progress Note   Program: CD IOP     Group Time: 9 a.m. to 12 p.m.      Type of Therapy: Process and Psychoeducational    Topic: The therapists check in with group members, assess for SI/HI/psychosis and overall level of functioning. The therapists inquire about sobriety date and number of community support meetings attended since last session.  Therapists read the Just for today which was entitled "Feeling Feelings".  Therapist prompted discussion on this subject.  Therapist introduced Distress Tolerance skills and discussed how this is a way to deal with emotions to decrease the intensity until they disappear.  Therapists explain the ACCEPTS as way to distract one from the intensity of emotions: (A) Activities, (C) Contributing, (C) Comparisons, (E) Emotions, (P) Pushing away, (T) Thoughts and Sensations.  Therapists asks for examples for each of these concepts. Therapists further discuss how Radical Acceptance is means of dealing with intense emotions. Radical Acceptance being about fully accepting something, mentally and emotionally fully while not necessarily liking or feeling something is fair.    Summary: Phyllis Goodman rates her depression today as a "5" and her anxiety as a "7".  She reports the same sober date.  She says she has not attended any meetings, nor does she have a sponsor. Phyllis Goodman says that she is planning to go to a meeting with a group member peer over the weekend.    She identifies her emotions as "happy, optimistic and frustrated". Phyllis Goodman says she is frustrated because of her family dynamic. She explains that her father is an alcoholic, has a trauma history and drinks in front of her two sons and will yell at them.  She says her mother cannot make him stop drinking and he does not think he has a problem. Phyllis Goodman shares that she is not able to move out due to finances and she has to get her finances more stable and have a residence of her own before she could seek custody  of them again. Phyllis Goodman says she has had thoughts of using admist the family distress.  In response to the distress tolerance skills, Phyllis Goodman says she would participate in the activity of baking to distract herself from intense emotions. Phyllis Goodman says she has an opportunity to bake and sell her goods and this will help her financially.   Progress Towards Goals: Phyllis Goodman reports no change in her sobriety date.     UDS collected: No  Results: negative   AA/NA attended?:  none  Sponsor?:  No  Remigio Eisenmenger, MS, LMFT, [address] [place], [place], [place], [facility], LCAS  04/20/2023

## 2023-04-23 ENCOUNTER — Ambulatory Visit (INDEPENDENT_AMBULATORY_CARE_PROVIDER_SITE_OTHER): Admitting: Licensed Clinical Social Worker

## 2023-04-23 DIAGNOSIS — F1520 Other stimulant dependence, uncomplicated: Secondary | ICD-10-CM

## 2023-04-23 DIAGNOSIS — F17290 Nicotine dependence, other tobacco product, uncomplicated: Secondary | ICD-10-CM

## 2023-04-23 DIAGNOSIS — F4310 Post-traumatic stress disorder, unspecified: Secondary | ICD-10-CM

## 2023-04-23 DIAGNOSIS — F1120 Opioid dependence, uncomplicated: Secondary | ICD-10-CM

## 2023-04-23 NOTE — Progress Notes (Signed)
 Daily Group Progress Note   Program: CD IOP     Group Time: 9 a.m. to 12 p.m.       Type of Therapy: Process and Psychoeducational    Topic: The therapists check in with group members, assess for SI/HI/psychosis and overall level of functioning. The therapists inquire about sobriety date and number of community support meetings attended since last session.    The therapists show the first half of the video, "Breaking the Addiction Cycle," having group members complete and discuss the first few exercises.    Summary: Phyllis Goodman presents rating her depression as a 5 and her anxiety as a 7. In completing today's group exercises, she says that she used to believe that an alcoholic was a homeless person with a brown paper bag and a drugs addict was a person with his or her teeth falling out who was shooting up and a criminal.  Phyllis Goodman says that her favorite denial for not being an addict was that she did not have a "huge criminal record," that she finished school and graduated, and that she was "always a good person."   Phyllis Goodman says that she was supposed to go to an in person meeting with a woman from group who just graduated but it fell through but they may attend this week. The therapist suggests Phyllis Goodman try some virtual which she is more willing to do upon learning that she can attend and does not have to turn on her camera or microphone. She says that she has started attending Church on Sundays.   She describes her mood as "good" and "optimistic" and sets a date for her family meeting as her mother indicated that she would attend with her father suggesting that he may attend while having not said "no" thus far.    Progress Towards Goals: Phyllis Goodman reports no change in her sobriety date.     UDS collected: Yes Results: No   AA/NA attended?:  No   Sponsor?:  No   Alene Mires, MA, [place], [facility], LCAS Remigio Eisenmenger, [place], LCAS 04/23/2023

## 2023-04-25 ENCOUNTER — Ambulatory Visit (INDEPENDENT_AMBULATORY_CARE_PROVIDER_SITE_OTHER): Admitting: Licensed Clinical Social Worker

## 2023-04-25 DIAGNOSIS — F1120 Opioid dependence, uncomplicated: Secondary | ICD-10-CM

## 2023-04-25 DIAGNOSIS — F17290 Nicotine dependence, other tobacco product, uncomplicated: Secondary | ICD-10-CM

## 2023-04-25 DIAGNOSIS — F1520 Other stimulant dependence, uncomplicated: Secondary | ICD-10-CM

## 2023-04-25 DIAGNOSIS — F4310 Post-traumatic stress disorder, unspecified: Secondary | ICD-10-CM

## 2023-04-25 NOTE — Progress Notes (Signed)
 Daily Group Progress Note   Program: CD IOP     Group Time: 9 a.m. to 12 p.m.      Type of Therapy: Process and Psychoeducational    Topic: The therapists check in with group members, assess for SI/HI/psychosis and overall level of functioning. The therapists inquire about sobriety date and number of community support meetings attended since last session.    The therapists facilitate group discussions and present information about the treatment interfering impact of cannabis on bipolar disorder, the fact that the First Step in recovery is not only a thinking step but an action step, what a resentment chip is and why people pick them up, the need to autopsy a relapse so as to learn from it and make changes, and the fact that a Sponsee helps a Sponsor stay sober in addition to the Sponsor helping the Sheldon.    Summary: Jewelz presents rating her depression as a 5 and her anxiety as a 7. She describes her mood as "happy," "grateful," and "optimistic."   She has not attended a meeting but says that she will attend a virtual meeting today. She says that the work with her sister is picking up.  Kelina says that her father has agreed to attend the family meeting along with her mother; however, she must reschedule it from today until next Friday when her father is off.   She expresses a great deal of anxiety about the family meeting but says that what she most wants her parents to understand is that addiction is a disease. She talks about her father's prior histories of DWIs wondering why he was never ordered to treatment. At the same time, she talks about how her parents pressured her on the past to get help with Elayna telling them that she had to do it when she was ready.  The therapist informs Moni that most people who seek substance-specific treatment at least initially do so in response to some type of external push from family, the legal system, one's employer, etcetera.    Progress  Towards Goals: Vaughn reports no change in her sobriety date.    UDS collected: No Results: No   AA/NA attended?:  No  Sponsor?:  No   Alene Mires, MA, [place], [facility], LCAS Remigio Eisenmenger, [place], LCAS 04/25/2023

## 2023-04-26 ENCOUNTER — Telehealth (HOSPITAL_COMMUNITY): Payer: Self-pay | Admitting: *Deleted

## 2023-04-26 NOTE — Telephone Encounter (Signed)
 Genesight testing complete, submitted online and [employer] scheduled to pick up tomorrow between 8a-11a.

## 2023-04-27 ENCOUNTER — Ambulatory Visit (HOSPITAL_COMMUNITY)

## 2023-04-27 DIAGNOSIS — F1520 Other stimulant dependence, uncomplicated: Secondary | ICD-10-CM

## 2023-04-27 NOTE — Progress Notes (Signed)
 Daily Group Progress Note   Program: CD IOP     Group Time: 10:30 am to 12:20 pm     Type of Therapy: Process and Psychoeducational    Topic: The therapists check in with group members, assess for SI/HI/psychosis and overall level of functioning. The therapists inquire about sobriety date and number of community support meetings attended since last session.    The therapists check in with group members, assess for SI/HI/psychosis and overall level of functioning. The therapists inquire about sobriety date and number of community support meetings attended since last session.     The therapists discuss the fact that Cannabis is a drug and that whether it is legal or not has no connection to whether it is addictive and thus can cause a user's life to become unmanageable. The therapist also educates users about other substances to avoid, such as Kratom, which is legal in [place]. The therapist answers group members' question about Naltrexone and shows a two minute video on Baclofen by Dr. Theda Belfast.  The therapist shows the second half of the "Breaking the Addiction Cycle" video having group members complete the exercise on describing their using ritual.   Summary: Phyllis Goodman presents at 10:30 today. She appears to be somewhat sedated. She reports her sobriety date has not changed. Phyllis Goodman discusses her how she has been talking to her ex and he is in addictive addiction. She expresses that she does not want to keep him out of their sons' lives.  Therapists inquire as to how much he is parenting them in active additive addiction.  She notes he has not been around them and she does not want him around them in the shape he is in. Phyllis Goodman verbalizes that her ex is asking her for money for drugs.  She says she was able to tell him "no".  Therapists caution her about having continued contact with him while he is in active addiction and elaborate on the risks of doing so.     Progress Towards Goals:  Phyllis Goodman reports no change in her sobriety date.    UDS collected: No Results: No   AA/NA attended?:  No  Sponsor?:  No   Alene Mires, MA, [place], [facility], LCAS Remigio Eisenmenger, [place], LCAS 04/27/2023

## 2023-04-30 ENCOUNTER — Encounter (HOSPITAL_COMMUNITY): Payer: Self-pay | Admitting: Medical

## 2023-04-30 ENCOUNTER — Ambulatory Visit (INDEPENDENT_AMBULATORY_CARE_PROVIDER_SITE_OTHER)

## 2023-04-30 DIAGNOSIS — F4310 Post-traumatic stress disorder, unspecified: Secondary | ICD-10-CM

## 2023-04-30 DIAGNOSIS — F1120 Opioid dependence, uncomplicated: Secondary | ICD-10-CM

## 2023-04-30 DIAGNOSIS — F17290 Nicotine dependence, other tobacco product, uncomplicated: Secondary | ICD-10-CM

## 2023-04-30 DIAGNOSIS — F1520 Other stimulant dependence, uncomplicated: Secondary | ICD-10-CM | POA: Diagnosis not present

## 2023-04-30 NOTE — Progress Notes (Addendum)
 Daily Group Progress Note   Program: CD IOP     Group Time: 9:00 am to 12:20 pm     Type of Therapy: Process and Psychoeducational    Topic: The therapists check in with group members, assess for SI/HI/psychosis and overall level of functioning. The therapists inquire about sobriety date and number of community support meetings attended since last session.    The therapists shows the last 2 minutes of "Breaking the Addiction Cycle". The therapists discussed the "ritual" and how to interrupt the cycle at this point.  Therapist gives an overall of how to treat Substance Use.  This discusses the following subjects and prompts group members to respond and ask questions: Definition of Substance Use, slides of the brain when healthy, on substances and in 14 months of recovery, categories of substance use disorders, levels of severity, ASAM criteria, stages of change model, Socrates test, Stages of Recovery including abstinence, repair stage, late stage and the cause of relapse. Acute and Post Acute Withdrawal syndrome and lengths of time for each, opioids and MAT including facts and myths, cannabis facts and myths and effects on health.  Summary: Phyllis Goodman presents in person today. She does not complete a daily questionnaire. She reports she is attending virtual meetings. She does not have a sponsor. She identifies her emotion today as "hopeful". Her last UDS test was negative. Phyllis Goodman shares that her medication makes her groggy the next day. She says she will talk to the MD about lowering the dose. She remains quiet for the remainder of the group.  When therapist ask is she has a take away from group, she says she learned that taking suboxone is not changing one drug for another.     Progress Towards Goals: Phyllis Goodman reports no change in her sobriety date.    UDS collected: Yes Results: Negative   AA/NA attended?:  Yes (virtual)  Sponsor?:  No   Alene Mires, MA, [place], [facility], LCAS Remigio Eisenmenger, [place],  LCAS 04/30/2023

## 2023-05-02 ENCOUNTER — Ambulatory Visit (INDEPENDENT_AMBULATORY_CARE_PROVIDER_SITE_OTHER)

## 2023-05-02 DIAGNOSIS — F1520 Other stimulant dependence, uncomplicated: Secondary | ICD-10-CM

## 2023-05-02 DIAGNOSIS — F17290 Nicotine dependence, other tobacco product, uncomplicated: Secondary | ICD-10-CM

## 2023-05-02 DIAGNOSIS — F4310 Post-traumatic stress disorder, unspecified: Secondary | ICD-10-CM

## 2023-05-02 DIAGNOSIS — F1120 Opioid dependence, uncomplicated: Secondary | ICD-10-CM

## 2023-05-02 NOTE — Progress Notes (Signed)
 Daily Group Progress Note   Program: CD IOP     Group Time: 9:00 am to 12:20 pm     Type of Therapy: Process and Psychoeducational    Topic: The therapists check in with group members, assess for SI/HI/psychosis and overall level of functioning. The therapists inquire about sobriety date and number of community support meetings attended since last session.      The therapists show the Viviann Spare Melimis video on Relapse Prevention which covers the stages of relapse, including emotional, mental and physical ages. Therapists discuss the emotional stage of relapse which involves a time when one is not necessarily thinking of using but one's emotions are setting them up for a future relapse. Therapists discuss the important of self care including daily activities of living such as eating, having a regular sleep schedule, exercising etc. From the emotional stage of relapse one can then transition into the mental stage of relapse. Therapist explain how this stage people with addictions start to think about use, however there is a part of one that wants to use and another part does not want to use. From there, starts to think about what one can use.  It is important to at this juncture to be able to distract to interrupt this thought process. Therapists share the last stage of relapse is the physical stage where one uses a substance. Therapists prompt group members to share times when they have seen themselves in the various stages. Therapists also discuss HALT (hungry, angry, loneliness and tired) as triggers in the relapse stage.  Summary: Jimya rates her depression and anxiety as a "5". [name] reports he attends [employer] though has not attended one in several days. She does not have a sponsor.  Lucile identifies her emotions as "happy and hopeful".  Lexington shares that she thought about using yesterday. She has she was able to distract herself in the mental stage of relapse. She says she distracted  her thoughts by going on a walk.   When talking about HALT, Sister says it is difficult to take care of her emotional needs as she does not have anyone to talk to. She says she does not want to burden anyone. She lives with her parents who she says do not understand addiction.  The only other person that contacts her is her sons father who contacts her in his asks for money to pay for his drugs. She says she struggles with blocking his number because he is her sons father. Therapists ask Sharine to consider what type of relationship her son's father could have with the son's while in active addiction. Therapist asks her to consider telling him when he calls again that when he gets out of active addiction and pursues treatment, she may reconsider her contact with him.  Therapists remind her that the therapist and her peers in the group are there for her, and that she is not alone and she can talk in group if she wants.     Progress Towards Goals: Neidra reports no change in her sobriety date.    UDS collected: No  Results: Negative   AA/NA attended?:  Not since last meeting  Sponsor?:  No   Alene Mires, MA, [place], [facility], LCAS Remigio Eisenmenger, [place], LCAS 05/02/2023

## 2023-05-04 ENCOUNTER — Ambulatory Visit (HOSPITAL_COMMUNITY)

## 2023-05-04 ENCOUNTER — Telehealth (HOSPITAL_COMMUNITY): Payer: Self-pay | Admitting: Licensed Clinical Social Worker

## 2023-05-04 NOTE — Telephone Encounter (Signed)
 The therapist attempts to reach Phyllis Goodman after she no shows for IOP today leaving a HIPAA-compliant voicemail.  Myrna Blazer, MA, LCSW, [facility], LCAS 05/04/2023

## 2023-05-05 DIAGNOSIS — Z419 Encounter for procedure for purposes other than remedying health state, unspecified: Secondary | ICD-10-CM | POA: Diagnosis not present

## 2023-05-07 ENCOUNTER — Ambulatory Visit (INDEPENDENT_AMBULATORY_CARE_PROVIDER_SITE_OTHER): Admitting: Medical

## 2023-05-07 ENCOUNTER — Encounter (HOSPITAL_COMMUNITY): Payer: Self-pay | Admitting: Medical

## 2023-05-07 DIAGNOSIS — Z8679 Personal history of other diseases of the circulatory system: Secondary | ICD-10-CM

## 2023-05-07 DIAGNOSIS — Z8759 Personal history of other complications of pregnancy, childbirth and the puerperium: Secondary | ICD-10-CM

## 2023-05-07 DIAGNOSIS — O09299 Supervision of pregnancy with other poor reproductive or obstetric history, unspecified trimester: Secondary | ICD-10-CM

## 2023-05-07 DIAGNOSIS — F1120 Opioid dependence, uncomplicated: Secondary | ICD-10-CM

## 2023-05-07 DIAGNOSIS — F17290 Nicotine dependence, other tobacco product, uncomplicated: Secondary | ICD-10-CM

## 2023-05-07 DIAGNOSIS — I38 Endocarditis, valve unspecified: Secondary | ICD-10-CM

## 2023-05-07 DIAGNOSIS — F1910 Other psychoactive substance abuse, uncomplicated: Secondary | ICD-10-CM

## 2023-05-07 DIAGNOSIS — T7411XS Adult physical abuse, confirmed, sequela: Secondary | ICD-10-CM

## 2023-05-07 DIAGNOSIS — Z8659 Personal history of other mental and behavioral disorders: Secondary | ICD-10-CM

## 2023-05-07 DIAGNOSIS — F1520 Other stimulant dependence, uncomplicated: Secondary | ICD-10-CM

## 2023-05-07 DIAGNOSIS — F4310 Post-traumatic stress disorder, unspecified: Secondary | ICD-10-CM

## 2023-05-07 MED ORDER — BUPROPION HCL ER (SR) 100 MG PO TB12: 100.0000 mg | ORAL_TABLET | Freq: Two times a day (BID) | ORAL | 2 refills | Status: DC

## 2023-05-07 MED ORDER — BUPRENORPHINE HCL-NALOXONE HCL 8-2 MG SL SUBL: 1.0000 | SUBLINGUAL_TABLET | Freq: Three times a day (TID) | SUBLINGUAL | 0 refills | Status: AC
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
## 2023-05-07 NOTE — Progress Notes (Signed)
 Daily Group Progress Note   Program: CD IOP     Group Time: 9 a.m. to 12 p.m.      Type of Therapy: Process and Psychoeducational    Topic: The therapists check in with group members, assess for SI/HI/psychosis and overall level of functioning. The therapists inquire about sobriety date and number of community support meetings attended since last session.    The therapists explains the purpose of having a Sponsor and shows group members NA Step worksheets so as to have a better understanding of what working steps involves. The therapists continues presenting information on relapse prevention and the five rules of recovery focusing on redefining fun, learning from setbacks, being comfortable with being uncomfortable, and rule number one of the five rules of recovery; change your life.   Summary: Phyllis Goodman presents rating her depression as a 4 and her anxiety as a 7. She describes her mood as "hopeful" and "happy." She says that she has about a month of sobriety.   She did not show for her family session on Friday or IOP as she was depressed; however, she does reschedule the family meeting for the Monday after next as her kids are on spring break this week. She blocked her kids father "for good" but talks about how she wants him to be part of the kids' lives while adding that he puts guilt trips on her. Phyllis Goodman says that she feels guilty which causes her to feel depressed. The therapists address the irrational thinking underlying her irrational guilt.   Phyllis Goodman says that she is doing a variety of virtual meetings but is trying to get the "courage" up to attend an in person meeting but says that when she does go in person that her mom intends on going with her. She says that she recognizes the need for and wants to have a Marketing executive.    Progress Towards Goals: Phyllis Goodman reports no change in her sobriety.   UDS collected: Yes Results: No   AA/NA attended?:  Yes  Sponsor?:  No   Will Hare, MA, [place],  [facility], LCAS Earnie Gola, [place], LCAS 05/07/2023

## 2023-05-09 ENCOUNTER — Telehealth (HOSPITAL_COMMUNITY): Payer: Self-pay

## 2023-05-09 ENCOUNTER — Ambulatory Visit (HOSPITAL_COMMUNITY)

## 2023-05-09 NOTE — Telephone Encounter (Signed)
 Phyllis Goodman calls and leaves a voice mail saying that her son got sick during the night and she is going to have to take him to the doctor so she will not be in [place] today. She leaves her number if therapist wants to call back.  Earnie Gola, MS, LMFT, LCAS

## 2023-05-11 ENCOUNTER — Telehealth (HOSPITAL_COMMUNITY): Payer: Self-pay | Admitting: Licensed Clinical Social Worker

## 2023-05-11 ENCOUNTER — Ambulatory Visit (INDEPENDENT_AMBULATORY_CARE_PROVIDER_SITE_OTHER)

## 2023-05-11 DIAGNOSIS — F17290 Nicotine dependence, other tobacco product, uncomplicated: Secondary | ICD-10-CM

## 2023-05-11 DIAGNOSIS — F1120 Opioid dependence, uncomplicated: Secondary | ICD-10-CM

## 2023-05-11 DIAGNOSIS — F1520 Other stimulant dependence, uncomplicated: Secondary | ICD-10-CM | POA: Diagnosis not present

## 2023-05-11 DIAGNOSIS — F4310 Post-traumatic stress disorder, unspecified: Secondary | ICD-10-CM

## 2023-05-11 NOTE — Telephone Encounter (Signed)
 The therapist calls Deziya who says, "I'm coming it" so is on her way to group.  Melynda Stagger, MA, LCSW, [facility], LCAS 05/11/2023

## 2023-05-11 NOTE — Progress Notes (Addendum)
 Daily Group Progress Note   Program: CD IOP     Group Time: 10:30 am to 12:00 pm     Type of Therapy: Process and Psychoeducational    Topic: The therapists check in with group members, assess for SI/HI/psychosis and overall level of functioning. The therapists inquire about sobriety date and number of community support meetings attended since last session.      The therapist introduces Interpersonal Effectiveness module from DBT by Jolayne Natter. This module focuses on developing skills to navigate relationships, assert needs, ad maintain self-respect while also respecting others. Therapist discusses the Handout I whichis about situations for interpersonal effectiveness, including attending to relationships, balancing priorities vs demands, balancing the wants-to should's, building mastery and self-respect" Handout II which is about goals for interpersonal effectiveness including objectives effectiveness, relationship effectiveness and self-respect: Handout 3 which is about factors reducing interpersonal effectiveness, including lack of skill, worry thoughts and began discussing emotions.  Therapist discusses how learning skills to improve interpersonal relationships relates to the recovery process.   Summary: Herminia Lope presents half way into the IOP group time. Aeriana rates her depression and anxiety as a "5". She says she has attended [employer] and continues not to have a sponsor.  Carolynne Citron is quite during group. Therapist directly asks her to describe a situation that involved the emotion of anger and describe how she dealt with it. She responds saying she rarely gets angry but on the occasion she does, she pushes it down and does not deal with it.      Progress Towards Goals: Sai reports no change in her sobriety date.    UDS collected: No  Results: None   AA/NA attended?:  Yes  Sponsor?:  No   Will Hare, MA, [place], [facility], LCAS Earnie Gola, [place], LCAS 05/11/2023

## 2023-05-14 ENCOUNTER — Ambulatory Visit (INDEPENDENT_AMBULATORY_CARE_PROVIDER_SITE_OTHER): Admitting: Licensed Clinical Social Worker

## 2023-05-14 DIAGNOSIS — F4310 Post-traumatic stress disorder, unspecified: Secondary | ICD-10-CM

## 2023-05-14 DIAGNOSIS — F1520 Other stimulant dependence, uncomplicated: Secondary | ICD-10-CM

## 2023-05-14 DIAGNOSIS — F1120 Opioid dependence, uncomplicated: Secondary | ICD-10-CM

## 2023-05-14 DIAGNOSIS — F17290 Nicotine dependence, other tobacco product, uncomplicated: Secondary | ICD-10-CM
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
## 2023-05-15 DIAGNOSIS — F341 Dysthymic disorder: Secondary | ICD-10-CM | POA: Diagnosis not present

## 2023-05-16 ENCOUNTER — Ambulatory Visit (INDEPENDENT_AMBULATORY_CARE_PROVIDER_SITE_OTHER)

## 2023-05-16 DIAGNOSIS — F1520 Other stimulant dependence, uncomplicated: Secondary | ICD-10-CM

## 2023-05-16 DIAGNOSIS — F1120 Opioid dependence, uncomplicated: Secondary | ICD-10-CM

## 2023-05-16 NOTE — Progress Notes (Addendum)
 Daily Group Progress Note   Program: CD IOP     Group Time: 9:46 a.m. to 12 p.m.      Type of Therapy: Process and Psychoeducational    Topic: The therapists check in with group members, assess for SI/HI/psychosis and overall level of functioning. The therapists inquire about sobriety date and number of community support meetings attended since last session.   Therapists show the video "The Moth" which shows a man creating alternative realities in his mind. The video communicates that no matter of which choices you make in life, that reality can create another set of problems, despite how "wonderful" you try to create the alternative reality in your mind. In response to a group member inquiring about how long depression lasts in recovery, therapist present material and discuss what Post Acute Withdrawal Syndrome (PAWS) detailing the various withdrawal symptoms that occur after the acute phase of withdrawal.  Therapist discuss the norm is for PAWS about 1 year. Therapists discuss the stages of recovery including the following: Abstinence stage, recovery stage, repair stage and growth stage. Therapists discuss the causes of relapse in the late stage of recovery.    Summary: Phyllis Goodman presents late today and rates her depression and anxiety as a "7".  Phyllis Goodman reports the same sobriety date. She reports she has not attended any meetings since the last IOP session. She does not have a sponsor. Phyllis Goodman identifies her emotions as "frustrated and hopeful". Therapist asks her how the family session went for she and her family on Monday.  Phyllis Goodman says her parents did not really talk on the way home, but did take her phone away. She says she realizes they think they are trying to protect her by taking her phone so she will not call the father of her children.  Phyllis Goodman says because she does not have a phone she cannot access virtual sessions. Therapist offers to have a family session so it can be explained that taking  Phyllis Goodman's phone may not have been the best choice, as they are taking responsibility for her recovery away form her. Phyllis Goodman shakes her head indicating she understands the offer.  Progress Towards Goals: Phyllis Goodman reports no change in her sobriety.   UDS collected: No Results: None   AA/NA attended?:  No  Sponsor?:  No   Will Hare, MA, [place], [facility], LCAS Earnie Gola, [place], LCAS 05/16/2023

## 2023-05-17 ENCOUNTER — Encounter: Payer: Self-pay | Admitting: Medical

## 2023-05-18 ENCOUNTER — Ambulatory Visit (INDEPENDENT_AMBULATORY_CARE_PROVIDER_SITE_OTHER): Admitting: Licensed Clinical Social Worker

## 2023-05-18 DIAGNOSIS — F1520 Other stimulant dependence, uncomplicated: Secondary | ICD-10-CM

## 2023-05-18 DIAGNOSIS — F17290 Nicotine dependence, other tobacco product, uncomplicated: Secondary | ICD-10-CM

## 2023-05-18 DIAGNOSIS — F1120 Opioid dependence, uncomplicated: Secondary | ICD-10-CM

## 2023-05-18 DIAGNOSIS — F4310 Post-traumatic stress disorder, unspecified: Secondary | ICD-10-CM
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
## 2023-05-21 ENCOUNTER — Ambulatory Visit (HOSPITAL_COMMUNITY)

## 2023-05-21 ENCOUNTER — Telehealth (HOSPITAL_COMMUNITY): Payer: Self-pay | Admitting: Licensed Clinical Social Worker

## 2023-05-21 NOTE — Telephone Encounter (Signed)
 The therapist attempts to reach Phyllis Goodman leaving a HIPAA-compliant voicemail.  Melynda Stagger, MA, LCSW, [facility], LCAS 05/21/2023

## 2023-05-23 ENCOUNTER — Ambulatory Visit (HOSPITAL_COMMUNITY)

## 2023-05-23 ENCOUNTER — Encounter: Payer: Medicaid Other | Admitting: Family Medicine

## 2023-05-23 ENCOUNTER — Telehealth (HOSPITAL_COMMUNITY): Payer: Self-pay

## 2023-05-23 NOTE — Telephone Encounter (Signed)
 Phyllis Goodman called to say that she has the flu is going around her house and that she has it, so she was letting us  know why she missed CDIOP today.  Earnie Gola, MS, LMFT, LCAS

## 2023-05-25 ENCOUNTER — Telehealth (HOSPITAL_COMMUNITY): Payer: Self-pay | Admitting: Licensed Clinical Social Worker

## 2023-05-25 ENCOUNTER — Ambulatory Visit (HOSPITAL_COMMUNITY)

## 2023-05-25 NOTE — Telephone Encounter (Signed)
 Arelie calls saying that she was unable to make it today but will be in group on Monday.  Melynda Stagger, MA, LCSW, [facility], LCAS 05/25/2023

## 2023-05-28 ENCOUNTER — Ambulatory Visit (HOSPITAL_COMMUNITY)

## 2023-05-28 ENCOUNTER — Telehealth (HOSPITAL_COMMUNITY): Payer: Self-pay | Admitting: Licensed Clinical Social Worker

## 2023-05-28 NOTE — Telephone Encounter (Signed)
 The therapist attempts to reach Phyllis Goodman leaving a HIPAA-compliant voicemail.  Melynda Stagger, MA, LCSW, [facility], LCAS 05/28/2023

## 2023-05-30 ENCOUNTER — Ambulatory Visit (INDEPENDENT_AMBULATORY_CARE_PROVIDER_SITE_OTHER)

## 2023-05-30 DIAGNOSIS — F1520 Other stimulant dependence, uncomplicated: Secondary | ICD-10-CM

## 2023-05-30 DIAGNOSIS — F1120 Opioid dependence, uncomplicated: Secondary | ICD-10-CM

## 2023-05-30 DIAGNOSIS — F4310 Post-traumatic stress disorder, unspecified: Secondary | ICD-10-CM

## 2023-05-30 DIAGNOSIS — F17290 Nicotine dependence, other tobacco product, uncomplicated: Secondary | ICD-10-CM

## 2023-05-30 NOTE — Progress Notes (Addendum)
 Daily Group Progress Note   Program: CD IOP     Group Time: 9 a.m. to 12 p.m.      Type of Therapy: Process and Psychoeducational    Topic: The therapist checks in with group members, assesses for SI/HI/psychosis and overall level of functioning. The therapist inquires about sobriety date and number of community support meetings attended since last session.    The therapists focus on dealing with sexuality when sober, building sober supports, and overcoming toxic shame and guilt. The therapists observe that many group members were brave today in opening up and have take some big steps in their recovery.   Summary: Phyllis Goodman presents rating her depression as a "4" and her anxiety as a "4". She reports she is better but everyone in her house has had the flu.  Phyllis Goodman reports the same sobriety date. She says she has been attending virtual meetings and will try to go to an in person meeting with her peers on Friday  She identifies her emotions as "Happy and Hopeful".  Phyllis Goodman's take away from today is that she needs to go to an in person meeting. Progress Towards Goals: denies any use of drugs or alcohol    UDS collected: No Results: none  AA/NA attended?:  No  Sponsor?:  No   Earnie Gola, MS, LMFT, [place], [place], [place], [facility], [place] 05/072025

## 2023-06-01 ENCOUNTER — Ambulatory Visit (HOSPITAL_COMMUNITY): Admitting: Medical

## 2023-06-01 ENCOUNTER — Encounter (HOSPITAL_COMMUNITY): Payer: Self-pay | Admitting: Medical

## 2023-06-01 ENCOUNTER — Telehealth (HOSPITAL_COMMUNITY): Payer: Self-pay | Admitting: Licensed Clinical Social Worker

## 2023-06-01 DIAGNOSIS — Z8659 Personal history of other mental and behavioral disorders: Secondary | ICD-10-CM

## 2023-06-01 DIAGNOSIS — F1120 Opioid dependence, uncomplicated: Secondary | ICD-10-CM

## 2023-06-01 DIAGNOSIS — Z8679 Personal history of other diseases of the circulatory system: Secondary | ICD-10-CM

## 2023-06-01 DIAGNOSIS — F1910 Other psychoactive substance abuse, uncomplicated: Secondary | ICD-10-CM

## 2023-06-01 DIAGNOSIS — F4310 Post-traumatic stress disorder, unspecified: Secondary | ICD-10-CM

## 2023-06-01 DIAGNOSIS — F17290 Nicotine dependence, other tobacco product, uncomplicated: Secondary | ICD-10-CM

## 2023-06-01 DIAGNOSIS — F1520 Other stimulant dependence, uncomplicated: Secondary | ICD-10-CM

## 2023-06-01 DIAGNOSIS — T7411XS Adult physical abuse, confirmed, sequela: Secondary | ICD-10-CM

## 2023-06-01 DIAGNOSIS — I38 Endocarditis, valve unspecified: Secondary | ICD-10-CM

## 2023-06-01 DIAGNOSIS — O09299 Supervision of pregnancy with other poor reproductive or obstetric history, unspecified trimester: Secondary | ICD-10-CM

## 2023-06-01 MED ORDER — BUPROPION HCL ER (SR) 100 MG PO TB12: 100.0000 mg | ORAL_TABLET | Freq: Two times a day (BID) | ORAL | 2 refills | Status: DC

## 2023-06-01 NOTE — Telephone Encounter (Signed)
 Campbell's mother calls saying that Phyllis Goodman is supposed to be going to the [facility] today with IOP but has not turned on her location tracker. Her mom wants to verify that she has the correct address for the [facility]. The therapist returns her call giving her the address of the [employer] while providing no additional information.   Melynda Stagger, MA, LCSW, [facility], LCAS 06/01/2023

## 2023-06-01 NOTE — Progress Notes (Signed)
Refill request done. 

## 2023-06-04 ENCOUNTER — Ambulatory Visit (INDEPENDENT_AMBULATORY_CARE_PROVIDER_SITE_OTHER)

## 2023-06-04 ENCOUNTER — Other Ambulatory Visit (HOSPITAL_COMMUNITY): Payer: Self-pay | Admitting: Medical

## 2023-06-04 DIAGNOSIS — Z419 Encounter for procedure for purposes other than remedying health state, unspecified: Secondary | ICD-10-CM | POA: Diagnosis not present

## 2023-06-04 DIAGNOSIS — F1120 Opioid dependence, uncomplicated: Secondary | ICD-10-CM

## 2023-06-04 DIAGNOSIS — F1520 Other stimulant dependence, uncomplicated: Secondary | ICD-10-CM

## 2023-06-04 DIAGNOSIS — F4310 Post-traumatic stress disorder, unspecified: Secondary | ICD-10-CM

## 2023-06-04 MED ORDER — BUPRENORPHINE HCL-NALOXONE HCL 8-2 MG SL SUBL: 1.0000 | SUBLINGUAL_TABLET | Freq: Three times a day (TID) | SUBLINGUAL | 0 refills | Status: AC

## 2023-06-04 NOTE — Progress Notes (Signed)
 Daily Group Progress Note   Program: CD IOP     Group Time: 9 a.m. to 12 p.m.      Type of Therapy: Process and Psychoeducational    Topic: The therapist checks in with group members, assesses for SI/HI/psychosis and overall level of functioning. The therapist inquires about sobriety date and number of community support meetings attended since last session.     Therapist discusses the following issues and prompts discussion with the group: Blackouts from substance use, role of honesty, forgiving self, presented internal trigger questionnaire from the [facility], as group members to complete it and discussed tole of internal triggers in relapse.  Summary: Phyllis Goodman presents rating her depression as a "7" and her anxiety as a "4". Phyllis Goodman says she went to her first meeting with a  peer from IOP on last Friday. She says she was not sure what to expect but she found herself tearing up when a female AA member spoke. She says she was surprised she was emotionally touched by a female.  Phyllis Goodman says she plans to go back to another meeting. She says she and a peer were supposed to go to a women's meeting on last Saturday but the peer was not able to go. She notes she would not have ever gone on her own.  Therapist inquires what she thought of the meeting and she responds it was "ok". Phyllis Goodman does not have a sponsor. She says she did get some of the women's phone numbers. She identifies her emotions as "happy and hopeful"  Phyllis Goodman says she has "ended" communication with her ex.  She states she has not blocked him, but does not want to talk to him as he has nothing constructive to say to her.  Phyllis Goodman says if she were to continue to allow him to be in her life she would go back to using heroin as she thinks she could more easily overdose on heroin rather than meth.  Therapist inquires if she is suicidal. Phyllis Goodman says "no" but says if she were to have him in her life, she would not want to live that way.  Phyllis Goodman  identifies her primary internal triggers as "depressed and overwhelmed".   Progress Towards Goals: denies any use of drugs or alcohol    UDS collected: Yes Results: none  AA/NA attended?:  No  Sponsor?:  No   Phyllis Gola, MS, LMFT, LCAS,   06/04/2023

## 2023-06-04 NOTE — Progress Notes (Signed)
 Patient ID: Phyllis Goodman, female   DOB: [birth year], 28 y.o.   MRN: [id number] Patient is having trouble since hospitalization with her Buprenorphin dosageShe was on 5.7 mg ZUBSOLV  which is equivalent to [facility] started 2mg  Suboxone  but this dose did not control her cravingsne When she returned to [facility] MATfor some reason she was continued on 2mg  dosage despite our covering her for proper dosage until she could return?:  PDMP 05/16/2023 05/15/2023  1 Buprenorphine -Nalox 2-0.5mg  Tb 60.00 30 Je Edw [id number] Nor ([id number]) 0/0 4.00 mg Medicaid [place]  05/07/2023 05/07/2023  1 Buprenorphine -Nalox 8-2 Mg Tab 24.00 8 Ch Kob [id number] Nor ([id number]) 0/0 24.00 mg Medicaid [place]  04/20/2023 04/20/2023  1 Buprenorphine -Nalox 8-2 Mg Tab 33.00 11 Ch Kob [id number] Nor ([id number]) 0/0 24.00 mg Medicaid [place]  04/12/2023 04/11/2023  1 Buprenorphine -Nalox 2-0.5mg  Fm 32.00 16 Je Edw [id number] Nor ([id number]) 0/0 4.00 mg Medicaid [place]  04/10/2023 04/09/2023  1 Buprenorphine -Nalox 2-0.5mg  Tb 4.00 2 Mo Con [id number] Nor ([id number]) 0/0 4.00 mg Private Pay [place]  01/30/2023 01/30/2023  1 Zubsolv  5.7-1.4 Mg Tablet Sl 90.00

## 2023-06-06 ENCOUNTER — Ambulatory Visit (INDEPENDENT_AMBULATORY_CARE_PROVIDER_SITE_OTHER): Admitting: Licensed Clinical Social Worker

## 2023-06-06 DIAGNOSIS — F1520 Other stimulant dependence, uncomplicated: Secondary | ICD-10-CM

## 2023-06-06 DIAGNOSIS — F1120 Opioid dependence, uncomplicated: Secondary | ICD-10-CM | POA: Diagnosis not present

## 2023-06-06 DIAGNOSIS — F4310 Post-traumatic stress disorder, unspecified: Secondary | ICD-10-CM
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
## 2023-06-08 ENCOUNTER — Ambulatory Visit (INDEPENDENT_AMBULATORY_CARE_PROVIDER_SITE_OTHER)

## 2023-06-08 DIAGNOSIS — F1520 Other stimulant dependence, uncomplicated: Secondary | ICD-10-CM

## 2023-06-08 DIAGNOSIS — F4310 Post-traumatic stress disorder, unspecified: Secondary | ICD-10-CM

## 2023-06-08 DIAGNOSIS — F1120 Opioid dependence, uncomplicated: Secondary | ICD-10-CM | POA: Diagnosis not present

## 2023-06-08 NOTE — Progress Notes (Addendum)
 Daily Group Progress Note   Program: CD IOP     Group Time:  9:55 a.m. to 12 p.m.      Type of Therapy: Process and Psychoeducational    Topic: The therapist checks in with group members, assesses for SI/HI/psychosis and overall level of functioning. The therapist inquires about sobriety date and number of community support meetings attended since last session.    Therapists explore emotions and coping skills for group member that verbalizes having a fleeting suicidal thought. Therapists distribute a CBT work sheet on "Automatic Thoughts: Techniques for Disputing Irrational Beliefs".  Therapists elaborate on how to challenge irrational thoughts, coming to the realization that mistakes are opportunities for learning and "balance the equation".  Summary: Phyllis Goodman presents late to group. She rates her depression as a "5" and her anxiety as a "5".  She reports the same sobriety date, though she admits to drinking a beer on most days to relax.  She does not have a sponsor but says she has spoken to women at the [place] meeting and plans to ask them if they can recommend a sponsor.  Phyllis Goodman identifies her emotions as "hopeful and feeling good". Therapists discuss with her that this program is abstinence based so she has a new sobriety date. Phyllis Goodman says she drank last week sometime. So her new sobriety date is the week of May 5-12, 2025.  Therapists ask Phyllis Goodman if her parents realize she is drinking. Phyllis Goodman says they have no problem with it, since her father drinks.  Phyllis Goodman says she is frustrated because her parents stay on her on the time. She says if she does not stay right with her mother at all times, her father becomes angry. Phyllis Goodman says her boys scream and she has found herself raising her voice at them.  She says she would drink a beer to calm down.  Phyllis Goodman asks what is the point of staying sober, if her parents are not going to trust her.  Therapists point out that Phyllis Goodman is in essence saying that her  sobriety is dependent on what someone else does. Therapists ask how long she has been since the last use and she reports it was about a month ago. Therapists discuss how this may not be a long enough period to rebuilt lost trust. Therapist encourage Phyllis Goodman to ask her parents to come in again for a therapy session.  Therapists discuss the possibility of moving into an [address], however Phyllis Goodman says she is not willing to be away from her children, as she has been away long enough when she was in active use.       Progress Towards Goals: week of May 5-12. Reports the last use of beer was during this week.   UDS collected: no  Results: negative  AA/NA attended?:  yes  Sponsor?:  No   Earnie Gola, MS, LMFT, [address] [place], [place], LCSW, [facility], LCAS  06/08/2023

## 2023-06-11 ENCOUNTER — Ambulatory Visit (HOSPITAL_COMMUNITY)

## 2023-06-11 ENCOUNTER — Telehealth (HOSPITAL_COMMUNITY): Payer: Self-pay

## 2023-06-11 NOTE — Telephone Encounter (Signed)
 Nathifa misses today in CDIOP group. Therapist calls her to check in with her.  Therapist reaches voice mail and leaves a  HIPAA compliant message requesting a return call.  Earnie Gola, MS, LMFT, LCAS

## 2023-06-12 ENCOUNTER — Telehealth (HOSPITAL_COMMUNITY): Payer: Self-pay

## 2023-06-12 NOTE — Telephone Encounter (Signed)
 Jhade's mother returns the call this therapist made to [place] as they have the same number.  She leaves a message stating that Lille would not get up on 06-11-23 to come to group.  Mother says that Carolynne Citron went to a meeeting with a peer on Friday night and said she would be home by 7,  and Jesenia would call saying she was on her way home, but did not come in until 10am.  Mother says that Billiejo was acting strange on Saturday after she got up.  Mother says she hopes Julissa will be honest about what she did on Friday Night.  Earnie Gola, MS, LMFT, LCAS

## 2023-06-13 ENCOUNTER — Ambulatory Visit (INDEPENDENT_AMBULATORY_CARE_PROVIDER_SITE_OTHER): Admitting: Licensed Clinical Social Worker

## 2023-06-13 DIAGNOSIS — F1520 Other stimulant dependence, uncomplicated: Secondary | ICD-10-CM

## 2023-06-13 DIAGNOSIS — F1120 Opioid dependence, uncomplicated: Secondary | ICD-10-CM | POA: Diagnosis not present

## 2023-06-13 DIAGNOSIS — F4310 Post-traumatic stress disorder, unspecified: Secondary | ICD-10-CM

## 2023-06-13 NOTE — Progress Notes (Signed)
 Daily Group Progress Note   Program: CD IOP     Group Time: 9 a.m. to 12 p.m.      Type of Therapy: Process and Psychoeducational    Topic: The therapists check in with group members, assess for SI/HI/psychosis and overall level of functioning. The therapists inquire about sobriety date and number of community support meetings attended since last session.  The therapists go over the diagnostic criteria for substance use.  The therapist explain that many people have the misconception that a person cannot have a substance use disorder if he or she is still functional in one or more areas of his or her life.  Additionally, people have the misconception that a person cannot have a substance use disorder if he or she does not experience withdrawal from the substance upon discontinuing the substance.  The therapists explain the reason that addiction is a disease and not a character defect and why relying on willpower alone is a strategy that is doomed to fail.  The therapists emphasize the importance of avoiding triggers and explained that recovery will require that they make sacrifices in life changes that will be difficult to do. The therapists strongly encourage group members to get Sponsors who can check in on them daily especially given that they are in very early recovery and thus at high risk of relapse.    Summary: Phyllis Goodman presents today rating both her depression and anxiety as being a 5.  She says that she has the same sobriety date and that she has not attended a meeting since last Friday.  She says that she needs to work to find a sponsor and suggest that she might want to try attending a Narcotics Anonymous meeting; however, she expresses fears about doing so.  Additionally, she does not have a clear answer as to how she would get transportation to a Narcotics Anonymous meeting if she were to attend.  She describes her mood as "good right now" and "hopeful things will change."  When the  therapists inquire as to the reason that Phyllis Goodman did not attend group on Monday, she says that she was having a not good day without providing any additional specifics.  The therapists remind her once again that if she does not attend group that they would appreciate if she were to call and leave a voicemail to this effect on one of the numbers posted on the group room whiteboard.  The therapists also address her previous statements questioning what the point of sobriety is if her parents are to continue not trusting her.  When pressed, she says that her reason for getting sober is for herself and her children.  She has not talked to her father about coming in for a session saying that she is avoiding doing so so as to not get into an argument.  She does eventually conclude that she needs to talk to her parents continuing to complain that they are not giving her enough space.  When asked what this space would look like if they were to give it to her, she is unable to give any specifics.  The therapists ask Phyllis Goodman to consider what it is that she has to lose and going to a Narcotics Anonymous meeting or other meetings.  They explained to her that in order for her to recover that she is going to have to do some things that she initially finds scary or anxiety provoking to her.   Progress Towards Goals: Phyllis Goodman reports no  change in her sobriety date.  UDS collected: Yes results: Yes, positive for methamphetamine  AA/NA attended?:  No  Sponsor?:  No Will Hare, MA, [place], [facility], LCAS Earnie Gola, [place], LCAS 06/13/2023

## 2023-06-15 ENCOUNTER — Ambulatory Visit (INDEPENDENT_AMBULATORY_CARE_PROVIDER_SITE_OTHER)

## 2023-06-15 DIAGNOSIS — F1120 Opioid dependence, uncomplicated: Secondary | ICD-10-CM

## 2023-06-15 DIAGNOSIS — F4310 Post-traumatic stress disorder, unspecified: Secondary | ICD-10-CM

## 2023-06-15 DIAGNOSIS — F1520 Other stimulant dependence, uncomplicated: Secondary | ICD-10-CM

## 2023-06-15 NOTE — Progress Notes (Signed)
 Daily Group Progress Note   Program: CD IOP     Group Time:  10:45a.m. to 12 p.m.      Type of Therapy: Process and Psychoeducational    Topic: The therapist checks in with group members, assesses for SI/HI/psychosis and overall level of functioning. The therapist inquires about sobriety date and number of community support meetings attended since last session.     The therapists proceed with showing the next part of the video, Breaking the Addiction Cycle by Maple Seltzer. Issues that were discussed including the following:  Impaired thinking which she referred to sincere delusions. Such delusions keep one from seeing reality; denial which includes rationalizing, minimizing, blaming; consequences including physical, emotional and spiritual.  Therapist prompt discussion on these issues. Therapist discusses and prompts discussion on how addicts convince themselves they are not an addict because of certain criteria.  Therapist asks group members to give examples. Therapist discuss shame being a part of the addiction cycle and asks if anyone had planned to become an addict as a way of seeing it is a brain disease.  Summary: Phyllis Goodman presents late to group.  She reports the same sobriety date. She says she has not been to a meeting since a week ago.  Phyllis Goodman says she wants to go to a NA meeting but will have to discuss with her parents about transportation.  Phyllis Goodman shares she talked to her father last evening and he agreed not to bring up her past again. Therapists discuss how he will probably bring her past up again and she will need to remind him of what he agreed to. Phyllis Goodman says her father bottles things up so she guesses this is where she gets her keeping things to herself from.  She says her father wants her to make friends at church rather than in meetings.   Progress Towards Goals: week of May 5-12. Reports the last use of beer was during this week.   UDS collected: no  Results: no  AA/NA  attended?:  yes  Sponsor?:  No   Earnie Gola, MS, LMFT, [address] [place], [place], LCSW, [facility], LCAS  06/15/2023  Individual: Therapist meets with Phyllis Goodman for 15 minutes as the CMA had alerted this therapist that Phyllis Goodman was having difficulty walking and had gone back to the restroom and would not answer when she knocked. This therapist knocked on the door and told Phyllis Goodman who is was and asked if she was ok.  She indicated she was.  Therapist met with Phyllis Goodman and she again insisted she was fine. Therapist asked Phyllis Goodman where she felt she was in the treatment progress and she said she did not know. Therapist asked Phyllis Goodman what she had learned thus far in group and she said "a lot".  Therapist inquire if her family life was any better and she said she had a discussion with her father and therapist inquired about content. She said they just talked about stuff.  Phyllis Goodman was agitation during this meeting. She did say she spoke briefly with her father and asked him to never mention her past again.  Therapist asked Phyllis Goodman what her opinion about how long she thought she may need to stay in IOP and she said past June, 2025.

## 2023-06-20 ENCOUNTER — Ambulatory Visit (INDEPENDENT_AMBULATORY_CARE_PROVIDER_SITE_OTHER): Admitting: Licensed Clinical Social Worker

## 2023-06-20 DIAGNOSIS — F1520 Other stimulant dependence, uncomplicated: Secondary | ICD-10-CM | POA: Diagnosis not present

## 2023-06-20 DIAGNOSIS — F1120 Opioid dependence, uncomplicated: Secondary | ICD-10-CM

## 2023-06-20 DIAGNOSIS — F17290 Nicotine dependence, other tobacco product, uncomplicated: Secondary | ICD-10-CM

## 2023-06-20 DIAGNOSIS — F4310 Post-traumatic stress disorder, unspecified: Secondary | ICD-10-CM

## 2023-06-20 NOTE — Progress Notes (Signed)
 Daily Group Progress Note   Program: CD IOP     Group Time: 10 a.m. to 12 p.m.      Type of Therapy: Process and Psychoeducational    Topic: The therapists check in with group members, assess for SI/HI/psychosis and overall level of functioning. The therapists inquire about sobriety date and number of community support meetings attended since last session.  The therapists continue showing the video, "Breaking the Addiction Cycle" focusing on the individual using cycle explaining that it is not possible to stop the thoughts or "preoccupation" and that the focus should be on understanding and avoiding entering into one's using ritual. Group members are given the homework to think about their using rituals and be able to describe them when they next return to group. The therapists continue to emphasize the importance of family therapy as it relates to addiction recovery.    Summary: Zayra presents late for group as result of waking up late and then having to get her son ready for school.  She rates her depression and anxiety both as a 4.  She says that she has the same sobriety date and describes her mood as "good" and "hopeful."  She says that she tried a virtual Narcotics Anonymous meeting but was unable to get the feel for it so was interested in attending 1 in person.  She says that she had a rough Monday so ended up calling one of the other group members for support.  Tran says that she was having issues with her father who bothered her about wanting to stay in her room and threatened to have her take a urine drug screen.  Zi admits that she was staying in her room to get away from her father as he is not pleasant to be around when drinking.  She says that when he is not drinking alcohol  that she does enjoy being with him.  When asked if she has shared this information with her father, she says that she has not as she does not want to hurt his feelings.  Additionally, there are issues  concerning her children not knowing who to listen to.  She says that she would like for her mother to step more into the background and allow her to parent her children.  The therapists once again encouraged her to invite her family to come in for a family session to discuss these issues with Keshara saying that she will likely pursue this option.  Progress Towards Goals: Veera reports no change in her sobriety date.  UDS collected: Yes Results: Yes, negative for drugs and alcohol   AA/NA attended?:  Yes  Sponsor?:  No  Will Hare, MA, [place], [facility], LCAS Earnie Gola, [place], LCAS 06/20/2023

## 2023-06-22 ENCOUNTER — Ambulatory Visit (INDEPENDENT_AMBULATORY_CARE_PROVIDER_SITE_OTHER)

## 2023-06-22 DIAGNOSIS — F1120 Opioid dependence, uncomplicated: Secondary | ICD-10-CM

## 2023-06-22 DIAGNOSIS — F1520 Other stimulant dependence, uncomplicated: Secondary | ICD-10-CM | POA: Diagnosis not present

## 2023-06-22 DIAGNOSIS — F4310 Post-traumatic stress disorder, unspecified: Secondary | ICD-10-CM

## 2023-06-22 NOTE — Progress Notes (Addendum)
 Daily Group Progress Note   Program: CD IOP     Group Time:  10:00 a.m. to 12 p.m.      Type of Therapy: Process and Psychoeducational    Topic: The therapist checks in with group members, assesses for SI/HI/psychosis and overall level of functioning. The therapist inquires about sobriety date and number of community support meetings attended since last session.     Therapist introduces new group member and asks members to share their story with her to foster interaction.  Therapist shows the last few minutes of the video Breaking the Addiction Cycle by Maple Seltzer, focusing on ritual and despair phases and asks group members to share their rituals in using.Therapist discusses the importance of assertiveness in recovery.  Summary: Phyllis Goodman presents late to group. She rates her depression as a "5" and her anxiety as a "7".  Phyllis Goodman says she has the same sobriety date.  She is attending meetings but has no sponsor.,  She describes her emotions as "good and hopeful". Phyllis Goodman shares her story of with the new group member . Phyllis Goodman says she plans to talk to her father this weekend about coming in for family therapist as he usually drinks less on the weekend and is more approachable.  Phyllis Goodman says she cannot think of anything that would be in the ritual stage for her. Therapist encourages her to listen to others as they share information and perhaps she will become aware that she does have a ritual is her use.  Progress Towards Goals: week of May 5-12. Reports the last use of beer was during this week.   UDS collected: no  Results: no  AA/NA attended?:  yes  Sponsor?:  No  Earnie Gola, MS, LMFT, LCAS 06-22-23

## 2023-06-25 ENCOUNTER — Ambulatory Visit (INDEPENDENT_AMBULATORY_CARE_PROVIDER_SITE_OTHER): Payer: MEDICAID | Admitting: Licensed Clinical Social Worker

## 2023-06-25 DIAGNOSIS — I38 Endocarditis, valve unspecified: Secondary | ICD-10-CM

## 2023-06-25 DIAGNOSIS — F4310 Post-traumatic stress disorder, unspecified: Secondary | ICD-10-CM

## 2023-06-25 DIAGNOSIS — Z8679 Personal history of other diseases of the circulatory system: Secondary | ICD-10-CM

## 2023-06-25 DIAGNOSIS — Z8659 Personal history of other mental and behavioral disorders: Secondary | ICD-10-CM

## 2023-06-25 DIAGNOSIS — T7411XS Adult physical abuse, confirmed, sequela: Secondary | ICD-10-CM

## 2023-06-25 DIAGNOSIS — F1910 Other psychoactive substance abuse, uncomplicated: Secondary | ICD-10-CM

## 2023-06-25 DIAGNOSIS — F17290 Nicotine dependence, other tobacco product, uncomplicated: Secondary | ICD-10-CM

## 2023-06-25 DIAGNOSIS — F1520 Other stimulant dependence, uncomplicated: Secondary | ICD-10-CM | POA: Diagnosis not present

## 2023-06-25 DIAGNOSIS — F1120 Opioid dependence, uncomplicated: Secondary | ICD-10-CM

## 2023-06-25 DIAGNOSIS — O09299 Supervision of pregnancy with other poor reproductive or obstetric history, unspecified trimester: Secondary | ICD-10-CM

## 2023-06-25 NOTE — Progress Notes (Signed)
 Daily Group Progress Note   Program: CD IOP     Group Time: 9:30 a.m. to 11 a.m.      Type of Therapy: Process and Psychoeducational    Topic: The therapists check in with group members, assess for SI/HI/psychosis and overall level of functioning. The therapists inquire about sobriety date and number of community support meetings attended since last session.  The therapists show a video by Shelby Dessert on the Psychology of Addiction illustrating the need to avoid all addiction substances while disputing the myth that there are hard and soft drugs. The therapists illustrate ways to dispute one's disease when it attempts to suggest that they can resume using and the value of having sober supports to call in time of crisis.    Summary: Phyllis Goodman presents today rating her depression as a 5 and her anxiety is a 4.  She describes her mood as being "good" and "optimistic."  She says that she is going to find another NA meeting to attend adding that she is not sure if she wants to attend AA or NA.  The therapists informed Phyllis Goodman that she could attend both meetings if she chose to do so.  Another group member with whom Phyllis Goodman has attended an AA meeting invites her to come back and attend the meeting with her once more.  The therapist observes that Phyllis Goodman has a transportation issue given that she does not have a car with several other women in group offering to pick her up and drive her to a meeting.  Phyllis Goodman says that she had an okay weekend in terms of getting along with her father.  She leaves for break; however, does not return to group after break for reasons that are unknown.   Progress Towards Goals: Phyllis Goodman reports no change in her sobriety date.  UDS collected: Yes Results: Yes, negative for drugs and alcohol   AA/NA attended?:  No  Sponsor?:  No  Will Hare, MA, [place], [facility], LCAS Earnie Gola, [place], LCAS 06/25/2023

## 2023-06-25 NOTE — Progress Notes (Signed)
 Phyllis Goodman

## 2023-06-27 ENCOUNTER — Ambulatory Visit (HOSPITAL_COMMUNITY)

## 2023-06-27 ENCOUNTER — Ambulatory Visit (INDEPENDENT_AMBULATORY_CARE_PROVIDER_SITE_OTHER): Payer: MEDICAID

## 2023-06-27 ENCOUNTER — Ambulatory Visit (INDEPENDENT_AMBULATORY_CARE_PROVIDER_SITE_OTHER): Payer: MEDICAID | Admitting: Licensed Clinical Social Worker

## 2023-06-27 ENCOUNTER — Encounter (HOSPITAL_COMMUNITY): Payer: Self-pay

## 2023-06-27 DIAGNOSIS — F17290 Nicotine dependence, other tobacco product, uncomplicated: Secondary | ICD-10-CM

## 2023-06-27 DIAGNOSIS — F1120 Opioid dependence, uncomplicated: Secondary | ICD-10-CM

## 2023-06-27 DIAGNOSIS — F4310 Post-traumatic stress disorder, unspecified: Secondary | ICD-10-CM

## 2023-06-27 DIAGNOSIS — F1520 Other stimulant dependence, uncomplicated: Secondary | ICD-10-CM

## 2023-06-27 DIAGNOSIS — Z8659 Personal history of other mental and behavioral disorders: Secondary | ICD-10-CM

## 2023-06-27 NOTE — Progress Notes (Signed)
 THERAPIST PROGRESS NOTE  Session Time: 12 p.m. to 12:55 p.m.   Type of Therapy: Individual   Therapist Response/Interventions: Solution Focused/the therapist meets with Berenize to discuss the fact that she has come to a standstill in terms of any progress that she is making in group and discusses alternate treatment options such as inpatient residential treatment and individual and/or family sessions.  The therapist also talks to her about residential programs that she could possibly get into and take her children with her such as the [facility] horizons program.  The therapist also suggested that she could possibly look at getting into an [place] near where her family lives such that she could interact with her children but have the ability to get away from her father when he is drinking.  The therapist observes that Paloma continuously talks about needing to get to meetings but never engages in the planning phase of how to make this happen.  The therapist talks to her about the importance of scheduling saying that she would not feel like she was trying to call people at the last minute to get a ride if she were to plan what meeting she wanted to attend and discussed this with them well in advance of attending.  Treatment Goals addressed:  Active     Substance Use     Reginald will abstain from using drugs per UDS while attending 12-step meetings no less than 3 times per week and obtaining a sponsor. (Initial)     Start:  06/27/23    Expected End:  12/27/23         Jizel will have resolved the issue concerning her current living situation and her father's drinking as a trigger via family therapy and/or moving to a sober living situation. (Initial)     Start:  06/27/23    Expected End:  12/27/23         The therapist will assist Shawnya in being able to identify and avoid triggers for using while assisting her in overcoming any obstacles or barriers and developing a sober support system.      Start:  06/27/23         The therapist will facilitate family sessions with Kathrynn and her parents as needed.     Start:  06/27/23            Summary: The therapist meets individually with Reanne after today's group with this meeting being attended also by Ms. Earnie Gola, LMFT, LCAS.  The major focus of today's session involves Cherith's lack of progress in terms of getting to meetings and obtaining a sponsor in addition to her continued occasional relapses.  Her living situation is also a major focus of today's session as Keonia's father's behavior becomes intrusive and annoying to the point that it causes her extreme anxiety and is a trigger for relapse.  Oriel has been unwilling to address this issue with her father not wanting to hurt his feelings and due to the fact that she believes that her parents are helping her.  She says that her parents obtained emergency custody of her children when they were born; however, she admits that she does not know the current status of custody as it relates to her children.  Another issue that is addressed is Lana's continued contact with her children's father as he was apparently the individual that she called to obtain the fentanyl  and methamphetamine that she used this past Wednesday.  She says that her parents are  not aware of the fact that she had this relapse.  By the conclusion of the session, she says that she will find out what the status is of her custody.  She says that she could plan what meetings she is going to attend and ask other women in group in advance for transportation both of which she has not done previously.  Also, she can talk to her parents about attending a session with her.  She says that she would preferably not want to move out of the house and not be there with her children; however, the therapist points out that if she were to relapse and overdose on fentanyl  as she did previously but ended up dying that she will be leaving  her children permanently.   Progress Towards Goals: Initial  Suicidal/Homicidal: No SI or HI  Plan: Nasreen will not return to IOP at this time but will begin seeing this therapist individually and will return to see him in 1 week at the [facility] office.  In the interim, she is to attend 12-step meetings and to plan which meetings she is going to attend in advance and secure transportation to them.  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 Patient/Guardian was advised if they have not already done so to contact the registration department to sign all necessary forms in order for us  to release information regarding their care.   Consent: Patient/Guardian gives verbal consent for treatment and assignment of benefits for services provided during this visit. Patient/Guardian expressed understanding and agreed to proceed.   Melynda Stagger, MA, LCSW, [facility], LCAS 06/27/2023

## 2023-06-27 NOTE — Progress Notes (Signed)
        Daily Group Progress Note   Program: CD IOP     Group Time:  10:30 a.m. to 12 p.m.      Type of Therapy: Process and Psychoeducational    Topic: The therapist checks in with group members, assesses for SI/HI/psychosis and overall level of functioning. The therapist inquires about sobriety date and number of community support meetings attended since last session.    The therapists introduce to new group members today having existing members give brief summaries of how they came to be in IOP in addition to items customarily covered in group check-in.  The therapists make group members aware of other sober support programs in addition to AA and NA such as [facility] and celebrate recovery.  The therapists solicit feedback from group members concerning their experiences thus far and attending 12-step meetings and how other people in the program have responded to any slips or relapses they have experienced so far.  The therapists make the observation that people in recovery programs are not all at the same stage of change and there are people with whom they might have negative experiences.  The therapist recommend that when people attend meetings that they "take what they need and leave the rest."  The therapists explain people can take different paths in the recovery; however, there are some universal's as it applies to recovery.  For example, the therapist notes that they are not aware of anyone who has maintained long-term abstinence from drugs or alcohol  who has continued to interact with their substance using friends or family.   Summary: Phyllis Goodman presents late to group. Phyllis Goodman reports Phyllis Goodman has a new sober date. Phyllis Goodman says Phyllis Goodman used Heroin on last Wednesday (06-20-23) . Phyllis Goodman says Phyllis Goodman called her children's father who is in active addiction. Phyllis Goodman says Phyllis Goodman knows this is the 2nd time Phyllis Goodman has used while in CDIOP.  Phyllis Goodman reports Phyllis Goodman left at break last Wednesday because Phyllis Goodman had to take her son to a MD  appointment. Phyllis Goodman says the trigger to her use was her feeling overwhelmed. Phyllis Goodman identifies her emotion as "hopeful". Therapists inquire about her plan for attending meetings and getting a sponsor. Phyllis Goodman says Phyllis Goodman just needs to do it.  Therapist asks if Phyllis Goodman lacks transportation and Phyllis Goodman says her mother will let her borrow her car but Phyllis Goodman just has not asked her. Phyllis Goodman says her take away from group is that Phyllis Goodman needs to go to meetings and get a sponsor.    Progress Towards Goals: New sobriety date of 06-20-23.   UDS collected: no  Results: Positive for Fentanyl  and Methamphetamines.   AA/NA attended?:  no  Sponsor?:  No  Earnie Gola, MS, LMFT, LCAS Will Hare, MA, LCSW, [facility], LCAS

## 2023-06-29 ENCOUNTER — Ambulatory Visit (HOSPITAL_COMMUNITY)
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
## 2023-07-02 ENCOUNTER — Ambulatory Visit (HOSPITAL_COMMUNITY)

## 2023-07-03 ENCOUNTER — Telehealth (HOSPITAL_COMMUNITY): Payer: Self-pay | Admitting: Licensed Clinical Social Worker

## 2023-07-03 NOTE — Telephone Encounter (Signed)
 The therapist returns Jaelyne's mothers phone call leaving a HIPAA compliant voicemail.  Melynda Stagger, MA, LCSW, [facility], LCAS 07/03/2023
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
## 2023-07-04 ENCOUNTER — Ambulatory Visit (HOSPITAL_COMMUNITY)

## 2023-07-04 ENCOUNTER — Telehealth (HOSPITAL_COMMUNITY): Payer: Self-pay

## 2023-07-04 NOTE — Telephone Encounter (Signed)
 Taylor's mother had left a message asking if she is supposed to bring Taylor to IOP this morning or wait until tomorrow's appointment. She asks that she be called and a message left. This therapist does not see a ROI for her mother, but calls Taylor's mother and left a message letting her know there is no ROI but that Carolynne Citron is aware of her appointment.  Earnie Gola, MS, LMFT, LCAS

## 2023-07-05 ENCOUNTER — Ambulatory Visit (HOSPITAL_COMMUNITY): Payer: MEDICAID | Admitting: Licensed Clinical Social Worker

## 2023-07-05 ENCOUNTER — Telehealth (HOSPITAL_COMMUNITY): Payer: Self-pay | Admitting: Licensed Clinical Social Worker

## 2023-07-05 NOTE — Telephone Encounter (Signed)
 As Phyllis Goodman does not show for her appointment today, the therapist attempts to reach her leaving a HIPAA compliant voicemail.  Melynda Stagger, MA, LCSW, [facility], LCAS 07/05/2023

## 2023-07-05 NOTE — Telephone Encounter (Signed)
 Note in error.

## 2023-07-06 ENCOUNTER — Ambulatory Visit (HOSPITAL_COMMUNITY)

## 2023-07-11 ENCOUNTER — Telehealth (HOSPITAL_COMMUNITY): Payer: Self-pay | Admitting: Licensed Clinical Social Worker

## 2023-07-11 ENCOUNTER — Telehealth (HOSPITAL_COMMUNITY): Payer: Self-pay

## 2023-07-11 ENCOUNTER — Encounter (HOSPITAL_COMMUNITY): Payer: Self-pay | Admitting: Licensed Clinical Social Worker

## 2023-07-11 NOTE — Telephone Encounter (Signed)
 Melynda Stagger, MA, LCSW, [facility], LCAS asks this therapist to call this pt at her mothers number and leave a HIPAA compliant message indicating he has openings for tomorrow if she wants to call. Bill provided the number to leave as [phone].  Earnie Gola, MS, LMFT, LCAS

## 2023-07-11 NOTE — Telephone Encounter (Signed)
 The therapist returns Aarica's call leaving a HIPAA compliant voicemail letting her know that he has schedule availability tomorrow and providing the direct contact number for the [address] office.   Melynda Stagger, MA, LCSW, [facility], LCAS 07/11/2023

## 2023-07-11 NOTE — Addendum Note (Signed)
 Addended by: Suellyn Emory on: 07/11/2023 05:27 PM   Modules accepted: Level of Service

## 2023-07-16 ENCOUNTER — Telehealth (HOSPITAL_COMMUNITY): Payer: Self-pay | Admitting: Licensed Clinical Social Worker

## 2023-07-16 NOTE — Telephone Encounter (Signed)
 The therapist returns a voicemail from Phyllis Goodman's mother asking that she have Phyllis Goodman call this therapist concerning any appointment confirmation she needs. Her mother indicated in the voicemail that she was trying to confirm Phyllis Goodman's appointment and that she could not get Phyllis Goodman to call this therapist back though Phyllis Goodman alleged that she had an appointment with this therapist this week.    Zell Maier, MA, LCSW, [facility], LCAS 07/16/2023

## 2023-07-17 ENCOUNTER — Telehealth (HOSPITAL_COMMUNITY): Payer: Self-pay

## 2023-07-17 ENCOUNTER — Telehealth (HOSPITAL_COMMUNITY): Payer: Self-pay | Admitting: Licensed Clinical Social Worker

## 2023-07-17 NOTE — Telephone Encounter (Signed)
 Pt calls Zell Maier and asks that Zell call her at [phone].This therapist sends a message to Zell informing him of the message.  Darice Simpler, MS, LFMT, LCAS

## 2023-07-17 NOTE — Telephone Encounter (Signed)
 Therapist returns Beauty's call leaving another HIPAA compliant voicemail.  Zell Maier, MA, LCSW, [facility], LCAS 07/17/2023

## 2023-07-18 ENCOUNTER — Telehealth (HOSPITAL_COMMUNITY): Payer: Self-pay | Admitting: Licensed Clinical Social Worker

## 2023-07-18 NOTE — Telephone Encounter (Signed)
 The therapist returns Phyllis Goodman's call leaving a HIPAA compliant voicemail confirming availability tomorrow and leaving the number for the scheduling desk.  Zell Maier, MA, LCSW, [facility], LCAS 07/18/2023

## 2023-07-19 ENCOUNTER — Ambulatory Visit (INDEPENDENT_AMBULATORY_CARE_PROVIDER_SITE_OTHER): Payer: MEDICAID | Admitting: Licensed Clinical Social Worker

## 2023-07-19 ENCOUNTER — Telehealth (HOSPITAL_COMMUNITY): Payer: Self-pay | Admitting: Licensed Clinical Social Worker

## 2023-07-19 DIAGNOSIS — F1520 Other stimulant dependence, uncomplicated: Secondary | ICD-10-CM

## 2023-07-19 DIAGNOSIS — F1120 Opioid dependence, uncomplicated: Secondary | ICD-10-CM

## 2023-07-19 DIAGNOSIS — Z8659 Personal history of other mental and behavioral disorders: Secondary | ICD-10-CM

## 2023-07-19 DIAGNOSIS — F4310 Post-traumatic stress disorder, unspecified: Secondary | ICD-10-CM
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
## 2023-07-19 NOTE — Telephone Encounter (Signed)
 The therapist returns Phyllis Goodman's call and verifies her identity via 2 identifiers.  She is scheduled to come and see this therapist today at 3 PM.  Zell Maier, MA, LCSW, [facility], LCAS 07/19/2023

## 2023-07-26 ENCOUNTER — Telehealth (HOSPITAL_COMMUNITY): Payer: Self-pay | Admitting: Licensed Clinical Social Worker

## 2023-07-26 NOTE — Telephone Encounter (Signed)
 Phyllis Goodman leaves a voicemail saying that her Suboxone  provider has yet to call her back but that she wants to restart SA IOP ASAP.  The therapist returns her call leaving a HIPAA-compliant voicemail.    Phyllis Maier, MA, LCSW, [facility], LCAS 07/26/2023

## 2023-07-30 ENCOUNTER — Telehealth (HOSPITAL_COMMUNITY): Payer: Self-pay | Admitting: Licensed Clinical Social Worker

## 2023-07-30 NOTE — Telephone Encounter (Signed)
 The therapist calls Phyllis Goodman as she presented today to do start paperwork for SA IOP; however, she was not on the schedule as she did not return the therapist's call from 07/26/23.  The therapist asks that she return a call to this therapist or Ms. Darice Simpler, LMFT, LCAS.  Zell Maier, MA, LCSW, [facility], LCAS 07/30/2023

## 2023-07-31 ENCOUNTER — Telehealth (HOSPITAL_COMMUNITY): Payer: Self-pay | Admitting: Licensed Clinical Social Worker

## 2023-07-31 NOTE — Telephone Encounter (Signed)
 The therapist returns Phyllis Goodman's call verifying her identity via two identifiers. She schedules to come to [facility] tomorrow at 1 p.m.to be reassessed for readmission to SA IOP.  Zell Maier, MA, LCSW, [facility], LCAS 07/31/2023

## 2023-08-01 ENCOUNTER — Ambulatory Visit (INDEPENDENT_AMBULATORY_CARE_PROVIDER_SITE_OTHER)

## 2023-08-01 DIAGNOSIS — F1520 Other stimulant dependence, uncomplicated: Secondary | ICD-10-CM

## 2023-08-01 DIAGNOSIS — F4310 Post-traumatic stress disorder, unspecified: Secondary | ICD-10-CM

## 2023-08-01 DIAGNOSIS — F1120 Opioid dependence, uncomplicated: Secondary | ICD-10-CM | POA: Diagnosis not present
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
## 2023-08-03 ENCOUNTER — Ambulatory Visit (INDEPENDENT_AMBULATORY_CARE_PROVIDER_SITE_OTHER): Payer: MEDICAID | Admitting: Licensed Clinical Social Worker

## 2023-08-03 ENCOUNTER — Telehealth (HOSPITAL_COMMUNITY): Payer: Self-pay | Admitting: Licensed Clinical Social Worker

## 2023-08-03 ENCOUNTER — Other Ambulatory Visit (HOSPITAL_COMMUNITY): Payer: Self-pay | Admitting: Medical

## 2023-08-03 DIAGNOSIS — F1120 Opioid dependence, uncomplicated: Secondary | ICD-10-CM

## 2023-08-03 DIAGNOSIS — F1520 Other stimulant dependence, uncomplicated: Secondary | ICD-10-CM

## 2023-08-03 DIAGNOSIS — Z8659 Personal history of other mental and behavioral disorders: Secondary | ICD-10-CM

## 2023-08-03 DIAGNOSIS — F4310 Post-traumatic stress disorder, unspecified: Secondary | ICD-10-CM

## 2023-08-03 NOTE — Telephone Encounter (Signed)
 The therapist leaves Ladene a HIPAA-compliant voicemail.  Zell Maier, MA, LCSW, [facility], LCAS
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
## 2023-08-04 DIAGNOSIS — Z419 Encounter for procedure for purposes other than remedying health state, unspecified: Secondary | ICD-10-CM | POA: Diagnosis not present

## 2023-08-06 ENCOUNTER — Ambulatory Visit (HOSPITAL_COMMUNITY): Payer: MEDICAID

## 2023-08-06 ENCOUNTER — Other Ambulatory Visit (HOSPITAL_COMMUNITY): Payer: Self-pay | Admitting: Medical

## 2023-08-08 ENCOUNTER — Ambulatory Visit (HOSPITAL_COMMUNITY): Payer: MEDICAID

## 2023-08-10 ENCOUNTER — Ambulatory Visit (HOSPITAL_COMMUNITY): Payer: MEDICAID

## 2023-08-13 ENCOUNTER — Ambulatory Visit (HOSPITAL_COMMUNITY): Payer: MEDICAID

## 2023-08-13 NOTE — Progress Notes (Unsigned)
 8:50 a.m. to 9 a.m.  Brandi presents today for group so this therapist meets with her briefly to inform her that she has again been discharged.  The therapist reminds her of the conditions that were discussed with her such that she could return to IOP.  Therapist informed her that she arrived extremely late for the one meeting that she did attend and then went on a 1 week vacation without calling either other group leaders to apprise them of this absence.  The therapist discloses his belief that her recovery would likely take precedence over a vacation given her recent urine drug screens.  She says that she has gotten connected with [facility] and agrees to call the receptionist at the [address] [facility] office to schedule an individual meeting with this therapist to talk about treatment recommendations and where to go from here.  Zell Maier, MA, LCSW, [facility], LCAS 08/13/2023

## 2023-08-14 ENCOUNTER — Ambulatory Visit (INDEPENDENT_AMBULATORY_CARE_PROVIDER_SITE_OTHER): Admitting: Licensed Clinical Social Worker

## 2023-08-14 DIAGNOSIS — F4310 Post-traumatic stress disorder, unspecified: Secondary | ICD-10-CM

## 2023-08-14 DIAGNOSIS — F1520 Other stimulant dependence, uncomplicated: Secondary | ICD-10-CM

## 2023-08-14 DIAGNOSIS — F1120 Opioid dependence, uncomplicated: Secondary | ICD-10-CM

## 2023-08-14 NOTE — Progress Notes (Signed)
 THERAPIST PROGRESS NOTE  Session Time: 3:20 p.m. to 4:38 p.m.   Type of Therapy: Individual   Therapist Response/Interventions: Solution-Focused/ The therapist talks to her about recommendations for sober living and a higher level of care and agrees to have a conjoint meeting with Phyllis Goodman and her mom next week to address her lack of progress and barriers to recovery and to hopefully find out who has custody of her children.   Treatment Goals addressed:  Active     Substance Use     Phyllis Goodman will abstain from using drugs per UDS while attending 12-step meetings no less than 3 times per week and obtaining a sponsor. (Not Progressing)     Start:  06/27/23    Expected End:  12/27/23         Urijah will have resolved the issue concerning her current living situation and her father's drinking as a trigger via family therapy and/or moving to a sober living situation. (Not Progressing)     Start:  06/27/23    Expected End:  12/27/23         The therapist will assist Phyllis Goodman in being able to identify and avoid triggers for using while assisting her in overcoming any obstacles or barriers and developing a sober support system.     Start:  06/27/23         The therapist will facilitate family sessions with Phyllis Goodman and her parents as needed.     Start:  06/27/23            Summary: Phyllis Goodman presents today admitting to having used some Meth this Sunday. The major focus of the session involves her lack of progress in large part due to continued contact with her children's father and her relationship with her alcoholic father.  She says that her father discovered she had the drugs and she was only able to appease him by agreeing to his putting cameras outside the house, etcetera to try and assure she is not getting drugs. The therapist reviews ASAM placement criteria with her indicating that she needs inpatient residential treatment and/or sober living such as an [address].  [facility] increased her  Suboxone  a little and she is scheduled to see [facility] hoping for them to take over the prescribing.   By session's end, Phyllis Goodman says that she wants her mom to come to a session next week to discuss her barriers to recovery and says that she wishes her mom would go to [place]. She says that her mom left her father once before but took him back. Her mom is dependent on Phyllis Goodman's father as he is the only one working and Phyllis Goodman expresses reservations about leaving her kids again.   She alleges that she is willing to get her children's father out of her life this time.     Progress Towards Goals: Not Progressing  Suicidal/Homicidal: No SI or HI  Plan: Phyllis Goodman will call her Suboxone  provider to request an increase in her Suboxone  and will talk with her mother to see if she can coordinate it such that she can return to [facility] and be able to arrive on time  Diagnosis: Severe opioid dependence on maintenance therapy; Methamphetamine Use Disorder, Severe; PTSD; and Hx or ADHD  Collaboration of Care: Other N/A  Patient/Guardian was advised Release of Information must be obtained prior to any record release in order to collaborate their care with an outside provider. Patient/Guardian was advised if they have not already done so  to contact the registration department to sign all necessary forms in order for us  to release information regarding their care.   Consent: Patient/Guardian gives verbal consent for treatment and assignment of benefits for services provided during this visit. Patient/Guardian expressed understanding and agreed to proceed.   Phyllis Maier, MA, LCSW, [facility], LCAS 08/14/2023

## 2023-08-15 ENCOUNTER — Ambulatory Visit (HOSPITAL_COMMUNITY): Payer: MEDICAID

## 2023-08-15 ENCOUNTER — Telehealth: Payer: Self-pay | Admitting: *Deleted

## 2023-08-15 DIAGNOSIS — F902 Attention-deficit hyperactivity disorder, combined type: Secondary | ICD-10-CM

## 2023-08-15 NOTE — Progress Notes (Unsigned)
 Complex Care Management Note Care Guide Note  08/15/2023 Name: Phyllis Goodman MRN: [id number] DOB: [birth year]   Complex Care Management Outreach Attempts: An unsuccessful telephone outreach was attempted today to offer the patient information about available complex care management services.  Follow Up Plan:  Additional outreach attempts will be made to offer the patient complex care management information and services.   Encounter Outcome:  No Answer  Thedford Franks, CMA [place]  [facility], [facility] Guide Direct Dial : [phone]  Fax: [phone] Website: [email or web address]

## 2023-08-16 DIAGNOSIS — Z3202 Encounter for pregnancy test, result negative: Secondary | ICD-10-CM | POA: Diagnosis not present

## 2023-08-16 DIAGNOSIS — Z1331 Encounter for screening for depression: Secondary | ICD-10-CM | POA: Diagnosis not present

## 2023-08-16 DIAGNOSIS — Z1339 Encounter for screening examination for other mental health and behavioral disorders: Secondary | ICD-10-CM | POA: Diagnosis not present

## 2023-08-16 NOTE — Progress Notes (Unsigned)
 Complex Care Management Note Care Guide Note  08/16/2023 Name: Phyllis Goodman MRN: [id number] DOB: May 30, 1995   Complex Care Management Outreach Attempts: A second unsuccessful outreach was attempted today to offer the patient with information about available complex care management services.  Follow Up Plan:  Additional outreach attempts will be made to offer the patient complex care management information and services.   Encounter Outcome:  No Answer  Thedford Franks, CMA [place]  [facility], [facility] Guide Direct Dial : [phone]  Fax: [phone] Website: [email or web address]

## 2023-08-17 ENCOUNTER — Ambulatory Visit (HOSPITAL_COMMUNITY): Payer: MEDICAID

## 2023-08-17 NOTE — Progress Notes (Signed)
 Complex Care Management Note Care Guide Note  08/17/2023 Name: Phyllis Goodman MRN: [id number] DOB: 08/30/1995   Complex Care Management Outreach Attempts: A third unsuccessful outreach was attempted today to offer the patient with information about available complex care management services.  Follow Up Plan:  No further outreach attempts will be made at this time. We have been unable to contact the patient to offer or enroll patient in complex care management services.  Encounter Outcome:  No Answer  Thedford Franks, CMA [place]  [facility], [facility] Guide Direct Dial : [phone]  Fax: [phone] Website: [email or web address]

## 2023-08-20 ENCOUNTER — Ambulatory Visit (HOSPITAL_COMMUNITY): Payer: MEDICAID

## 2023-08-20 DIAGNOSIS — Z79899 Other long term (current) drug therapy: Secondary | ICD-10-CM | POA: Diagnosis not present

## 2023-08-22 ENCOUNTER — Telehealth (HOSPITAL_COMMUNITY): Payer: Self-pay | Admitting: Licensed Clinical Social Worker

## 2023-08-22 ENCOUNTER — Ambulatory Visit (HOSPITAL_COMMUNITY): Payer: MEDICAID

## 2023-08-22 NOTE — Telephone Encounter (Signed)
 As Phyllis Goodman has arrived on two different occasions during group saying that she is to see this therapist and that the therapist is aware of it when in fact she has no appointment, the therapist attempts to reach her by phone leaving a HIPAA-compliant voicemail reminding her of her appointment with this therapist tomorrow at 2 p.m.  Zell Maier, MA, LCSW, [facility], LCAS 08/22/2023

## 2023-08-23 ENCOUNTER — Ambulatory Visit (HOSPITAL_COMMUNITY): Admitting: Licensed Clinical Social Worker

## 2023-08-23 DIAGNOSIS — Z79899 Other long term (current) drug therapy: Secondary | ICD-10-CM | POA: Diagnosis not present

## 2023-08-24 ENCOUNTER — Telehealth (HOSPITAL_COMMUNITY): Payer: Self-pay

## 2023-08-24 NOTE — Telephone Encounter (Signed)
 Front desk let this therapist know that Waddell was in the building and was last seen downstairs.  This therapist goes downstairs to speak to [facility] as she NS for her appointment with Zell Maier, LCSW, [facility], LCAS on 08-23-23.    Therapist speaks to [place] and inquired as to how she was doing. She reports she is fine.  Therapist asks if she has an appointment here and she answers no. Therapist invites her to this therapist office to talk, however Waddell declines. She says she is waiting for her mother to pick her up.  Therapist asks if she checked into the crisis unit. She says she was talking to front desk about whether she should go to detox. Therapist explains to Waddell that front desk are not trained therapistsand perhaps a therapist could better assist her with her contemplation on whether to go to detox.  Therapist asks Waddell if she will share what she has been using and she says just some stuff.     Therapist asks if she realizes she missed her scheduled therapy appointment with Zell Maier on yesterday but she does not reply.  Therapist tells Waddell if she changes her mind about wanting to speak to this therapist, she is free to come upstairs. Therapist encourages her if she has been using and needs to be evaluated for detox to please formally check in and be seen.  Darice Simpler, MS, LMFT, LCAS

## 2023-08-27 ENCOUNTER — Ambulatory Visit (INDEPENDENT_AMBULATORY_CARE_PROVIDER_SITE_OTHER): Admitting: Family Medicine

## 2023-08-27 ENCOUNTER — Encounter: Payer: Self-pay | Admitting: Family Medicine

## 2023-08-27 ENCOUNTER — Ambulatory Visit: Payer: Self-pay | Admitting: Family Medicine

## 2023-08-27 VITALS — BP 118/73 | HR 77 | Temp 98.2°F | Resp 16 | Ht 67.0 in | Wt 161.5 lb

## 2023-08-27 DIAGNOSIS — F32A Depression, unspecified: Secondary | ICD-10-CM | POA: Diagnosis not present

## 2023-08-27 DIAGNOSIS — F419 Anxiety disorder, unspecified: Secondary | ICD-10-CM | POA: Diagnosis not present

## 2023-08-27 DIAGNOSIS — Z0000 Encounter for general adult medical examination without abnormal findings: Secondary | ICD-10-CM | POA: Diagnosis not present

## 2023-08-27 LAB — CBC WITH DIFFERENTIAL/PLATELET
Basophils Absolute: 0.1 K/uL (ref 0.0–0.1)
Basophils Relative: 1 % (ref 0.0–3.0)
Eosinophils Absolute: 0.6 K/uL (ref 0.0–0.7)
Eosinophils Relative: 10.6 % — ABNORMAL HIGH (ref 0.0–5.0)
HCT: 37.7 % (ref 36.0–46.0)
Hemoglobin: 12.9 g/dL (ref 12.0–15.0)
Lymphocytes Relative: 33.4 % (ref 12.0–46.0)
Lymphs Abs: 1.8 K/uL (ref 0.7–4.0)
MCHC: 34.2 g/dL (ref 30.0–36.0)
MCV: 89.9 fl (ref 78.0–100.0)
Monocytes Absolute: 0.5 K/uL (ref 0.1–1.0)
Monocytes Relative: 9 % (ref 3.0–12.0)
Neutro Abs: 2.5 K/uL (ref 1.4–7.7)
Neutrophils Relative %: 46 % (ref 43.0–77.0)
Platelets: 323 K/uL (ref 150.0–400.0)
RBC: 4.19 Mil/uL (ref 3.87–5.11)
RDW: 13.8 % (ref 11.5–15.5)
WBC: 5.4 K/uL (ref 4.0–10.5)

## 2023-08-27 LAB — COMPREHENSIVE METABOLIC PANEL WITH GFR
ALT: 15 U/L (ref 0–35)
AST: 24 U/L (ref 0–37)
Albumin: 4.1 g/dL (ref 3.5–5.2)
Alkaline Phosphatase: 88 U/L (ref 39–117)
BUN: 11 mg/dL (ref 6–23)
CO2: 29 meq/L (ref 19–32)
Calcium: 9.2 mg/dL (ref 8.4–10.5)
Chloride: 102 meq/L (ref 96–112)
Creatinine, Ser: 0.63 mg/dL (ref 0.40–1.20)
GFR: 119.57 mL/min (ref >60.00)
Glucose, Bld: 101 mg/dL — ABNORMAL HIGH (ref 70–99)
Potassium: 4.2 meq/L (ref 3.5–5.1)
Sodium: 138 meq/L (ref 135–145)
Total Bilirubin: 0.4 mg/dL (ref 0.2–1.2)
Total Protein: 6.7 g/dL (ref 6.0–8.3)

## 2023-08-27 LAB — LIPID PANEL
Cholesterol: 131 mg/dL (ref 0–200)
HDL: 56.6 mg/dL (ref >39.00)
LDL Cholesterol: 60 mg/dL (ref 0–99)
NonHDL: 74.58
Total CHOL/HDL Ratio: 2
Triglycerides: 73 mg/dL (ref 0.0–149.0)
VLDL: 14.6 mg/dL (ref 0.0–40.0)

## 2023-08-27 LAB — HEMOGLOBIN A1C: Hgb A1c MFr Bld: 5.7 % (ref 4.6–6.5)

## 2023-08-27 LAB — TSH: TSH: 1.24 u[IU]/mL (ref 0.35–5.50)

## 2023-08-27 MED ORDER — TRAZODONE HCL 50 MG PO TABS: 50.0000 mg | ORAL_TABLET | Freq: Every evening | ORAL | 0 refills | Status: AC | PRN

## 2023-08-27 MED ORDER — BUPROPION HCL ER (SR) 150 MG PO TB12: 150.0000 mg | ORAL_TABLET | Freq: Two times a day (BID) | ORAL | 0 refills | Status: DC

## 2023-08-27 NOTE — Progress Notes (Signed)
 Phone [phone]   Subjective:   Patient is a 28 y.o. female presenting for annual physical.    Chief Complaint  Patient presents with   Annual Exam    CPE Not fasting Discuss Wellbutrin  refill   Annual Discussed the use of AI scribe software for clinical note transcription with the patient, who gave verbal consent to proceed.  History of Present Illness Phyllis Goodman is a 28 year old female who presents for an annual physical exam.  She has a history of substance use disorder and was hospitalized in January for addiction. She is currently in recovery, abstaining from drugs, and actively participating in psychiatry and counseling. She takes Wellbutrin , one tablet in the morning and one after dinner, and trazodone  50 mg for sleep, though not nightly. She is unsure of Wellbutrin 's efficacy but notes no increase in anxiety. No SI  A few weeks ago, she experienced a severe migraine characterized by throbbing pain and nausea, lasting a couple of days. This was a new occurrence and not linked to her menstrual cycle. She has had headaches before but never of this intensity.  She reports allergies and nasal congestion but has not been taking any medication for it, although she has Claritin available.  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 She is not on birth control and is not currently seeking any. She smokes a vape occasionally, having reduced her usage from previously smoking a pack of cigarettes a day.  No thoughts of suicide, chest pain, heart racing, coughing, shortness of breath, vomiting, diarrhea, constipation, heartburn, trouble urinating, muscle aches, or joint pains. Reports allergies and stuffiness.    See problem oriented charting- ROS- ROS: Gen: no fever, chills  Skin: no rash, itching ENT:+congestion Eyes: no blurry vision, double vision Resp: no cough, wheeze,SOB CV: no CP, palpitations, LE edema,  GI: no heartburn, n/v/d/c, abd  pain GU: no dysuria, urgency, frequency, hematuria MSK: no joint pain, myalgias, back pain Neuro: no dizziness,, weakness, vertigo.  Ha 2 wks ago Psych: no depression, anxiety, insomnia, SI   The following were reviewed and entered/updated in epic: Past Medical History:  Diagnosis Date   ADHD (attention deficit hyperactivity disorder)    Allergy    History of endocarditis    History of pulmonary embolism    d/t endocarditis   Patient Active Problem List   Diagnosis Date Noted   Methamphetamine use disorder, moderate (HCC) 04/04/2023   Methamphetamine use disorder, moderate, dependence (HCC) 04/03/2023   History of endocarditis 03/20/2022   VBAC, delivered 09/20/2019   PPH (postpartum hemorrhage) 09/20/2019   Post-dates pregnancy 09/19/2019   Group beta Strep positive 08/22/2019   History of cesarean section 06/26/2019   Prior pregnancy with congenital cardiac defect, antepartum 06/26/2019   Rh negative state in antepartum period 06/17/2019   Supervision of other normal pregnancy, antepartum 05/14/2019   History of drug abuse (HCC) 07/28/2015   ADHD (attention deficit hyperactivity disorder), combined type 06/28/2015   Dysgraphia 06/28/2015   Past Surgical History:  Procedure Laterality Date   CESAREAN SECTION     NO PAST SURGERIES      Family History  Problem Relation Age of Onset   Hyperlipidemia Father    Diabetes Father    Alcohol  abuse Father     Medications- reviewed and updated Current Outpatient Medications  Medication Sig Dispense Refill   buprenorphine -naloxone  (SUBOXONE ) 2-0.5 mg SUBL SL tablet 1 tablet 2 (two) times daily.     Multiple Vitamin (  MULTIVITAMIN WITH MINERALS) TABS tablet Take 1 tablet by mouth daily.     buPROPion  ER (WELLBUTRIN  SR) 150 MG 12 hr tablet Take 1 tablet (150 mg total) by mouth 2 (two) times daily. 180 tablet 0   traZODone  (DESYREL ) 50 MG tablet Take 1 tablet (50 mg total) by mouth at bedtime as needed. 90 tablet 0   No current  facility-administered medications for this visit.    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 congested  NECK:  supple, no thyromegaly, no nodes, no carotid bruits CARDIAC: RRR, S1S2+, no murmur. DP 2+B LUNGS: CTAB. No wheezes ABDOMEN:  BS+, soft, NTND, No HSM, no masses EXT:  no edema MSK: no gross abnormalities. MS 5/5 all 4 NEURO: A&O x3.  CN II-XII intact.  PSYCH: normal mood. fair eye contact   Skin-a lot of poc marks     Assessment and Plan   Health Maintenance counseling: 1. Anticipatory guidance: Patient counseled regarding regular dental exams q6 months, eye exams,  avoiding smoking and second hand smoke, limiting alcohol  to 1 beverage per day, no illicit drugs.   2. Risk factor reduction:  Advised patient of need for regular exercise and diet rich and fruits and vegetables to reduce risk of heart attack and stroke. Exercise- encouraged.  Wt Readings from Last 3 Encounters:  08/27/23 161 lb 8 oz (73.3 kg)  04/16/23 147 lb (66.7 kg)  11/21/22 149 lb 12.8 oz (67.9 kg)   3. Immunizations/screenings/ancillary studies Immunization History  Administered Date(s) Administered   Dtap, Unspecified 01/03/1994, 03/13/1994, 05/18/1994, 03/14/1995, 09/01/1998   HIB, Unspecified 01/03/1994, 03/13/1994, 05/18/1994, 11/09/1994   HPV Quadrivalent 08/16/2006, 10/23/2006, 03/04/2007   Hep B, Unspecified 12/08/1993, 05/18/1994   Influenza, Seasonal, Injecte, Preservative Fre 02/14/2016, 11/21/2022   Influenza,inj,Quad PF,6+ Mos 03/20/2022   MMR 03/14/1995, 09/01/1998    PFIZER(Purple Top)SARS-COV-2 Vaccination 01/23/2022, 03/24/2022   Polio, Unspecified 03/13/1994, 05/18/1994, 01/04/1995, 09/01/1998   Tdap 08/16/2006, 06/12/2016, 06/26/2019, 09/21/2019   Varicella 11/09/1994, 08/16/2006   Health Maintenance Due  Topic Date Due   Hepatitis B Vaccines (1 of 3 - 19+ 3-dose series) Never done    4. Cervical cancer screening: utd 5. Skin cancer screening- advised regular sunscreen use. Denies worrisome, changing, or new skin lesions.  6. Birth control/STD check: not sa 7. Smoking associated screening: + smoker 8. Alcohol  screening: neg  Wellness examination -     CBC with Differential/Platelet -     Comprehensive metabolic panel with GFR -     Lipid panel -     TSH -     Hemoglobin A1c  Anxiety and depression -     buPROPion  HCl ER (SR); Take 1 tablet (150 mg total) by mouth 2 (two) times daily.  Dispense: 180 tablet; Refill: 0 -     traZODone  HCl; Take 1 tablet (50 mg total) by mouth at bedtime as needed.  Dispense: 90 tablet; Refill: 0   Wellness-anticipatory guidance.  Work on Diet/Exercise  Check CBC,CMP,lipids,TSH, A1C.  F/u 1 yr  Assessment and Plan Assessment & Plan Adult Wellness Visit   Annual physical examination completed with discussions on medication management, mental health, and lifestyle habits. Perform blood work for cholesterol and other routine labs.   Opioid use disorder, in remission   Opioid use disorder remains in remission. She attends counseling and group therapy. No current suicidal ideation.   Major depressive disorder and anxiety disorder   She is on Wellbutrin  and Trazodone  with stable mood and anxiety levels but feels meds not helping. Genetic testing supports Wellbutrin  as suitable per pt. No current suicidal ideation. Increase Wellbutrin  to 150 mg twice daily  continue trazodone  50mg  at hs prn  Migraine, new onset   New onset migraine with severe throbbing pain and nausea resolved after a few days. No clear trigger  identified, not related to menstrual cycle. Monitor for recurrence and track symptoms.  Allergic rhinitis   Increased nasal congestion and stuffiness likely due to allergic rhinitis. Not taking allergy medication regularly. Take Claritin as needed for symptoms.  Nicotine  dependence, vaping   She smokes a vape but has reduced usage compared to previous cigarette smoking. Acknowledges the need to quit but is focusing on other health priorities. Encourage reduction and eventual cessation of vaping.    Recommended follow up: Return in about 3 months (around 11/27/2023) for mood.  Lab/Order associations:not fasting   Jenkins CHRISTELLA Carrel, MD

## 2023-08-27 NOTE — Patient Instructions (Signed)
 It was very nice to see you today!  Increase wellbutrin  to 150mg  twice daily  Take the claritin   PLEASE NOTE:  If you had any lab tests please let us  know if you have not heard back within a few days. You may see your results on MyChart before we have a chance to review them but we will give you a call once they are reviewed by us . If we ordered any referrals today, please let us  know if you have not heard from their office within the next week.   Please try these tips to maintain a healthy lifestyle:  Eat most of your calories during the day when you are active. Eliminate processed foods including packaged sweets (pies, cakes, cookies), reduce intake of potatoes, white bread, white pasta, and white rice. Look for whole grain options, oat flour or almond flour.  Each meal should contain half fruits/vegetables, one quarter protein, and one quarter carbs (no bigger than a computer mouse).  Cut down on sweet beverages. This includes juice, soda, and sweet tea. Also watch fruit intake, though this is a healthier sweet option, it still contains natural sugar! Limit to 3 servings daily.  Drink at least 1 glass of water with each meal and aim for at least 8 glasses per day  Exercise at least 150 minutes every week.

## 2023-08-27 NOTE — Progress Notes (Signed)
 Labs ok except eosinophils high which could be allergies

## 2023-08-28 ENCOUNTER — Ambulatory Visit (HOSPITAL_COMMUNITY): Admitting: Licensed Clinical Social Worker

## 2023-08-28 ENCOUNTER — Encounter (HOSPITAL_COMMUNITY): Payer: Self-pay

## 2023-08-28 ENCOUNTER — Telehealth (HOSPITAL_COMMUNITY): Payer: Self-pay | Admitting: Licensed Clinical Social Worker

## 2023-08-28 NOTE — Telephone Encounter (Signed)
 The therapist leaves Phyllis Goodman a HIPAA-compliant voicemail notifying her of her missed appointment today and that she will need to call and speak directly with this therapist prior to scheduling any additional appointments.  Zell Maier, MA, LCSW, [facility], LCAS 08/28/2023

## 2023-08-30 DIAGNOSIS — Z79899 Other long term (current) drug therapy: Secondary | ICD-10-CM | POA: Diagnosis not present

## 2023-09-04 DIAGNOSIS — Z419 Encounter for procedure for purposes other than remedying health state, unspecified: Secondary | ICD-10-CM | POA: Diagnosis not present

## 2023-09-05 ENCOUNTER — Telehealth (HOSPITAL_COMMUNITY): Payer: Self-pay | Admitting: Licensed Clinical Social Worker

## 2023-09-05 NOTE — Telephone Encounter (Signed)
 Patrecia's mother leaves a voicemail indicating that Laneice was supposed to meet with Daniele after class today suggesting that Jayleen has not told her parents that she was discharged from [facility].  Her mom says that she was calling to let this therapist know that she found Kratom in Yalda's room and that Taressa is not taking her Suboxone  as prescribed.   The therapist has no ROI to speak with her mother.  Zell Maier, MA, LCSW, [facility], LCAS 09/05/2023

## 2023-09-13 ENCOUNTER — Encounter: Payer: Self-pay | Admitting: Medical

## 2023-09-21 ENCOUNTER — Telehealth (HOSPITAL_COMMUNITY): Payer: Self-pay | Admitting: Licensed Clinical Social Worker

## 2023-09-21 NOTE — Telephone Encounter (Signed)
 Viveka's mother leaves a voicemail saying that Amour told her that this therapist says that she does not have to listen to what Cyla's father tells her to do.  She says that Liandra complains that they are suffocating her: However, mother feels like Babette is a ball and chain and that she end Carloyn's father are supporting An's 3 children.  Additionally, she says that Rama has been sneaking out and smuggling things to her children's father over the fence and vice versa.  He also says that Taylerr has stopped taking her Suboxone .  Her mother questions if Lylla does not need some form of inpatient treatment.  The therapist returns her call leaving a HIPAA-compliant voicemail informing her mother that he could meet with her today at noon and address her concerns if her mother could have Jamieka sign a ROI giving this therapist permission to speak with her.  Zell Maier, MA, LCSW, [facility], LCAS 09/21/2023

## 2023-10-04 DIAGNOSIS — F17210 Nicotine dependence, cigarettes, uncomplicated: Secondary | ICD-10-CM | POA: Diagnosis not present

## 2023-10-04 DIAGNOSIS — Z79899 Other long term (current) drug therapy: Secondary | ICD-10-CM | POA: Diagnosis not present

## 2023-10-05 DIAGNOSIS — Z419 Encounter for procedure for purposes other than remedying health state, unspecified: Secondary | ICD-10-CM | POA: Diagnosis not present

## 2023-10-08 ENCOUNTER — Telehealth (HOSPITAL_COMMUNITY): Payer: Self-pay | Admitting: Licensed Clinical Social Worker

## 2023-10-08 NOTE — Telephone Encounter (Signed)
 The therapist receives a voicemail from Nyhla's mother saying that Phyllis Goodman is up all night crying one minute and laughing the next and hitting herself on her arms. Her mother says that Phyllis Goodman says IOP is working though her mom does not believe that it is and wants this therapist to find her an inpatient treatment facility noting that she might have to put her out of the house.  The therapist returns Phyllis Goodman's mother's call leaving a HIPAA-compliant voicemail again asking that she see if she can get Phyllis Goodman to sign a ROI giving permission for this therapist to speak with her and offering to meet with them both today in person if Phyllis Goodman would come in today with her mother.  Last week, the therapist observed Phyllis Goodman sitting outside the [employer] office talking on the phone leading this therapist to suspect that Phyllis Goodman likely told her parents that she was meeting with this therapist for an individual session similar to her apparently leading her parents to believe that she is still attending [facility].   Zell Maier, MA, LCSW, [facility], LCAS 10/08/2023

## 2023-10-09 ENCOUNTER — Ambulatory Visit: Payer: Self-pay

## 2023-10-09 ENCOUNTER — Encounter: Payer: Self-pay | Admitting: Family Medicine

## 2023-10-09 ENCOUNTER — Ambulatory Visit (INDEPENDENT_AMBULATORY_CARE_PROVIDER_SITE_OTHER): Admitting: Family Medicine

## 2023-10-09 VITALS — BP 114/75 | HR 94 | Temp 97.5°F | Ht 67.0 in | Wt 168.2 lb

## 2023-10-09 DIAGNOSIS — R600 Localized edema: Secondary | ICD-10-CM

## 2023-10-09 DIAGNOSIS — Z23 Encounter for immunization: Secondary | ICD-10-CM

## 2023-10-09 LAB — CBC
HCT: 35.9 % — ABNORMAL LOW (ref 36.0–46.0)
Hemoglobin: 12.3 g/dL (ref 12.0–15.0)
MCHC: 34.3 g/dL (ref 30.0–36.0)
MCV: 90.7 fl (ref 78.0–100.0)
Platelets: 259 K/uL (ref 150.0–400.0)
RBC: 3.96 Mil/uL (ref 3.87–5.11)
RDW: 13.3 % (ref 11.5–15.5)
WBC: 5.6 K/uL (ref 4.0–10.5)

## 2023-10-09 LAB — TSH: TSH: 0.93 u[IU]/mL (ref 0.35–5.50)

## 2023-10-09 MED ORDER — FUROSEMIDE 20 MG PO TABS: 20.0000 mg | ORAL_TABLET | Freq: Every day | ORAL | 3 refills | Status: AC

## 2023-10-09 NOTE — Telephone Encounter (Signed)
 Appt today

## 2023-10-09 NOTE — Telephone Encounter (Signed)
 FYI Only or Action Required?: FYI only for provider.  Patient was last seen in primary care on 08/27/2023 by Wendolyn Jenkins Jansky, MD.  Called Nurse Triage reporting Leg Swelling.  Symptoms began several days ago.  Interventions attempted: Rest, hydration, or home remedies.  Symptoms are: gradually worsening.  Triage Disposition: See Physician Within 24 Hours  Patient/caregiver understands and will follow disposition?: Yes     Copied from CRM [id number]. Topic: Clinical - Red Word Triage >> Oct 09, 2023  8:29 AM Suzen RAMAN wrote: Red Word that prompted transfer to Nurse Triage:  calf  and ankle extreme swollen Reason for Disposition  [1] MODERATE leg swelling (e.g., swelling extends up to knees) AND [2] new-onset or getting worse  Answer Assessment - Initial Assessment Questions Patient states she has elevated feet and swelling hasn't relieved. Patient wants to know she also is taking Suboxone .   1. ONSET: When did the swelling start? (e.g., minutes, hours, days)     A few days 2. LOCATION: What part of the leg is swollen?  Are both legs swollen or just one leg?     Calf and ankle 3. SEVERITY: How bad is the swelling? (e.g., localized; mild, moderate, severe)     Left leg is more swollen than right. Patient describes left leg as moderate.  4. REDNESS: Is there redness or signs of infection?     Patient states she noticed a purple tint to her left leg on Sunday 5. PAIN: Is the swelling painful to touch? If Yes, ask: How painful is it?   (Scale 1-10; mild, moderate or severe)     Hurts to walk 6. FEVER: Do you have a fever? If Yes, ask: What is it, how was it measured, and when did it start?      Not that patient knows of 7. CAUSE: What do you think is causing the leg swelling?     Patient is not sure the cause. Denies major travel or surgery. Patient states Friday and Saturday she worked 12 hours each day and on her feet the whole time. 8. MEDICAL HISTORY: Do  you have a history of blood clots (e.g., DVT), cancer, heart failure, kidney disease, or liver failure?     Patient states she was hospitalized for something with her heart and blood infection a few years ago but does not know of any major history 9. RECURRENT SYMPTOM: Have you had leg swelling before? If Yes, ask: When was the last time? What happened that time?     Patient states she has had it before years ago when in the hospital a few years ago 10. OTHER SYMPTOMS: Do you have any other symptoms? (e.g., chest pain, difficulty breathing)       No 11. PREGNANCY: Is there any chance you are pregnant? When was your last menstrual period?       No  Protocols used: Leg Swelling and Edema-A-AH

## 2023-10-09 NOTE — Progress Notes (Signed)
   Phyllis Goodman is a 28 y.o. female who presents today for an office visit.  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  Low suspicion for cardiac etiology given her lack of other red flag signs or symptoms and normal cardiopulmonary exam today.  Also low suspicion for VTE-Wells score is 1.    We discussed conservative measures including leg elevation and salt avoidance.  Will start low-dose Lasix  20 mg daily for the next couple of days due to her degree of edema as well.  We discussed potential side effects and also discussed that this would be short-term only.  We will check labs today including CBC, c-Met, TSH, and D-dimer.  If D-dimer is positive we will need to get ultrasound.  If labs are negative and symptoms persist would consider repeat echocardiogram at that time.  We discussed reasons to return to care.  Flu vaccine given today.    Subjective:  HPI:  See assessment / plan for status of chronic conditions.   Discussed the use of AI scribe software for clinical note transcription with the patient, who gave verbal consent to proceed.  History of Present Illness Phyllis Goodman is a 28 year old female with a history of endocarditis who presents with bilateral leg swelling.  She has experienced bilateral leg swelling for four days, with the left leg more affected than the right. The swelling is primarily located in the calves and has not improved with rest, leg elevation, or ibuprofen . No recent injuries, long car rides, or plane travel.  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 Since then, she has attempted to minimize time on her feet.  No  shortness of breath.  No chest pain.  No orthopnea  No chest pain, shortness of breath, fever, chills, or recent infections. She has a history of endocarditis and previous blood clots, for which she was treated with blood thinners.  She is currently taking Suboxone  and has been managing stress-related skin picking, resulting in scabs on her body.         Objective:  Physical Exam: BP 114/75   Pulse 94   Temp (!) 97.5 F (36.4 C) (Temporal)   Ht 5' 7 (1.702 m)   Wt 168 lb 3.2 oz (76.3 kg)   LMP 09/23/2023   SpO2 96%   BMI 26.34 kg/m   Wt Readings from Last 3 Encounters:  10/09/23 168 lb 3.2 oz (76.3 kg)  08/27/23 161 lb 8 oz (73.3 kg)  11/21/22 149 lb 12.8 oz (67.9 kg)    Gen: No acute distress, resting comfortably CV: Regular rate and rhythm with no murmurs appreciated Pulm: Normal work of breathing, clear to auscultation bilaterally with no crackles, wheezes, or rhonchi MUSCULOSKELETAL: Excoriations noted on lower extremities.  Bilateral 2+ edema to knees bilaterally with left worse than right.  No varicosities.  Neurovascular intact distally Neuro: Grossly normal, moves all extremities Psych: Normal affect and thought content      Ameriah Lint M. Kennyth, MD 10/09/2023 11:59 AM

## 2023-10-09 NOTE — Patient Instructions (Addendum)
 It was very nice to see you today!  VISIT SUMMARY: You came in today because of swelling in both of your legs, which has been more noticeable in your left leg. We discussed your history of endocarditis and previous blood clots, and I have ordered some tests to investigate further. We also talked about your current medication, Suboxone , which is not related to your leg swelling.  YOUR PLAN: BILATERAL LOWER LEG EDEMA: You have swelling in both of your legs, more in the left than the right. -Order blood tests to check thyroid , kidney, liver function, and D-dimer levels. -Prescribe Lasix  to help reduce the swelling. -Reduce salt intake, especially on work days. -Consider an echocardiogram if blood work is normal and symptoms persist.  OPIOID DEPENDENCE ON MEDICATION ASSISTED THERAPY: You are taking Suboxone  for opioid dependence. -Continue taking Suboxone  as prescribed.  Return if symptoms worsen or fail to improve.   Take care, Dr Kennyth  PLEASE NOTE:  If you had any lab tests, please let us  know if you have not heard back within a few days. You may see your results on mychart before we have a chance to review them but we will give you a call once they are reviewed by us .   If we ordered any referrals today, please let us  know if you have not heard from their office within the next week.   If you had any urgent prescriptions sent in today, please check with the pharmacy within an hour of our visit to make sure the prescription was transmitted appropriately.   Please try these tips to maintain a healthy lifestyle:  Eat at least 3 REAL meals and 1-2 snacks per day.  Aim for no more than 5 hours between eating.  If you eat breakfast, please do so within one hour of getting up.   Each meal should contain half fruits/vegetables, one quarter protein, and one quarter carbs (no bigger than a computer mouse)  Cut down on sweet beverages. This includes juice, soda, and sweet tea.   Drink at  least 1 glass of water with each meal and aim for at least 8 glasses per day  Exercise at least 150 minutes every week.

## 2023-10-10 ENCOUNTER — Ambulatory Visit: Payer: Self-pay | Admitting: Family Medicine

## 2023-10-10 ENCOUNTER — Telehealth (HOSPITAL_COMMUNITY): Payer: Self-pay | Admitting: Licensed Clinical Social Worker

## 2023-10-10 LAB — D-DIMER, QUANTITATIVE: D-Dimer, Quant: 0.24 ug{FEU}/mL (ref <0.50)

## 2023-10-10 NOTE — Telephone Encounter (Signed)
 Arshia's mother leaves a voicemail saying that she has been waiting an hour for Makyiah to pick her up as Debborah told her that she has a [facility] appointment which she does not. Her mom says that she cannot wait any longer and will have to leave her.  Zell Maier, MA, LCSW, [facility], LCAS 10/10/2023

## 2023-10-10 NOTE — Progress Notes (Signed)
 Her blood work is all reassuring.  No obvious cause for her leg swelling based on labs.  Thyroid  level is normal.  Her test for blood clot was negative.  Kidney and liver functions were normal and she is not anemic.  She should let us  know if her leg swelling is not improving over the next few days per our discussion at her most recent office visit here.

## 2023-10-13 LAB — COMPREHENSIVE METABOLIC PANEL WITH GFR
ALT: 15 U/L (ref 0–35)
AST: 29 U/L (ref 0–37)
Albumin: 3.8 g/dL (ref 3.5–5.2)
Alkaline Phosphatase: 72 U/L (ref 39–117)
BUN: 13 mg/dL (ref 6–23)
CO2: 27 meq/L (ref 19–32)
Calcium: 8.9 mg/dL (ref 8.4–10.5)
Chloride: 108 meq/L (ref 96–112)
Creatinine, Ser: 0.68 mg/dL (ref 0.40–1.20)
GFR: 117.29 mL/min (ref >60.00)
Glucose, Bld: 101 mg/dL — ABNORMAL HIGH (ref 70–99)
Potassium: 4.2 meq/L (ref 3.5–5.1)
Sodium: 139 meq/L (ref 135–145)
Total Bilirubin: 0.4 mg/dL (ref 0.2–1.2)
Total Protein: 6.2 g/dL (ref 6.0–8.3)

## 2023-10-18 ENCOUNTER — Telehealth (HOSPITAL_COMMUNITY): Payer: Self-pay

## 2023-10-18 NOTE — Telephone Encounter (Signed)
 Taylor's mother calls and leaves a message. She says that Waddell is now out of the house. Mother says they could not continue having her live there and behaving the way she did.  Mother says she knows Waddell is using drugs and is not in treatment any longer.  Mother asks that the [facility] clinicians stop her from contacting the baby father who she gets drugs from. She is also requesting the therapists get her into inpatient treatment and also find her a place to live.  Darice Simpler, MS, LMFT, LCAS

## 2023-11-04 DIAGNOSIS — Z419 Encounter for procedure for purposes other than remedying health state, unspecified: Secondary | ICD-10-CM | POA: Diagnosis not present

## 2023-11-20 ENCOUNTER — Other Ambulatory Visit: Payer: Self-pay | Admitting: Family Medicine

## 2023-11-20 DIAGNOSIS — F419 Anxiety disorder, unspecified: Secondary | ICD-10-CM

## 2023-11-27 ENCOUNTER — Ambulatory Visit: Admitting: Family Medicine

## 2023-11-28 ENCOUNTER — Encounter: Payer: Self-pay | Admitting: Family Medicine
